# Patient Record
Sex: Male | Born: 1960 | Race: Black or African American | Hispanic: No | Marital: Single | Smoking: Former smoker
Health system: Southern US, Community
[De-identification: ages and names within clinical notes are randomized; demographics above are authoritative.]

## PROBLEM LIST (undated history)

## (undated) DIAGNOSIS — K644 Residual hemorrhoidal skin tags: Secondary | ICD-10-CM

## (undated) DIAGNOSIS — Z9189 Other specified personal risk factors, not elsewhere classified: Secondary | ICD-10-CM

## (undated) DIAGNOSIS — C61 Malignant neoplasm of prostate: Secondary | ICD-10-CM

## (undated) DIAGNOSIS — J45990 Exercise induced bronchospasm: Secondary | ICD-10-CM

## (undated) DIAGNOSIS — Z8614 Personal history of Methicillin resistant Staphylococcus aureus infection: Secondary | ICD-10-CM

## (undated) DIAGNOSIS — J309 Allergic rhinitis, unspecified: Secondary | ICD-10-CM

## (undated) DIAGNOSIS — E119 Type 2 diabetes mellitus without complications: Secondary | ICD-10-CM

## (undated) DIAGNOSIS — K219 Gastro-esophageal reflux disease without esophagitis: Secondary | ICD-10-CM

## (undated) DIAGNOSIS — M1990 Unspecified osteoarthritis, unspecified site: Secondary | ICD-10-CM

## (undated) HISTORY — PX: SHOULDER ARTHROSCOPY: SHX128

## (undated) HISTORY — PX: FOOT SURGERY: SHX648

## (undated) HISTORY — PX: INGUINAL HERNIA REPAIR: SUR1180

---

## 1999-04-20 ENCOUNTER — Inpatient Hospital Stay (HOSPITAL_COMMUNITY): Admission: EM | Admit: 1999-04-20 | Discharge: 1999-04-22 | Payer: Self-pay | Admitting: Emergency Medicine

## 1999-04-20 ENCOUNTER — Encounter: Payer: Self-pay | Admitting: Orthopedic Surgery

## 1999-04-20 ENCOUNTER — Encounter: Payer: Self-pay | Admitting: Emergency Medicine

## 2000-05-14 ENCOUNTER — Emergency Department (HOSPITAL_COMMUNITY): Admission: EM | Admit: 2000-05-14 | Discharge: 2000-05-14 | Payer: Self-pay | Admitting: Emergency Medicine

## 2001-11-26 ENCOUNTER — Encounter: Payer: Self-pay | Admitting: Emergency Medicine

## 2001-11-26 ENCOUNTER — Emergency Department (HOSPITAL_COMMUNITY): Admission: EM | Admit: 2001-11-26 | Discharge: 2001-11-26 | Payer: Self-pay | Admitting: Emergency Medicine

## 2002-01-17 ENCOUNTER — Emergency Department (HOSPITAL_COMMUNITY): Admission: EM | Admit: 2002-01-17 | Discharge: 2002-01-17 | Payer: Self-pay | Admitting: Emergency Medicine

## 2002-07-08 ENCOUNTER — Emergency Department (HOSPITAL_COMMUNITY): Admission: EM | Admit: 2002-07-08 | Discharge: 2002-07-08 | Payer: Self-pay | Admitting: *Deleted

## 2004-03-06 ENCOUNTER — Emergency Department (HOSPITAL_COMMUNITY): Admission: EM | Admit: 2004-03-06 | Discharge: 2004-03-06 | Payer: Self-pay | Admitting: Emergency Medicine

## 2006-02-23 ENCOUNTER — Emergency Department (HOSPITAL_COMMUNITY): Admission: EM | Admit: 2006-02-23 | Discharge: 2006-02-23 | Payer: Self-pay | Admitting: Emergency Medicine

## 2006-04-02 ENCOUNTER — Emergency Department (HOSPITAL_COMMUNITY): Admission: EM | Admit: 2006-04-02 | Discharge: 2006-04-02 | Payer: Self-pay | Admitting: Emergency Medicine

## 2007-08-19 ENCOUNTER — Emergency Department (HOSPITAL_COMMUNITY): Admission: EM | Admit: 2007-08-19 | Discharge: 2007-08-19 | Payer: Self-pay | Admitting: Emergency Medicine

## 2008-08-10 ENCOUNTER — Emergency Department (HOSPITAL_COMMUNITY): Admission: EM | Admit: 2008-08-10 | Discharge: 2008-08-10 | Payer: Self-pay | Admitting: Emergency Medicine

## 2008-08-24 ENCOUNTER — Emergency Department (HOSPITAL_COMMUNITY): Admission: EM | Admit: 2008-08-24 | Discharge: 2008-08-24 | Payer: Self-pay | Admitting: Emergency Medicine

## 2009-04-10 ENCOUNTER — Emergency Department (HOSPITAL_COMMUNITY): Admission: EM | Admit: 2009-04-10 | Discharge: 2009-04-10 | Payer: Self-pay | Admitting: Family Medicine

## 2009-05-07 ENCOUNTER — Ambulatory Visit: Payer: Self-pay | Admitting: Physician Assistant

## 2009-05-07 DIAGNOSIS — R0989 Other specified symptoms and signs involving the circulatory and respiratory systems: Secondary | ICD-10-CM

## 2009-05-07 DIAGNOSIS — R0609 Other forms of dyspnea: Secondary | ICD-10-CM

## 2009-05-07 DIAGNOSIS — K625 Hemorrhage of anus and rectum: Secondary | ICD-10-CM | POA: Insufficient documentation

## 2009-05-07 DIAGNOSIS — K439 Ventral hernia without obstruction or gangrene: Secondary | ICD-10-CM | POA: Insufficient documentation

## 2009-05-07 LAB — CONVERTED CEMR LAB: OCCULT 1: NEGATIVE

## 2009-05-08 ENCOUNTER — Encounter: Payer: Self-pay | Admitting: Physician Assistant

## 2009-05-08 LAB — CONVERTED CEMR LAB
ALT: 51 units/L (ref 0–53)
AST: 33 units/L (ref 0–37)
Albumin: 4.4 g/dL (ref 3.5–5.2)
Alkaline Phosphatase: 61 units/L (ref 39–117)
Amphetamine Screen, Ur: NEGATIVE
Barbiturate Quant, Ur: NEGATIVE
Basophils Absolute: 0 10*3/uL (ref 0.0–0.1)
Basophils Relative: 0 % (ref 0–1)
Cocaine Metabolites: NEGATIVE
Glucose, Bld: 93 mg/dL (ref 70–99)
Hemoglobin: 15.3 g/dL (ref 13.0–17.0)
Lymphocytes Relative: 40 % (ref 12–46)
Marijuana Metabolite: NEGATIVE
Monocytes Absolute: 0.7 10*3/uL (ref 0.1–1.0)
Neutro Abs: 4 10*3/uL (ref 1.7–7.7)
Neutrophils Relative %: 49 % (ref 43–77)
Opiate Screen, Urine: NEGATIVE
Phencyclidine (PCP): NEGATIVE
Potassium: 4.2 meq/L (ref 3.5–5.3)
RDW: 12.4 % (ref 11.5–15.5)
Sodium: 139 meq/L (ref 135–145)
Total Bilirubin: 0.4 mg/dL (ref 0.3–1.2)
Total Protein: 7.2 g/dL (ref 6.0–8.3)

## 2009-05-11 ENCOUNTER — Encounter: Payer: Self-pay | Admitting: Physician Assistant

## 2009-05-11 ENCOUNTER — Ambulatory Visit (HOSPITAL_COMMUNITY): Admission: RE | Admit: 2009-05-11 | Discharge: 2009-05-11 | Payer: Self-pay | Admitting: Internal Medicine

## 2009-05-28 ENCOUNTER — Ambulatory Visit: Payer: Self-pay | Admitting: Physician Assistant

## 2009-06-06 ENCOUNTER — Encounter (INDEPENDENT_AMBULATORY_CARE_PROVIDER_SITE_OTHER): Payer: Self-pay | Admitting: Internal Medicine

## 2009-06-06 ENCOUNTER — Ambulatory Visit: Payer: Self-pay | Admitting: Cardiovascular Disease

## 2009-06-06 ENCOUNTER — Ambulatory Visit (HOSPITAL_COMMUNITY): Admission: RE | Admit: 2009-06-06 | Discharge: 2009-06-06 | Payer: Self-pay | Admitting: Internal Medicine

## 2009-06-06 HISTORY — PX: TRANSTHORACIC ECHOCARDIOGRAM: SHX275

## 2009-06-07 ENCOUNTER — Encounter: Payer: Self-pay | Admitting: Physician Assistant

## 2009-06-07 ENCOUNTER — Telehealth: Payer: Self-pay | Admitting: Physician Assistant

## 2009-06-12 ENCOUNTER — Encounter (INDEPENDENT_AMBULATORY_CARE_PROVIDER_SITE_OTHER): Payer: Self-pay | Admitting: *Deleted

## 2009-06-22 ENCOUNTER — Encounter: Payer: Self-pay | Admitting: Physician Assistant

## 2009-08-02 ENCOUNTER — Encounter: Payer: Self-pay | Admitting: Physician Assistant

## 2009-08-02 ENCOUNTER — Ambulatory Visit (HOSPITAL_COMMUNITY): Admission: RE | Admit: 2009-08-02 | Discharge: 2009-08-02 | Payer: Self-pay | Admitting: Gastroenterology

## 2009-08-06 ENCOUNTER — Ambulatory Visit: Payer: Self-pay | Admitting: Physician Assistant

## 2009-08-06 DIAGNOSIS — E119 Type 2 diabetes mellitus without complications: Secondary | ICD-10-CM

## 2009-08-06 DIAGNOSIS — R81 Glycosuria: Secondary | ICD-10-CM | POA: Insufficient documentation

## 2009-08-06 LAB — CONVERTED CEMR LAB
Blood in Urine, dipstick: NEGATIVE
Hgb A1c MFr Bld: 7.1 %
Ketones, urine, test strip: NEGATIVE
Nitrite: NEGATIVE
Protein, U semiquant: NEGATIVE
Specific Gravity, Urine: >=1.03
WBC Urine, dipstick: NEGATIVE

## 2009-08-08 LAB — CONVERTED CEMR LAB
Cholesterol: 151 mg/dL (ref 0–200)
Microalb, Ur: 1.12 mg/dL (ref 0.00–1.89)
Total CHOL/HDL Ratio: 2.9
Triglycerides: 82 mg/dL (ref <150)
VLDL: 16 mg/dL (ref 0–40)

## 2009-08-21 ENCOUNTER — Ambulatory Visit: Payer: Self-pay | Admitting: Physician Assistant

## 2009-09-10 ENCOUNTER — Encounter: Payer: Self-pay | Admitting: Physician Assistant

## 2009-09-13 ENCOUNTER — Ambulatory Visit: Payer: Self-pay | Admitting: Physician Assistant

## 2009-09-17 ENCOUNTER — Telehealth: Payer: Self-pay | Admitting: Physician Assistant

## 2009-09-17 ENCOUNTER — Ambulatory Visit: Payer: Self-pay | Admitting: Physician Assistant

## 2009-09-18 DIAGNOSIS — R972 Elevated prostate specific antigen [PSA]: Secondary | ICD-10-CM

## 2009-09-18 LAB — CONVERTED CEMR LAB: PSA: 4.55 ng/mL — ABNORMAL HIGH (ref 0.10–4.00)

## 2009-11-01 ENCOUNTER — Telehealth: Payer: Self-pay | Admitting: Physician Assistant

## 2009-11-06 ENCOUNTER — Ambulatory Visit: Payer: Self-pay | Admitting: Physician Assistant

## 2009-11-06 DIAGNOSIS — R030 Elevated blood-pressure reading, without diagnosis of hypertension: Secondary | ICD-10-CM

## 2009-11-06 DIAGNOSIS — B3749 Other urogenital candidiasis: Secondary | ICD-10-CM

## 2009-11-06 DIAGNOSIS — B351 Tinea unguium: Secondary | ICD-10-CM | POA: Insufficient documentation

## 2009-11-06 LAB — CONVERTED CEMR LAB
Bilirubin Urine: NEGATIVE
Glucose, Urine, Semiquant: NEGATIVE
Ketones, urine, test strip: NEGATIVE
Specific Gravity, Urine: 1.025
Urobilinogen, UA: 0.2
pH: 5.5

## 2009-11-07 LAB — CONVERTED CEMR LAB
Calcium: 9.4 mg/dL (ref 8.4–10.5)
GC Probe Amp, Urine: NEGATIVE
Hgb A1c MFr Bld: 7 % — ABNORMAL HIGH (ref <5.7)
PSA: 4.52 ng/mL — ABNORMAL HIGH (ref 0.10–4.00)
Potassium: 4.4 meq/L (ref 3.5–5.3)
Sodium: 138 meq/L (ref 135–145)

## 2009-11-09 ENCOUNTER — Encounter: Payer: Self-pay | Admitting: Physician Assistant

## 2010-01-04 ENCOUNTER — Ambulatory Visit: Payer: Self-pay | Admitting: Internal Medicine

## 2010-01-07 ENCOUNTER — Ambulatory Visit: Payer: Self-pay | Admitting: Internal Medicine

## 2010-01-21 ENCOUNTER — Encounter (INDEPENDENT_AMBULATORY_CARE_PROVIDER_SITE_OTHER): Payer: Self-pay | Admitting: Nurse Practitioner

## 2010-03-04 ENCOUNTER — Ambulatory Visit: Admit: 2010-03-04 | Payer: Self-pay | Admitting: Nurse Practitioner

## 2010-03-28 NOTE — Letter (Signed)
Summary: Handout Printed  Printed Handout:  - Diabetes, Adult, Type II

## 2010-03-28 NOTE — Op Note (Signed)
Summary: Operative Report  Operative Report   Imported By: Arta Bruce 09/11/2009 15:38:04  _____________________________________________________________________  External Attachment:    Type:   Image     Comment:   External Document

## 2010-03-28 NOTE — Letter (Signed)
Summary: PFT'S  PFT'S   Imported By: Arta Bruce 05/29/2009 11:50:08  _____________________________________________________________________  External Attachment:    Type:   Image     Comment:   External Document

## 2010-03-28 NOTE — Letter (Signed)
Summary: *HSN Results Follow up  [facility]  [address] [place], [place] [address]   Phone: [phone]  Fax: [phone]      05/08/2009   Cristian Welch [address] [place], [place]  [address]   Dear  [name],                            ____S.Drinkard,FNP   ____D. Gore,FNP       ____B. McPherson,MD   ____V. Rankins,MD    ____E. Mulberry,MD    ____N. Daphine Deutscher, FNP  ____D. Reche Dixon, MD    ____K. Philipp Deputy, MD    __x__S. Alben Spittle, PA-C     This letter is to inform you that your recent test(s):  _______Pap Smear    ___x____Lab Test     _______X-ray    ___x____ is within acceptable limits  _______ requires a medication change  _______ requires a follow-up lab visit  _______ requires a follow-up visit with your provider   Comments:       _________________________________________________________ If you have any questions, please contact our office                     Sincerely,  Tereso Newcomer PA-C [facility]

## 2010-03-28 NOTE — Miscellaneous (Signed)
  Clinical Lists Changes  Observations: Added new observation of DIAB EYE EX: Retasure Normal (09/13/2009 14:57)

## 2010-03-28 NOTE — Letter (Signed)
Summary: PODIATRY  PODIATRY   Imported By: Arta Bruce 03/12/2010 10:46:14  _____________________________________________________________________  External Attachment:    Type:   Image     Comment:   External Document

## 2010-03-28 NOTE — Progress Notes (Signed)
Summary: UROLOGY REFERRAL   Phone Note Outgoing Call   Summary of Call: I SPOKE TO Cristian Welch ABOUT HIS APPT 11-14-09 @ 1:30PM [facility] . PT SAID THAT HE DON'T HAVE A TRANSPORTATION TO GO THERE AND HE IS NOT GOING . PT HAVE AN APPT WITH YOU 11-06-09 @ 10 AM CAN YOU PLEASE DISCUSS THAT WITH HIM AND LET ME KNOW SO I CAN CANCELED HIS APPT THANK YOU  Initial call taken by: Cheryll Dessert,  November 01, 2009 3:50 PM  Follow-up for Phone Call        [place], Please tell him about:  PART. This is a bus system that goes between [place], [place]. He can probably get to [facility] with this. If you type PART in to [employer], it should come up. I recommend he call and speak to someone to figure out how to get there. Tereso Newcomer PA-C  November 02, 2009 1:12 PM   Additional Follow-up for Phone Call Additional follow up Details #1::        pt is aware and will get information Additional Follow-up by: [name],  November 02, 2009 2:10 PM

## 2010-03-28 NOTE — Letter (Signed)
Summary: NUTRITION SUMMARY//SUSIE  NUTRITION SUMMARY//SUSIE   Imported By: Arta Bruce 09/03/2009 16:39:14  _____________________________________________________________________  External Attachment:    Type:   Image     Comment:   External Document

## 2010-03-28 NOTE — Progress Notes (Signed)
Summary: Office Visit//DEPRESSION SCREENING  Office Visit//DEPRESSION SCREENING   Imported By: Arta Bruce 08/14/2009 12:19:02  _____________________________________________________________________  External Attachment:    Type:   Image     Comment:   External Document

## 2010-03-28 NOTE — Letter (Signed)
Summary: Handout Printed  Printed Handout:  - Diet - High-Fiber 

## 2010-03-28 NOTE — Progress Notes (Signed)
Summary: Cardiology Referral  Phone Note Outgoing Call   Summary of Call: Patient needs referral to cardiology for dyspnea with exertion. Initial call taken by: Tereso Newcomer PA-C,  June 07, 2009 1:44 PM       Impression & Recommendations:  Problem # 1:  DYSPNEA ON EXERTION (ICD-786.09)  His updated medication list for this problem includes:    Proventil Hfa 108 (90 Base) Mcg/act Aers (Albuterol sulfate) .Marland Kitchen... 1-2 puffs every 4-6 hours as needed  Orders: Cardiology Referral (Cardiology)  Complete Medication List: 1)  Proventil Hfa 108 (90 Base) Mcg/act Aers (Albuterol sulfate) .Marland Kitchen.. 1-2 puffs every 4-6 hours as needed 2)  Proctosol Hc 2.5 % Crea (Hydrocortisone) .... Apply two times a day after bowel movement as needed

## 2010-03-28 NOTE — Assessment & Plan Note (Signed)
Summary: Physical exam///gk   Vital Signs:  Patient profile:   50 year old male Height:      69 inches Weight:      244 pounds Temp:     97.8 degrees F oral Pulse rate:   77 / minute Pulse rhythm:   regular Resp:     20 per minute BP sitting:   125 / 86  (left arm) Cuff size:   large  Vitals Entered By: [name] (August 06, 2009 11:17 AM) CC: Patient has had colonscopy, patient went to appt. with Card.  Is Patient Diabetic? No Pain Assessment Patient in pain? no       Does patient need assistance? Functional Status Self care Ambulation Normal   Primary Care Provider:  Tereso Newcomer PA-C  CC:  Patient has had colonscopy and patient went to appt. with Card. .  History of Present Illness: Here for CPE.  Rectal bleeding:  Had colo last week.  It was normal.  Dyspnea:  Saw cardiologist last month.  Says he had stress test.  Sounds like he had a nuclear study.  No records received.  Sounds like it was normal.  Of note, had normal PFTs, cxr and Echo as part of his workup.  Suspect deconditioning and weight gain.   Health Maint: Just had colo as above. Needs lipids checked. Discussed PSA and he wants checked.  No urinary c/o's at this time. PHQ9=0 today. Notes exposure to the sun.  Problems Prior to Update: 1)  Diabetes Mellitus, Type II  (ICD-250.00) 2)  Glycosuria  (ICD-791.5) 3)  Preventive Health Care  (ICD-V70.0) 4)  Ventral Hernia  (ICD-553.20) 5)  Rectal Bleeding  (ICD-569.3) 6)  Dyspnea On Exertion  (ICD-786.09) 7)  Family History Diabetes 1st Degree Relative  (ICD-V18.0)  Allergies: No Known Drug Allergies  Past History:  Past Medical History: unremarkable PFTs 05/11/2009:  FEV1 89; FEV1/FVC 78 % ("Normal airflows.  No significant response to bronchodilators") Echo 05/2009:  EF 55-60% Colo 07/2009: normal Stress test 06/2009 (results unkown 08/06/2009)  Family History: Family History Diabetes 1st degree relative - mom  Bro and Sis with Cancer No  CAD in family. Uncle had Prostate cancer  Social History: Reviewed history from 05/07/2009 and no changes required. 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 CV:  Denies chest pain or discomfort and shortness of breath with exertion. Resp:  Denies cough. GI:  Denies bloody stools and dark tarry stools. GU:  Denies hematuria, nocturia, and urinary frequency. MS:  Denies joint pain. Neuro:  Denies headaches. Psych:  Denies suicidal thoughts/plans. Endo:  Denies excessive urination.  Physical Exam  General:  alert, well-developed, and well-nourished.   Head:  normocephalic and atraumatic.   Eyes:  pupils equal, pupils round, pupils reactive to light, and no optic disk abnormalities.   Ears:  R ear normal and L ear normal.   Nose:  no external deformity.   Mouth:  pharynx pink and moist.   Neck:  supple, no carotid bruits, and no cervical lymphadenopathy.   Chest Wall:  no deformities.   Lungs:  normal breath sounds, no crackles, and no wheezes.   Heart:  normal rate, regular rhythm, and no murmur.   Abdomen:  soft, non-tender, normal  bowel sounds, and no hepatomegaly.   Rectal:  no external abnormalities, no hemorrhoids, normal sphincter tone, no masses, no tenderness, no fissures, no fistulae, and no perianal rash.   Genitalia:  uncircumcised, no hydrocele, no varicocele, no scrotal masses, no testicular masses or atrophy, no cutaneous lesions, and no urethral discharge.   Prostate:  no nodules, no asymmetry, no induration, and 1+ enlarged.   Msk:  normal ROM.   Extremities:  no edema Neurologic:  alert & oriented X3 and cranial nerves II-XII intact.   Skin:  turgor normal.   Psych:  normally interactive and good eye contact.     Impression &  Recommendations:  Problem # 1:  GLYCOSURIA (ICD-791.5)  Orders: Capillary Blood Glucose/CBG (04540) Hemoglobin A1C (98119)  Problem # 2:  DIABETES MELLITUS, TYPE II (ICD-250.00)  new dx discussed complications of diabetes and indications for treatment handout given all questions answered suggest he start metformin now will start metformin 500 mg once daily  arrange appt with dietician schedule retasure  f/u in 3 months  His updated medication list for this problem includes:    Metformin Hcl 500 Mg Xr24h-tab (Metformin hcl) .Marland Kitchen... Take 1 tablet by mouth once a day for diabetes  Orders: Hemoglobin A1C (83036) T-Urine Microalbumin w/creat. ratio [phone])  Problem # 3:  PREVENTIVE HEALTH CARE (ICD-V70.0) check lipids - goal LDL < 100 check PSA PHQ9=0  Orders: T-Lipid Profile (0011001100) T-PSA (57846-96295)  Problem # 4:  DYSPNEA ON EXERTION (ICD-786.09) workup completed no pulmonary or cardiac issue contributing suggest he increase activity  His updated medication list for this problem includes:    Proventil Hfa 108 (90 Base) Mcg/act Aers (Albuterol sulfate) .Marland Kitchen... 1-2 puffs every 4-6 hours as needed  Complete Medication List: 1)  Proventil Hfa 108 (90 Base) Mcg/act Aers (Albuterol sulfate) .Marland Kitchen.. 1-2 puffs every 4-6 hours as needed 2)  Proctosol Hc 2.5 % Crea (Hydrocortisone) .... Apply two times a day after bowel movement as needed 3)  Metformin Hcl 500 Mg Xr24h-tab (Metformin hcl) .... Take 1 tablet by mouth once a day for diabetes 4)  Glucose Meter of Choice  .... Check sugar once daily 5)  Glucose Meter Strips  .... Check sugar once daily 6)  Lancets  .... Check sugar once daily  Patient Instructions: 1)  Call about flu shot in  2)  Start Metformin for diabetes.  Take once daily with food. 3)  Schedule appointment with Susie Piper (new diabetic). 4)  Get glucose meter at [facility]. pharmacy. 5)  Check sugar once a day before breakfast.   Check if you feel bad (nauseated, sweaty, heart racing).  This could mean your sugar is low.  If low (60-70 or lower), eat something sweet and recheck 20 minutes later. 6)  Schedule retasure at [facility]. 7)  Please schedule a follow-up appointment in 3 months with Ronen Bromwell for diabetes.  Prescriptions: LANCETS check sugar once daily  #1 mo supply x 11   Entered and Authorized by:   Tereso Newcomer PA-C   Signed by:   Tereso Newcomer PA-C on 08/06/2009   Method used:   Print then Give to Patient   RxID:   [id number] GLUCOSE METER STRIPS check sugar once daily  #1 mo supply x 11   Entered and Authorized by:   Tereso Newcomer PA-C   Signed by:   Tereso Newcomer PA-C on 08/06/2009   Method used:   Print then Give to Patient   RxID:   [id number] GLUCOSE  METER OF CHOICE check sugar once daily  #1 machine x 0   Entered and Authorized by:   Tereso Newcomer PA-C   Signed by:   Tereso Newcomer PA-C on 08/06/2009   Method used:   Print then Give to Patient   RxID:   [id number] METFORMIN HCL 500 MG XR24H-TAB (METFORMIN HCL) Take 1 tablet by mouth once a day for diabetes  #30 x 5   Entered and Authorized by:   Tereso Newcomer PA-C   Signed by:   Tereso Newcomer PA-C on 08/06/2009   Method used:   Print then Give to Patient   RxID:   [id number]   Laboratory Results   Urine Tests    Routine Urinalysis   Color: yellow Appearance: Clear Glucose: 100   (Normal Range: Negative) Bilirubin: negative   (Normal Range: Negative) Ketone: negative   (Normal Range: Negative) Spec. Gravity: >=1.030   (Normal Range: 1.003-1.035) Blood: negative   (Normal Range: Negative) pH: 5.0   (Normal Range: 5.0-8.0) Protein: negative   (Normal Range: Negative) Urobilinogen: 0.2   (Normal Range: 0-1) Nitrite: negative   (Normal Range: Negative) Leukocyte Esterace: negative   (Normal Range: Negative)     Blood Tests     Glucose (random): 78 mg/dL   (Normal Range: 42-595) HGBA1C: 7.1%   (Normal Range:  Non-Diabetic - 3-6%   Control Diabetic - 6-8%)       Colonoscopy  Procedure date:  08/02/2009  Findings:       Results: Normal. Location:  [facility].  Dr. Evette Cristal   Comments:      Repeat colonoscopy in 10 years.

## 2010-03-28 NOTE — Assessment & Plan Note (Signed)
Summary: first est care/ hernia/ shortness of breath/gk   Vital Signs:  Patient profile:   50 year old male Height:      69 inches Weight:      251 pounds BMI:     37.20 Temp:     97.9 degrees F oral Pulse rate:   93 / minute Pulse rhythm:   regular Resp:     18 per minute BP sitting:   125 / 84  (left arm) Cuff size:   regular  Vitals Entered By: [name] (May 07, 2009 3:15 PM) CC: NP...Marland KitchenMarland Kitchen pt says he thinks he has a ulcer...pt says he gets SOB when he does little things especially if he has to been down and he thinks thats related to the ucler he has in stomach.. . pt wants a cpe.... Is Patient Diabetic? No Pain Assessment Patient in pain? no       Does patient need assistance? Functional Status Self care Ambulation Normal   Primary Care Provider:  Tereso Newcomer PA-C  CC:  NP...Marland KitchenMarland Kitchen pt says he thinks he has a ulcer...pt says he gets SOB when he does little things especially if he has to been down and he thinks thats related to the ucler he has in stomach.. . pt wants a cpe.....  History of Present Illness: New patient.  Hernia:  Noted ventral hernia 4-6 mos ago.  Seems to be getting worse.  No nausea or vomiting.  No difficulty in passing stools.  + flatus  SOB:  Noted over last 6 mos or so.  Gets short of breath and has to stop after walking.  NO chest pain.  NO arm or jaw pain.  No diaph.  NO nausea.  No PND or orthopnea.  No edema.  No syncope.  He has a remote h/o smoking.  Also, h/o crack cocaine abuse.  Does note wheezing.  No significant cough.  No sputum production.    BRBPR:  Has hemorrhoids.  Notes burning and itching.  Has scant amount of blood on tissue at times with this.  Never had a colonoscopy.  NO melena.  No gross hematochezia.   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 No dysuria or penile lesions. Rest of ROS neg.  Physical Exam  General:  alert, well-developed, and well-nourished.   Head:  normocephalic and atraumatic.   Eyes:  pupils equal, pupils round, and pupils reactive to light.   Neck:  supple, no thyromegaly, no JVD, no carotid bruits, and no cervical lymphadenopathy.   Lungs:  normal breath sounds, no crackles, and no wheezes.   Heart:  normal rate, regular rhythm, and no murmur.   Abdomen:  soft, non-tender, and no hepatomegaly.   ventral hernia noted  Rectal:  normal sphincter tone, no masses, no tenderness, no fissures, no fistulae,  no perianal rash, and external hemorrhoid(s).   Prostate:  no gland enlargement, no nodules, no asymmetry, and no induration.   Extremities:  no edema Neurologic:  alert & oriented X3 and cranial nerves II-XII intact.   Skin:  turgor normal.   Psych:  normally interactive and not anxious appearing.     Impression & Recommendations:  Problem # 1:  Preventive Health Care (ICD-V70.0)  PHQ9=5 . . . when asked patient states he is worried about health; not depressed; denies suicidal ideations; does not want to see counselor  Orders: T-CBC w/Diff [phone]) T-Comprehensive Metabolic Panel [id number]) T-TSH ([phone]) T-Drug Screen-Urine, (single) [phone]) T-HIV Antibody  (Reflex) (28413-24401) T-Syphilis Test (RPR) (02725-36644)  Problem # 2:  DYSPNEA ON EXERTION (ICD-786.09)  ? etiology poss  COPD cannot exclude cardiac ischemia . .  .will check ECG check cxr check echo check PFTs trial of proventil as needed close f/u  His updated medication list for this problem includes:    Proventil Hfa 108 (90 Base) Mcg/act Aers (Albuterol sulfate) .Marland Kitchen... 1-2 puffs every 4-6 hours as needed  Orders: EKG w/ Interpretation (93000) Diagnostic X-Ray/Fluoroscopy (Diagnostic X-Ray/Flu) PFT Baseline-Pre/Post Bronchodiolator (PFT Baseline-Pre/Pos) 2 D Echo (2 D Echo) T-Comprehensive Metabolic Panel ([id number]) T-TSH (38756-43329) T-BNP  (B Natriuretic Peptide) (51884-16606)  Problem # 3:  RECTAL BLEEDING (ICD-569.3)  heme neg today has hemorrhoids on exam and likely cause of noted blood patient is 48 and should have colo done will give proctosol for hemorrhoids and refer to GI for screening colo  Orders: Hemoccult Guaiac-1 spec.(in office) (82270) T-CBC w/Diff (30160-10932) Gastroenterology Referral (GI)  Problem # 4:  VENTRAL HERNIA (ICD-553.20) focus on other issues for now if he desires repair in future, consider making referral  Complete Medication List: 1)  Proventil Hfa 108 (90 Base) Mcg/act Aers (Albuterol sulfate) .Marland Kitchen.. 1-2 puffs every 4-6 hours as needed 2)  Proctosol Hc 2.5 % Crea (Hydrocortisone) .... Apply two times a day after bowel movement as needed  Patient Instructions: 1)  Please schedule a follow-up appointment in 3 weeks with Tereka Thorley to follow up on tests.  Prescriptions: PROCTOSOL HC 2.5 % CREA (HYDROCORTISONE) apply two times a day after bowel movement as needed  #1 box x 3   Entered and Authorized by:   Tereso Newcomer PA-C   Signed by:   Tereso Newcomer PA-C on 05/07/2009   Method used:   Print then Give to Patient   RxID:   [id number] PROVENTIL HFA 108 (90 BASE) MCG/ACT AERS (ALBUTEROL SULFATE) 1-2 puffs every 4-6 hours as needed  #1 x 3   Entered and Authorized by:   Tereso Newcomer PA-C   Signed by:   Tereso Newcomer PA-C on 05/07/2009   Method used:    Print then Give to Patient   RxID:   [id number]   Laboratory Results    Stool - Occult Blood Hemmoccult #1: negative Date: 05/07/2009     EKG  Procedure date:  05/07/2009  Findings:      Normal sinus rhythm with rate of:  89 normal axis no isch changes

## 2010-03-28 NOTE — Assessment & Plan Note (Signed)
Summary: 3 week fu////kt   Vital Signs:  Patient profile:   50 year old male Weight:      251.25 pounds BMI:     37.24 Temp:     98.3 degrees F Pulse rate:   91 / minute Pulse rhythm:   regular Resp:     20 per minute BP sitting:   125 / 87  (left arm) Cuff size:   regular  Vitals Entered By: Chauncy Passy, SMA CC: Pt. is here f/u for a 3 week f/u.  Is Patient Diabetic? No Pain Assessment Patient in pain? no       Does patient need assistance? Functional Status Self care Ambulation Normal   Primary Care Provider:  Tereso Newcomer PA-C  CC:  Pt. is here f/u for a 3 week f/u. Marland Kitchen  History of Present Illness: Here for f/u.  Rectal bleeding:  Saw Dr. Evette Cristal.  Has colo arranged in June.  Has seen a little more bleeding since last visit.  Just on tissue.  Notes blood when he strains.  Dyspnea:  Started 6 mos ago.  Notes DOE.  NYHA Class 2.  No orthopnea or PND.  Has occ chest pain unrelated to exertion.  No jaw or arm pain.  No syncope.  No nausea or diaphoresis. Never go CXR or echo done. PFTs with normal airflows and no response to bronchodilator. Exsmoker.  No FHx of CAD that he knows of.   Current Medications (verified): 1)  Proventil Hfa 108 (90 Base) Mcg/act Aers (Albuterol Sulfate) .Marland Kitchen.. 1-2 Puffs Every 4-6 Hours As Needed 2)  Proctosol Hc 2.5 % Crea (Hydrocortisone) .... Apply Two Times A Day After Bowel Movement As Needed  Allergies (verified): No Known Drug Allergies  Past History:  Past Medical History: unremarkable PFTs 05/11/2009:  FEV1 89; FEV1/FVC 78 % ("Normal airflows.  No significant response to bronchodilators")  Family History: Family History Diabetes 1st degree relative - mom  Bro and Sis with Cancer No CAD in family.  Physical Exam  General:  alert, well-developed, and well-nourished.   Head:  normocephalic and atraumatic.   Neck:  supple.   Lungs:  normal breath sounds.   Heart:  normal rate and regular rhythm.   Abdomen:  soft.     Extremities:  no edema  Neurologic:  alert & oriented X3 and cranial nerves II-XII intact.   Psych:  normally interactive.     Impression & Recommendations:  Problem # 1:  DYSPNEA ON EXERTION (ICD-786.09) PFTs normal I was surprised by this given his h/o smoking I suspect his DOE is related to his wt gain CRFs are minimal but he has a significant h/o smoking WIll send to card to consider ETT to r/o ischemia as a cause Echo and CXR still pending  . . . pt did not know to get them done never got inhaler with normal PFTs, may not need it however, he can have on hand for poss of mild reactive airway  His updated medication list for this problem includes:    Proventil Hfa 108 (90 Base) Mcg/act Aers (Albuterol sulfate) .Marland Kitchen... 1-2 puffs every 4-6 hours as needed  Problem # 2:  RECTAL BLEEDING (ICD-569.3) colo pending  Complete Medication List: 1)  Proventil Hfa 108 (90 Base) Mcg/act Aers (Albuterol sulfate) .Marland Kitchen.. 1-2 puffs every 4-6 hours as needed 2)  Proctosol Hc 2.5 % Crea (Hydrocortisone) .... Apply two times a day after bowel movement as needed  Patient Instructions: 1)  Please get the  chest xray and echocardiogram done. 2)  Eat more fiber. 3)  See the handout. 4)  Someone will call you to set up a visit with the heart doctor for your shortness of breath. 5)  Td shot today. 6)  Schedule follow up with Bobi Daudelin in 2 months for CPE. 7)  Come fasting for labs (nothing to eat or drink after midnight except water). 8)  Make sure you go to billing at [facility] to sign up for your discount.

## 2010-03-28 NOTE — Progress Notes (Signed)
Summary: medical ?  Phone Note Call from Patient   Summary of Call: Pt has been working outside lately and the heat has been really bothering him and he wonders if it is a side effect of any of his meds.  Pt can be reached at [phone]. Initial call taken by: Vesta Mixer CMA,  September 17, 2009 9:57 AM  Follow-up for Phone Call        The only thing I can see that he should be aware of with his medicines is that he could get into trouble with Metformin if he is severely dehydrated (like heat exhaustion or heat stroke).   If he feels poorly and starts to have a lot of muscle pain while working in the heat, he should go to the emergency room right away.  Otherwise, he should take frequent breaks (every 15 minutes) to cool off and drink plenty of water throughout the day.  Drink water when he rests every 15 minutes.  He does have diabetes and can potentially have more problems if his sugars are staying up or dropping too low.  Make sure he is eating regularly.  Schedule appt with me if he is concerned. Follow-up by: Tereso Newcomer PA-C,  September 17, 2009 10:10 AM  Additional Follow-up for Phone Call Additional follow up Details #1::        pt is aware Additional Follow-up by: [name],  September 17, 2009 4:36 PM

## 2010-03-28 NOTE — Miscellaneous (Signed)
Summary: colo done 6.2011   Clinical Lists Changes  Problems: Assessed RECTAL BLEEDING as comment only - colo in 6.2011 normal redness noted in anorectal jxn bleeding may have been related to irritation in this area Observations: Added new observation of PAST MED HX: unremarkable PFTs 05/11/2009:  FEV1 89; FEV1/FVC 78 % ("Normal airflows.  No significant response to bronchodilators") Echo 05/2009:  EF 55-60% Colo 07/2009: normal Stress test 06/2009 (results unkown 08/06/2009)  (09/10/2009 13:41) Added new observation of COLONRECACT: Repeat colonoscopy in 10 years.   (08/02/2009 13:42) Added new observation of COLONOSCOPY:  Results: Normal. Redness noted around anorectal junction.  Bleeding felt to be related to irritation in this area. (08/02/2009 13:42)      Colonoscopy  Procedure date:  08/02/2009  Findings:       Results: Normal. Redness noted around anorectal junction.  Bleeding felt to be related to irritation in this area.  Comments:      Repeat colonoscopy in 10 years.      Impression & Recommendations:  Problem # 1:  RECTAL BLEEDING (ICD-569.3) colo in 6.2011 normal redness noted in anorectal jxn bleeding may have been related to irritation in this area  Complete Medication List: 1)  Proventil Hfa 108 (90 Base) Mcg/act Aers (Albuterol sulfate) .Marland Kitchen.. 1-2 puffs every 4-6 hours as needed 2)  Proctosol Hc 2.5 % Crea (Hydrocortisone) .... Apply two times a day after bowel movement as needed 3)  Metformin Hcl 500 Mg Xr24h-tab (Metformin hcl) .... Take 1 tablet by mouth once a day for diabetes 4)  Glucose Meter of Choice  .... Check sugar once daily 5)  Glucose Meter Strips  .... Check sugar once daily 6)  Lancets  .... Check sugar once daily   Past History:  Past Medical History: unremarkable PFTs 05/11/2009:  FEV1 89; FEV1/FVC 78 % ("Normal airflows.  No significant response to bronchodilators") Echo 05/2009:  EF 55-60% Colo 07/2009: normal Stress test 06/2009  (results unkown 08/06/2009)

## 2010-03-28 NOTE — Letter (Signed)
Summary: *HSN Results Follow up  [facility]  [address] [place], [place] [address]   Phone: [phone]  Fax: [phone]      11/09/2009   [name] [address] [place], [place]  [address]   Dear  Mr. Cristian Welch,                            ____S.Drinkard,FNP   ____D. Gore,FNP       ____B. McPherson,MD   ____V. Rankins,MD    ____E. Mulberry,MD    ____N. Daphine Deutscher, FNP  ____D. Reche Dixon, MD    ____K. Philipp Deputy, MD    __x__S. Alben Spittle, PA-C    This letter is to inform you that your recent test(s):  _______Pap Smear    _______Lab Test     _______X-ray    _______ is within acceptable limits  _______ requires a medication change  _______ requires a follow-up lab visit  _______ requires a follow-up visit with your provider   Comments: Eye test for diabetes was normal.       _________________________________________________________ If you have any questions, please contact our office                     Sincerely,  Tereso Newcomer PA-C [facility]

## 2010-03-28 NOTE — Letter (Signed)
Summary: *HSN Results Follow up  [facility]  [address] [place], [place] [address]   Phone: [phone]  Fax: [phone]      06/12/2009   TANNAR BROKER [address] [place], [place]  [address]   Dear  Mr. Cristian Welch,                            ____S.Drinkard,FNP   ____D. Gore,FNP       ____B. McPherson,MD   ____V. Rankins,MD    ____E. Mulberry,MD    ____N. Daphine Deutscher, FNP  ____D. Reche Dixon, MD    ____K. Philipp Deputy, MD    ____Other     This letter is to inform you that your recent test(s):  _______Pap Smear    _______Lab Test     ___X____X-ray       ___X____Echo    ___X____ is within acceptable limits  _______ requires a medication change  _______ requires a follow-up lab visit  _______ requires a follow-up visit with your provider   Comments: Your chest x-ray and echo is normal.       _________________________________________________________ If you have any questions, please contact our office                     Sincerely,  [name] [facility]

## 2010-03-28 NOTE — Letter (Signed)
Summary: Handout Printed  Printed Handout:  - Diet - Low-Fat, Low-Saturated-Fat, Low-Cholesterol Diets 

## 2010-03-28 NOTE — Letter (Signed)
Summary: [facility]  [facility]   Imported By: Arta Bruce 11/12/2009 16:00:11  _____________________________________________________________________  External Attachment:    Type:   Image     Comment:   External Document

## 2010-03-28 NOTE — Letter (Signed)
Summary: MAILED REQUESTED RECORDS TO [facility]  MAILED REQUESTED RECORDS TO [facility]   Imported By: Arta Bruce 06/22/2009 11:32:16  _____________________________________________________________________  External Attachment:    Type:   Image     Comment:   External Document

## 2010-03-28 NOTE — Assessment & Plan Note (Signed)
Summary: 3 MONTH FU FOR DIABETES//KT   Vital Signs:  Patient profile:   50 year old male Height:      69 inches Weight:      248 pounds Temp:     9.7 degrees F Pulse rate:   78 / minute Pulse rhythm:   regular Resp:     2 per minute BP sitting:   148 / 102  (left arm) Cuff size:   regular  Vitals Entered By: CMA Student Kenyatta Jeffries  Serial Vital Signs/Assessments:  Time      Position  BP       Pulse  Resp  Temp     By 10:16 AM            122/88                         Tereso Newcomer PA-C  CC: follow-up visit DM, patient having outbreaks in genital area, medications verified, Hypertension Management Is Patient Diabetic? Yes Pain Assessment Patient in pain? no      CBG Result 122  Does patient need assistance? Functional Status Self care Ambulation Normal   Primary Care Provider:  Tereso Newcomer PA-C  CC:  follow-up visit DM, patient having outbreaks in genital area, medications verified, and Hypertension Management.  History of Present Illness: Here for f/u on diabetes. Following a good diet. No machine yet.  Cannot check sugars. Notes some numbness aroudn 3rd toe on left due to h/o old injury. Taking metformin. Not certain he can get to the urology appt due to transportation issues.  Explained importance of this to him today. Notes rash on head of penis.  + dysuria.  One sexual partner.  Tried over the counter cream and this made it burn.  Hypertension History:      Positive major cardiovascular risk factors include male age 52 years old or older and diabetes.  Negative major cardiovascular risk factors include non-tobacco-user status.     Current Medications (verified): 1)  Proventil Hfa 108 (90 Base) Mcg/act Aers (Albuterol Sulfate) .Marland Kitchen.. 1-2 Puffs Every 4-6 Hours As Needed 2)  Proctosol Hc 2.5 % Crea (Hydrocortisone) .... Apply Two Times A Day After Bowel Movement As Needed 3)  Metformin Hcl 500 Mg Xr24h-Tab (Metformin Hcl) .... Take 1 Tablet By Mouth Once  A Day For Diabetes 4)  Glucose Meter of Choice .... Check Sugar Once Daily 5)  Glucose Meter Strips .... Check Sugar Once Daily 6)  Lancets .... Check Sugar Once Daily  Allergies (verified): No Known Drug Allergies  Social History: Reviewed history from 05/07/2009 and no changes required. Occupation: unemployed tree climber Single 3 kids (girls) Former Smoker Previous drinker (quit ETOH 7 mos ago); usually a weekend drinker Previous crack use - none in years; admits to [facility] 4-5 mos ago  Physical Exam  General:  alert, well-developed, and well-nourished.   Head:  normocephalic and atraumatic.   Lungs:  normal breath sounds.   Heart:  normal rate and regular rhythm.   Genitalia:  erythema and scaling on glans c/w tinea no discharge no testicular masses no inguinal adenopathy Neurologic:  alert & oriented X3 and cranial nerves II-XII intact.   Psych:  normally interactive.    Diabetes Management Exam:    Foot Exam (with socks and/or shoes not present):       Sensory-Monofilament:          Left foot: normal  Right foot: normal       Inspection:          Left foot: abnormal             Comments: callus formation medial great toe          Right foot: abnormal             Comments: callus formation medial great toe and 3rd met head       Nails:          Left foot: fungal infection          Right foot: fungal infection   Impression & Recommendations:  Problem # 1:  ELEVATED BP READING WITHOUT DX HYPERTENSION (ICD-796.2) repeat by me ok monitor did have increased alcohol consumption this weekend counseled on this  Problem # 2:  DIABETES MELLITUS, TYPE II (ICD-250.00)  His updated medication list for this problem includes:    Metformin Hcl 500 Mg Xr24h-tab (Metformin hcl) .Marland Kitchen... 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 1-2 puffs every 4-6 hours as needed 2)  Proctosol Hc 2.5 % Crea (Hydrocortisone) .... Apply two times a day after bowel movement as needed 3)  Metformin Hcl 500 Mg Xr24h-tab (Metformin hcl) .... Take 1 tablet by mouth once a day for diabetes 4)  Glucose Meter of Choice  .... Check sugar once daily 5)  Glucose Meter Strips  .... Check sugar once daily 6)  Lancets  .... Check sugar once daily 7)  Clotrimazole 1 % Crea (Clotrimazole) .... Apply to irritated skin two times a day for one week 8)  Lamisil 250 Mg Tabs (Terbinafine hcl) .... Take 1 tablet by mouth once a day for 3 months  Other Orders: Pneumococcal Vaccine (06301) Admin 1st Vaccine (60109) Admin 1st Vaccine [facility]) [phone])  Hypertension Assessment/Plan:      The patient's hypertensive risk group is category C: Target organ damage and/or diabetes.  His calculated 10 year risk of coronary heart disease is 9 %.  Today's blood pressure is 148/102.    Patient Instructions: 1)  Schedule Td shot. 2)  Schedule Flu shot in 4-6 weeks. 3)  Please check with [facility] pharmacy on getting. Mr. Bou a glucose meter. 4)  Please call and ask to speak to Arna Medici on Thursday to let her know what you are going to do about seeing Urology at [facility] in [place].  We need to cancel your appt if you are not going.  If you just don't show up, they will not reschedule you. 5)  Make sure you retract your foreskin each day to clean around the head of your penis to prevent infection.  You can use soap and water  and make sure you clear away any debris you may see. 6)  You can do a sitz bath two times a day as well for comfort.  You sit in water warm enough to stand for 15 minutes. 7)  Use the clotrimazole two  times a day for a week.   8)  The Lamisil is for your toenails, but will help you with this as well.  It treats fungus. 9)  Schedule lab visit 6 weeks after starting Lamisil for:  LFTs (Dx high risk medication). 10)  Schedule appt with [facility] foot clinic for nail trimming and callus treatment. 11)  Schedule follow up with Scott in 3 months for diabetes.  Return sooner if your infection does not clear or get worse. 12)  [place], Please make sure medications can be filled by 11/07/2009 (Lamisil and Clotrimazole). Prescriptions: LAMISIL 250 MG TABS (TERBINAFINE HCL) Take 1 tablet by mouth once a day for 3 months  #30 x 2   Entered and Authorized by:   Tereso Newcomer PA-C   Signed by:   Tereso Newcomer PA-C on 11/06/2009   Method used:   Faxed to ...       [facility] (retail)       [address] [place], [place]  [address]       Ph: [phone]       Fax: [phone]   RxID:   [id number] CLOTRIMAZOLE 1 % CREA (CLOTRIMAZOLE) apply to irritated skin two times a day for one week  #30 grams x 1   Entered and Authorized by:   Tereso Newcomer PA-C   Signed by:   Tereso Newcomer PA-C on 11/06/2009   Method used:   Faxed to ...       [facility] (retail)       [address] [place], [place]  [address]       Ph: [phone]       Fax: [phone]   RxID:   [id number]     Last LDL:                                                 83 (08/06/2009 11:09:00 PM)        Diabetic Foot Exam    10-g (5.07) Semmes-Weinstein Monofilament Test           Right Foot          Left Foot Visual Inspection               Test Control      normal         normal Site 1         normal         normal Site 2          normal         abnormal Site 3         normal         normal Site 4         normal         normal Site 5         normal         normal Site 6         normal         normal Site 7         normal         normal Site 8         normal  normal Site 9         normal         normal Site 10         normal         normal  Impression      normal         normal    Pneumovax Vaccine    Vaccine Type: Pneumovax    Site: right deltoid    Mfr: Merck    Dose: 0.5 ml    Route: IM    Given by: CMA Student Kenyatta Keffries    Exp. Date: 03/09/2011    Lot #: [id number]    VIS given: 01/29/09 version given November 06, 2009.  Laboratory Results   Urine Tests    Routine Urinalysis   Glucose: negative   (Normal Range: Negative) Bilirubin: negative   (Normal Range: Negative) Ketone: negative   (Normal Range: Negative) Spec. Gravity: 1.025   (Normal Range: 1.003-1.035) Blood: negative   (Normal Range: Negative) pH: 5.5   (Normal Range: 5.0-8.0) Protein: negative   (Normal Range: Negative) Urobilinogen: 0.2   (Normal Range: 0-1) Nitrite: negative   (Normal Range: Negative) Leukocyte Esterace: negative   (Normal Range: Negative)     Blood Tests     CBG Random:: 122mg /dL

## 2010-03-28 NOTE — Letter (Signed)
Summary: *Referral Letter  [facility]  [address] [place], [place] [address]   Phone: [phone]  Fax: [phone]    06/07/2009  Thank you in advance for agreeing to see my patient:  Cristian Welch [address] Cristian Welch [place], [place]  [address]  Phone: [phone]  Reason for Referral: 50 yo prior smoker with symptoms of worsening dyspnea with exertion over the last 6 months.  He has had a normal echo, chest xray and PFTs.  Please consider exercise treadmill testing to r/o the remote possibility of cardiac ischemia.  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  No significant response to bronchodilators")   Prior History of Blood Transfusions:   Pertinent Labs:    Thank you again for agreeing to see our patient; please contact us if you have any further questions or need additional information.  Sincerely,  Tereso Newcomer PA-C

## 2010-05-14 ENCOUNTER — Encounter: Payer: Self-pay | Admitting: Nurse Practitioner

## 2010-05-14 ENCOUNTER — Encounter (INDEPENDENT_AMBULATORY_CARE_PROVIDER_SITE_OTHER): Payer: Self-pay | Admitting: Nurse Practitioner

## 2010-05-14 LAB — CONVERTED CEMR LAB
Blood Glucose, Fingerstick: 167
Hgb A1c MFr Bld: 6.2 %

## 2010-05-23 NOTE — Assessment & Plan Note (Signed)
Summary: Diabetes   Vital Signs:  Patient profile:   50 year old male Weight:      245.4 pounds BMI:     36.37 Temp:     98.0 degrees F oral Pulse rate:   96 / minute Pulse rhythm:   regular Resp:     20 per minute BP sitting:   118 / 78  (left arm) Cuff size:   regular  Vitals Entered By: Levon Hedger (May 14, 2010 12:14 PM)  Nutrition Counseling: Patient's BMI is greater than 25 and therefore counseled on weight management options. CC: f/u on DM Is Patient Diabetic? Yes Pain Assessment Patient in pain? no      CBG Result 167 CBG Device ID M non fasting  Does patient need assistance? Functional Status Self care Ambulation Normal   Primary Care Provider:  Tereso Newcomer PA-C  CC:  f/u on DM.  History of Present Illness:  Pt into the office for follow up on diabetes  Obesity - Down 3 pounds since the last visit.  Pt is not exercising meaningfully but he is very physical with his job.  Diabetes Management History:      The patient is a 50 years old male who comes in for evaluation of DM Type 2.  He is (or has been) enrolled in the "Diabetic Education Program".  He states lack of understanding of dietary principles and is not following his diet appropriately.  No sensory loss is reported.  Self foot exams are not being performed.  He is checking home blood sugars.        Hypoglycemic symptoms are not occurring.  No hyperglycemic symptoms are reported.        No changes have been made to his treatment plan since last visit.    Habits & Providers  Alcohol-Tobacco-Diet     Alcohol drinks/day: 0     Tobacco Status: quit     Pack years: 10 pack years  Exercise-Depression-Behavior     Have you felt down or hopeless? no     Have you felt little pleasure in things? no     Depression Counseling: not indicated; screening negative for depression  Allergies: No Known Drug Allergies  Review of Systems General:  Denies loss of appetite. CV:  Denies chest pain or  discomfort. Resp:  Denies cough. GI:  Denies abdominal pain, nausea, and vomiting. MS:  Denies joint pain.  Physical Exam  General:  alert.   Head:  normocephalic.   Lungs:  normal breath sounds.   Heart:  normal rate and regular rhythm.   Abdomen:  normal bowel sounds.   Msk:  up to the exam table Neurologic:  alert & oriented X3.   Skin:  color normal.   Psych:  Oriented X3.    Diabetes Management Exam:    Foot Exam (with socks and/or shoes not present):       Sensory-Monofilament:          Left foot: normal          Right foot: normal   Impression & Recommendations:  Problem # 1:  DIABETES MELLITUS, TYPE II (ICD-250.00) stable  continue current meds His updated medication list for this problem includes:    Metformin Hcl 500 Mg Xr24h-tab (Metformin hcl) .Marland Kitchen... Take 1 tablet by mouth once a day for diabetes  Orders: Capillary Blood Glucose/CBG (82948) Hemoglobin A1C (83036)  Problem # 2:  ELEVATED BP READING WITHOUT DX HYPERTENSION (ICD-796.2) BP is doing much better.  Will continue to monitor tdap given today  Complete Medication List: 1)  Proventil Hfa 108 (90 Base) Mcg/act Aers (Albuterol sulfate) .Marland Kitchen.. 1-2 puffs every 4-6 hours as needed 2)  Proctosol Hc 2.5 % Crea (Hydrocortisone) .... Apply two times a day after bowel movement as needed 3)  Metformin Hcl 500 Mg Xr24h-tab (Metformin hcl) .... Take 1 tablet by mouth once a day for diabetes 4)  Glucose Meter of Choice  .... Check sugar once daily 5)  Glucose Meter Strips  .... Check sugar once daily 6)  Lancets  .... Check sugar once daily  Other Orders: Tdap => 92yrs IM (09811) Admin 1st Vaccine (91478)  Patient Instructions: 1)  You have ben given a tetanus vaccine today.  It is good for 10 years. 2)  Diabetes - doing much better.  Keep taking your medication 3)  You should get a blood sugar machine from the pharmacy when you pick up your medication. 4)  Follow up in 6 months for  diabetes. Prescriptions: METFORMIN HCL 500 MG XR24H-TAB (METFORMIN HCL) Take 1 tablet by mouth once a day for diabetes  #30 x 11   Entered and Authorized by:   Lehman Prom FNP   Signed by:   Lehman Prom FNP on 05/14/2010   Method used:   Faxed to ...       [facility] (retail)       [address] [place], [place]  [address]       Ph: [phone]       Fax: [phone]   RxID:   [id number]    Orders Added: 1)  Capillary Blood Glucose/CBG [82948] 2)  Hemoglobin A1C [83036] 3)  Tdap => 65yrs IM [90715] 4)  Admin 1st Vaccine [90471] 5)  Est. Patient Level III [34742]   Immunizations Administered:  Tetanus Vaccine:    Vaccine Type: Tdap    Site: right deltoid    Mfr: Sanofi Pasteur    Dose: 0.5 ml    Route: IM    Given by: Levon Hedger    Exp. Date: 08/01/2012    Lot #: [id number]    VIS given: 01/12/08 version given May 14, 2010.    ndc  56433-295-18  Immunizations Administered:  Tetanus Vaccine:    Vaccine Type: Tdap    Site: right deltoid    Mfr: Sanofi Pasteur    Dose: 0.5 ml    Route: IM    Given by: Levon Hedger    Exp. Date: 08/01/2012    Lot #: [id number]    VIS given: 01/12/08 version given May 14, 2010.  Laboratory Results   Blood Tests   Date/Time Received: May 14, 2010 12:30 PM   HGBA1C: 6.2%   (Normal Range: Non-Diabetic - 3-6%   Control Diabetic - 6-8%) CBG Random:: 167mg /dL       Diabetic Foot Exam    10-g (5.07) Semmes-Weinstein Monofilament Test Performed by: Hale Drone CMA          Right Foot          Left Foot Visual Inspection     normal         normal Test Control      normal         normal Site 1         normal         normal Site 2         normal  normal Site 3         normal         normal Site 4         normal         normal Site 5         normal         normal Site 6         normal         normal Site 7         normal         normal Site 8          normal         normal Site 9         normal         normal Site 10         normal         normal  Impression      normal         normal

## 2010-11-21 LAB — POCT I-STAT, CHEM 8
BUN: 12
Calcium, Ion: 1.17
Chloride: 106
Glucose, Bld: 125 — ABNORMAL HIGH

## 2010-11-21 LAB — DIFFERENTIAL
Lymphocytes Relative: 31
Monocytes Absolute: 0.6
Monocytes Relative: 9
Neutro Abs: 3.8

## 2010-11-21 LAB — CBC
Hemoglobin: 15.6
RBC: 4.95

## 2010-11-21 LAB — ROCKY MTN SPOTTED FVR AB, IGG-BLOOD: RMSF IgG: <1:64 {titer}

## 2010-11-28 ENCOUNTER — Emergency Department (HOSPITAL_COMMUNITY)
Admission: EM | Admit: 2010-11-28 | Discharge: 2010-11-28 | Disposition: A | Discharge status: Home / Self Care | Payer: Self-pay | Attending: Emergency Medicine | Admitting: Emergency Medicine

## 2010-11-28 ENCOUNTER — Emergency Department (HOSPITAL_COMMUNITY): Payer: Self-pay

## 2010-11-28 DIAGNOSIS — M79609 Pain in unspecified limb: Secondary | ICD-10-CM | POA: Insufficient documentation

## 2010-11-28 DIAGNOSIS — E119 Type 2 diabetes mellitus without complications: Secondary | ICD-10-CM | POA: Insufficient documentation

## 2010-11-28 DIAGNOSIS — X58XXXA Exposure to other specified factors, initial encounter: Secondary | ICD-10-CM | POA: Insufficient documentation

## 2011-02-14 ENCOUNTER — Emergency Department (HOSPITAL_COMMUNITY)
Admission: EM | Admit: 2011-02-14 | Discharge: 2011-02-14 | Disposition: A | Discharge status: Home / Self Care | Payer: Self-pay | Attending: Emergency Medicine | Admitting: Emergency Medicine

## 2011-02-14 ENCOUNTER — Encounter: Payer: Self-pay | Admitting: *Deleted

## 2011-02-14 DIAGNOSIS — R51 Headache: Secondary | ICD-10-CM | POA: Insufficient documentation

## 2011-02-14 DIAGNOSIS — E119 Type 2 diabetes mellitus without complications: Secondary | ICD-10-CM | POA: Insufficient documentation

## 2011-02-14 DIAGNOSIS — J329 Chronic sinusitis, unspecified: Secondary | ICD-10-CM | POA: Insufficient documentation

## 2011-02-14 LAB — GLUCOSE, CAPILLARY: Glucose-Capillary: 139 mg/dL — ABNORMAL HIGH (ref 70–99)

## 2011-02-14 MED ORDER — AZITHROMYCIN 250 MG PO TABS: 250.0000 mg | ORAL_TABLET | Freq: Every day | ORAL | Status: AC

## 2011-02-14 MED ORDER — OXYCODONE-ACETAMINOPHEN 5-325 MG PO TABS
2.0000 | ORAL_TABLET | Freq: Once | ORAL | Status: AC
  Administered 2011-02-14: 2 via ORAL
  Filled 2011-02-14 (×2): qty 1

## 2011-02-14 NOTE — ED Notes (Signed)
Pt has tenderness over frontal,ethmoid and sphenoid sinis at palpation.he also states when he bends over to tie his shoes that he becomes really dizzy and it increases his headache pain.....denies blurred vision. States he is diabetic and he has not checked his blood sugar today.

## 2011-02-14 NOTE — ED Notes (Signed)
Warm compress made for pt and placed across his eyes and o his face where he states pain is worse. CBG 139.

## 2011-02-14 NOTE — ED Notes (Signed)
Pt states "I've had this h/a for 3 days, it feels like a sinus infection, it won't go away"

## 2011-02-14 NOTE — ED Provider Notes (Signed)
History     CSN: [id number]  Arrival date & time 02/14/11  7829   First MD Initiated Contact with Patient 02/14/11 0902      Chief Complaint  Patient presents with  . Headache    (Consider location/radiation/quality/duration/timing/severity/associated sxs/prior treatment) Patient is a 50 y.o. male presenting with headaches.  Headache   Patient complaining of headache for three days. Patient has had nasal congestion and sneezing prior to headache. No fever.   He has had similar symptoms with sinusitis in past.  No visual changes or eye pain.  Patient diagnosed with glaucoma this week by Dr. Mitzi Davenport and started drops.  Patient did not have any pain associated.  PMD is [facility].  Past Medical History  Diagnosis Date  . Diabetes mellitus   . Glaucoma     Past Surgical History  Procedure Date  . Foot surgery     left  . Hernia repair     inguinal    No family history on file.  History  Substance Use Topics  . Smoking status: Former Games developer  . Smokeless tobacco: Not on file  . Alcohol Use: Yes     states "quit this w/e"      Review of Systems  Neurological: Positive for headaches.  All other systems reviewed and are negative.    Allergies  Review of patient's allergies indicates no known allergies.  Home Medications   Current Outpatient Rx  Name Route Sig Dispense Refill  . ASPIRIN-ACETAMINOPHEN-CAFFEINE 500-325-65 MG PO PACK Oral Take 1 packet by mouth daily.      Marland Kitchen METFORMIN HCL 500 MG PO TABS Oral Take 500 mg by mouth every morning.      . MULTI-VITAMIN/MINERALS PO TABS Oral Take 1 tablet by mouth daily.      . TRAVOPROST 0.004 % OP SOLN Both Eyes Place 1 drop into both eyes at bedtime.        BP 132/87  Pulse 81  Temp(Src) 97.8 F (36.6 C) (Oral)  Resp 20  Wt 237 lb (107.502 kg)  SpO2 98%  Physical Exam  Nursing note and vitals reviewed. Constitutional: He appears well-developed and well-nourished.  HENT:  Head: Normocephalic and  atraumatic.  Right Ear: External ear normal.  Left Ear: External ear normal.  Mouth/Throat: Oropharynx is clear and moist.       Frontal sinus and maxillary sinus tenderness with palpation  Eyes: Conjunctivae and EOM are normal. Pupils are equal, round, and reactive to light.       No tenderness over globes  Neck: Normal range of motion. Neck supple.  Cardiovascular: Normal rate and regular rhythm.   Pulmonary/Chest: Effort normal and breath sounds normal.  Abdominal: Soft. Bowel sounds are normal.  Musculoskeletal: Normal range of motion.  Neurological: He is alert. He has normal strength and normal reflexes. No cranial nerve deficit or sensory deficit. He displays a negative Romberg sign. GCS eye subscore is 4. GCS verbal subscore is 5. GCS motor subscore is 6.  Skin: Skin is warm and dry.  Psychiatric: He has a normal mood and affect. His behavior is normal. Thought content normal.    ED Course  Procedures (including critical care time)  Labs Reviewed - No data to display No results found.   No diagnosis found.    MDM         Hilario Quarry, MD 02/14/11 [phone]

## 2011-05-16 ENCOUNTER — Emergency Department (HOSPITAL_COMMUNITY)
Admission: EM | Admit: 2011-05-16 | Discharge: 2011-05-17 | Disposition: A | Discharge status: Home / Self Care | Payer: Self-pay | Attending: Emergency Medicine | Admitting: Emergency Medicine

## 2011-05-16 ENCOUNTER — Encounter (HOSPITAL_COMMUNITY): Payer: Self-pay | Admitting: Family Medicine

## 2011-05-16 DIAGNOSIS — H1130 Conjunctival hemorrhage, unspecified eye: Secondary | ICD-10-CM | POA: Insufficient documentation

## 2011-05-16 DIAGNOSIS — Z8669 Personal history of other diseases of the nervous system and sense organs: Secondary | ICD-10-CM

## 2011-05-16 DIAGNOSIS — Z79899 Other long term (current) drug therapy: Secondary | ICD-10-CM | POA: Insufficient documentation

## 2011-05-16 DIAGNOSIS — H409 Unspecified glaucoma: Secondary | ICD-10-CM | POA: Insufficient documentation

## 2011-05-16 DIAGNOSIS — G589 Mononeuropathy, unspecified: Secondary | ICD-10-CM | POA: Insufficient documentation

## 2011-05-16 DIAGNOSIS — Z87891 Personal history of nicotine dependence: Secondary | ICD-10-CM | POA: Insufficient documentation

## 2011-05-16 DIAGNOSIS — E119 Type 2 diabetes mellitus without complications: Secondary | ICD-10-CM | POA: Insufficient documentation

## 2011-05-16 DIAGNOSIS — Z7982 Long term (current) use of aspirin: Secondary | ICD-10-CM | POA: Insufficient documentation

## 2011-05-16 MED ORDER — TETRACAINE HCL 0.5 % OP SOLN
2.0000 [drp] | Freq: Once | OPHTHALMIC | Status: AC
  Administered 2011-05-16: 2 [drp] via OPHTHALMIC

## 2011-05-16 MED ORDER — DORZOLAMIDE HCL-TIMOLOL MAL 2-0.5 % OP SOLN: 1.0000 [drp] | Freq: Two times a day (BID) | OPHTHALMIC | Status: AC

## 2011-05-16 MED ORDER — DIPHENHYDRAMINE HCL 25 MG PO CAPS
25.0000 mg | ORAL_CAPSULE | Freq: Once | ORAL | Status: AC
  Administered 2011-05-17: 25 mg via ORAL
  Filled 2011-05-16: qty 1

## 2011-05-16 NOTE — ED Provider Notes (Signed)
History     CSN: [id number]  Arrival date & time 05/16/11  2049   First MD Initiated Contact with Patient 05/16/11 2201      Chief Complaint  Patient presents with  . Eye Pain    (Consider location/radiation/quality/duration/timing/severity/associated sxs/prior treatment) HPI Comments: Patient with history of glaucoma comes in today with right eye scleral redness for the past 2 days.  He reports that he was diagnosed with Glaucoma months ago and has been on Travatan drops daily for this.  He has been using the drops as directed.  His Opthalmologist is Dr. Mitzi Davenport.  He denies any vision changes or pain.  He reports that the eye does itch.  He also reports frequent coughing and sneezing over the past couple of days.  Patient is a 51 y.o. male presenting with eye problem. The history is provided by the patient.  Eye Problem  This is a new problem. Episode onset: 2 days ago. There is pain in the right eye. There was no injury mechanism. There is no history of trauma to the eye. There is no known exposure to pink eye. He does not wear contacts. Associated symptoms include discharge, eye redness and itching. Pertinent negatives include no numbness, no blurred vision, no decreased vision, no double vision, no foreign body sensation, no photophobia, no nausea and no vomiting. He has tried nothing for the symptoms.    Past Medical History  Diagnosis Date  . Diabetes mellitus   . Glaucoma   . Neuropathy     Past Surgical History  Procedure Date  . Foot surgery     left  . Hernia repair     inguinal    No family history on file.  History  Substance Use Topics  . Smoking status: Former Games developer  . Smokeless tobacco: Not on file  . Alcohol Use: No     states "quit this w/e"      Review of Systems  Constitutional: Negative for fever and chills.  Eyes: Positive for discharge, redness and itching. Negative for blurred vision, double vision, photophobia, pain and visual  disturbance.  Gastrointestinal: Negative for nausea and vomiting.  Skin: Positive for itching. Negative for color change and rash.  Neurological: Negative for dizziness, syncope, numbness and headaches.    Allergies  Review of patient's allergies indicates no known allergies.  Home Medications   Current Outpatient Rx  Name Route Sig Dispense Refill  . ASPIRIN-ACETAMINOPHEN-CAFFEINE 500-325-65 MG PO PACK Oral Take 1 packet by mouth daily.      Marland Kitchen METFORMIN HCL 500 MG PO TABS Oral Take 500 mg by mouth every morning.      . MULTI-VITAMIN/MINERALS PO TABS Oral Take 1 tablet by mouth daily.      . TRAVOPROST 0.004 % OP SOLN Both Eyes Place 1 drop into both eyes at bedtime.        BP 142/97  Pulse 87  Temp(Src) 98.5 F (36.9 C) (Oral)  Resp 18  SpO2 97%  Physical Exam  Nursing note and vitals reviewed. Constitutional: He appears well-developed and well-nourished. No distress.  HENT:  Head: Normocephalic and atraumatic.  Eyes: EOM and lids are normal. Pupils are equal, round, and reactive to light. No foreign bodies found. Right conjunctiva has a hemorrhage. Left conjunctiva has no hemorrhage.       Visual acuity 20/15 bilaterally IOP 33 right eye IOP 28 left eye  Fluorosein stain did show any corneal abrasion or other abnormalities of right eye   Neck:  Normal range of motion. Neck supple.  Cardiovascular: Normal rate, regular rhythm and normal heart sounds.   Pulmonary/Chest: Effort normal and breath sounds normal. No respiratory distress. He has no wheezes.  Neurological: He is alert.  Skin: Skin is warm and dry. No rash noted. He is not diaphoretic. No erythema.  Psychiatric: He has a normal mood and affect.    ED Course  Procedures (including critical care time)  Labs Reviewed - No data to display No results found.   1. Subconjunctival hemorrhage, non-traumatic   2. History of glaucoma     11:38 PM Discussed with Dr. Allyne Gee Opthalmology.  He recommends adding  Cosopt eye drops bid to patients current drops and having him follow up with Dr. Mitzi Davenport in the office next week.  MDM  Patient with history of glaucoma comes in today with subconjunctival hemorrhage of right eye.  IOP elevated bilaterally, but discussed with Ophthalmologist on call and per their recommendation patient started on Cosopt eye drops.  Visual acuity 20/15 bilaterally.  Fluoresein stain performed and did not visualize any abrasions.          Pascal Lux [place], PA-C 05/17/11 [phone]

## 2011-05-16 NOTE — ED Notes (Signed)
Patient states that he started having right eye pain Wednesday night. States eye has gotten redder since. States he has glaucoma and has not changed any eye gtts.

## 2011-05-16 NOTE — Discharge Instructions (Signed)
Subconjunctival Hemorrhage A subconjunctival hemorrhage is a bright red patch covering a portion of the white of the eye. The white part of the eye is called the sclera, and it is covered by a thin membrane called the conjunctiva. This membrane is clear, except for tiny blood vessels that you can see with the naked eye. When your eye is irritated or inflamed and becomes red, it is because the vessels in the conjunctiva are swollen. Sometimes, a blood vessel in the conjunctiva can break and bleed. When this occurs, the blood builds up between the conjunctiva and the sclera, and spreads out to create a red area. The red spot may be very small at first. It may then spread to cover a larger part of the surface of the eye, or even all of the visible white part of the eye. In almost all cases, the blood will go away and the eye will become white again. Before completely dissolving, however, the red area may spread. It may also become brownish-yellow in color, before going away. If a lot of blood collects under the conjunctiva, it may look like a bulge on the surface of the eye. This looks scary, but it will also eventually flatten out and go away. Subconjunctival hemorrhages do not cause pain, but if swollen, may cause a feeling of irritation. There is no effect on vision.  CAUSES   The most common cause is mild trauma (rubbing the eye, irritation).   Subconjunctival hemorrhages can happen because of coughing or straining (lifting heavy objects), vomiting, or sneezing.   In some cases, your doctor may want to check your blood pressure. High blood pressure can also cause a sunconjunctival hemorrhage.   Severe trauma or blunt injuries.   Diseases that affect blood clotting (hemophilia, leukemia).   Abnormalities of blood vessels behind the eye (carotid cavernous sinus fistula).   Tumors behind the eye.   Certain drugs (aspirin, coumadin, heparin).   Recent eye surgery.  HOME CARE INSTRUCTIONS   Do  not worry about the appearance of your eye. You may continue your usual activities.   Often, follow-up is not necessary.  SEEK MEDICAL CARE IF:   Your eye becomes painful.   The bleeding does not disappear within 3 weeks.   Bleeding occurs elsewhere, for example, under the skin, in the mouth, or in the other eye.   You have recurring subconjunctival hemorrhages.  SEEK IMMEDIATE MEDICAL CARE IF:   Your vision changes or you have difficulty seeing.   You develop severe headache, persistent vomiting, confusion, or abnormal drowsiness (lethargy).   Your eye seems to bulge or protrude from the eye socket.   You notice the sudden appearance of bruises, or have spontaneous bleeding elsewhere on your body.  Document Released: 02/10/2005 Document Revised: 01/30/2011 Document Reviewed: 01/08/2009 [employer] Patient Information 2012 [employer]. 

## 2011-05-16 NOTE — ED Provider Notes (Signed)
She has a history of glaucoma that is being treated. He does not know what his pressures are. He states he  has been having coughing and sneezing the past couple days. He states today he is noted the white of his right eye is red. He states he has itching without pain. He denies loss of vision he has never had this before.  On physical exam patient has a subconjunctival hematoma that starts on the lateral sclera of the right body and extends inferior to the cornea. He has small pterygiums bilaterally on the cornea at about 6:00 and 9:00.  Medical screening examination/treatment/procedure(s) were conducted as a shared visit with non-physician practitioner(s) and myself.  I personally evaluated the patient during the encounter Devoria Albe, MD, Franz Dell, MD 05/16/11 [phone]

## 2011-05-18 NOTE — ED Provider Notes (Signed)
See prior note   Dovey Fatzinger L Galena Logie, MD 05/18/11 0001 

## 2012-05-13 ENCOUNTER — Ambulatory Visit (INDEPENDENT_AMBULATORY_CARE_PROVIDER_SITE_OTHER): Payer: Medicaid Other | Admitting: General Surgery

## 2012-05-13 ENCOUNTER — Encounter (INDEPENDENT_AMBULATORY_CARE_PROVIDER_SITE_OTHER): Payer: Self-pay | Admitting: General Surgery

## 2012-05-13 VITALS — BP 140/70 | HR 108 | Temp 97.1°F | Resp 20 | Ht 75.0 in | Wt 250.0 lb

## 2012-05-13 DIAGNOSIS — K649 Unspecified hemorrhoids: Secondary | ICD-10-CM

## 2012-05-13 MED ORDER — HYDROCORTISONE 2.5 % RE CREA: TOPICAL_CREAM | Freq: Two times a day (BID) | RECTAL | Status: DC

## 2012-05-13 NOTE — Progress Notes (Addendum)
Patient ID: Cristian Welch, male   DOB: [birth year], 52 y.o.   MRN: [id number]  Chief complaint:  Hemorrhoids.     HPI Cristian Welch is a 52 y.o. male.   HPI Pt is a 52 yo M with anal bleeding, itching, and burning on and off for 10 years.  It has been much worse over the last year.  This has progressively gotten worse.  He feels something swollen externally.  He has been using over the counter generic hemorrhoid suppositories.  He usually has one "normal" bowel movement per day that he describes as not hard.  He denies spending a lot of time on the toilet.  He states that he eats a lot of vegetables and does not strain.  He has blood when he wipes, but does not have dripping into the toilet, or on his pants.  He has been having more blood recently.  He has had some hemorrhoids before, but he states that these always got better quickly.     Past Medical History  Diagnosis Date  . Diabetes mellitus   . Glaucoma(365)   . Neuropathy   . Hemorrhoids     Past Surgical History  Procedure Laterality Date  . Foot surgery      left  . Hernia repair      inguinal  . Hernia repair      RIH    History reviewed. No pertinent family history.  Social History History  Substance Use Topics  . Smoking status: Former Games developer  . Smokeless tobacco: Not on file  . Alcohol Use: No     Comment: states "quit this w/e"    No Known Allergies  Current Outpatient Prescriptions  Medication Sig Dispense Refill  . Aspirin-Acetaminophen-Caffeine (GOODYS EXTRA STRENGTH) 500-325-65 MG PACK Take 1 packet by mouth daily.        . metFORMIN (GLUCOPHAGE) 500 MG tablet Take 500 mg by mouth every morning.        . travoprost, benzalkonium, (TRAVATAN) 0.004 % ophthalmic solution Place 1 drop into both eyes at bedtime.        . dorzolamide-timolol (COSOPT) 22.3-6.8 MG/ML ophthalmic solution Place 1 drop into both eyes 2 (two) times daily.  10 mL  12  . hydrocortisone (ANUSOL-HC) 2.5 % rectal cream Place rectally 2  (two) times daily.  30 g  0  . Multiple Vitamins-Minerals (MULTIVITAMIN WITH MINERALS) tablet Take 1 tablet by mouth daily.         No current facility-administered medications for this visit.    Review of Systems Review of Systems  Gastrointestinal: Positive for anal bleeding.    Blood pressure 140/70, pulse 108, temperature 97.1 F (36.2 C), temperature source Temporal, resp. rate 20, height 6\' 3"  (1.905 m), weight 250 lb (113.399 kg).  Physical Exam Physical Exam  Constitutional: He is oriented to person, place, and time. He appears well-developed and well-nourished. No distress.  HENT:  Head: Normocephalic.  Eyes: Conjunctivae are normal. Pupils are equal, round, and reactive to light. Right eye exhibits no discharge. Left eye exhibits no discharge.  Neck: Normal range of motion. Neck supple.  Cardiovascular: Normal rate.   Pulmonary/Chest: Effort normal. No respiratory distress.  Abdominal: Soft.  Genitourinary: Rectal exam shows external hemorrhoid, internal hemorrhoid and tenderness.     Pedunculated external hemorrhoid posteriorly  Excoriations around anus. Large internal hemorrhoid posteriorly  Musculoskeletal: Normal range of motion. He exhibits no edema.  Neurological: He is alert and oriented to person, place, and time.  Skin:  Skin is warm and dry.  Psychiatric: He has a normal mood and affect. His behavior is normal. Judgment and thought content normal.  slightly anxious    Assessment/Plan    Hemorrhoids Pt with internal and external hemorrhoid. Injected internal hemorrhoid.   Pt also with excoriations around anus. Pt advised to do desitin on perianal region for 2 weeks as symptoms of burning and itching seem to be secondary to this.   Gave script for anusol to start in 2 weeks for external hemorrhoid.  Advised to do sitz baths and take stool softeners as well.        Denese Mentink 05/13/2012, 4:26 PM

## 2012-05-13 NOTE — Patient Instructions (Addendum)
Over the counter stool softener twice daily.  Desitin (over the counter diaper rash cream)  to anal area twice daily for about one -two weeks.  Warm water soaks after each bowel movement.  Apply prescription cream to anal area TO START in 1-2 weeks.  Do not do the desitin and the hemorrhoid cream at the same time.

## 2012-05-24 NOTE — Assessment & Plan Note (Addendum)
Pt with internal and external hemorrhoid. Injected internal hemorrhoid.   Pt also with excoriations around anus. Pt advised to do desitin on perianal region for 2 weeks as symptoms of burning and itching seem to be secondary to this.   Gave script for anusol to start in 2 weeks for external hemorrhoid.  Advised to do sitz baths and take stool softeners as well.

## 2012-06-21 ENCOUNTER — Ambulatory Visit (INDEPENDENT_AMBULATORY_CARE_PROVIDER_SITE_OTHER): Payer: Medicaid Other | Admitting: General Surgery

## 2012-06-21 ENCOUNTER — Encounter (INDEPENDENT_AMBULATORY_CARE_PROVIDER_SITE_OTHER): Payer: Self-pay | Admitting: General Surgery

## 2012-06-21 VITALS — BP 130/88 | HR 100 | Temp 98.1°F | Resp 18 | Ht 72.0 in | Wt 243.6 lb

## 2012-06-21 DIAGNOSIS — N478 Other disorders of prepuce: Secondary | ICD-10-CM

## 2012-06-21 DIAGNOSIS — K649 Unspecified hemorrhoids: Secondary | ICD-10-CM

## 2012-06-21 DIAGNOSIS — N4889 Other specified disorders of penis: Secondary | ICD-10-CM

## 2012-06-21 NOTE — Progress Notes (Signed)
HISTORY: Pt doing much much better since i saw him before.  He has been doing desitin cream on perianal skin and taking stool softeners.  He denies pain.  He had one episode of bleeding this weekend, but "drank too much."  Denies perianal pain.  Itching almost completely gone.  He is complaining about rash on penis and wanting a circumcision.     PERTINENT REVIEW OF SYSTEMS: Otherwise negative.    Filed Vitals:   06/21/12 1144  BP: 130/88  Pulse: 100  Temp: 98.1 F (36.7 C)  Resp: 18    EXAM: Head: Normocephalic and atraumatic.  Eyes:  Conjunctivae are normal. Pupils are equal, round, and reactive to light. No scleral icterus.   Resp: No respiratory distress, normal effort. Abd:  Abdomen is soft, non distended and non tender. Neurological: Alert and oriented to person, place, and time. Coordination normal.  GU:  Perianal irritation nearly resolved.  Small external hemorrhoid on left.  Internal exam not repeated.  Glans of penis very red with patchy rash.   Skin: Skin is warm and dry. No rash noted. No diaphoretic. No erythema. No pallor.  Psychiatric: Normal mood and affect. Normal behavior. Judgment and thought content normal.      ASSESSMENT AND PLAN:   Hemorrhoids Dramatic improvement in perianal irritation.   Still with small external hemorrhoid.  Recommend continuing prescription hemorrhoid cream.  Will refer to urology for consideration of circumcision.    Follow up with me as needed.        Maudry Diego, MD Surgical Oncology, General & Endocrine Surgery [facility]  Burtis Junes, MD Bruna Potter, Viviana Simpler, *

## 2012-06-21 NOTE — Assessment & Plan Note (Signed)
Dramatic improvement in perianal irritation.   Still with small external hemorrhoid.  Recommend continuing prescription hemorrhoid cream.  Will refer to urology for consideration of circumcision.    Follow up with me as needed.

## 2012-06-21 NOTE — Patient Instructions (Signed)
Continue hemorrhoid cream.  Call for further symptoms.  Will make referral to urology.

## 2012-06-24 ENCOUNTER — Other Ambulatory Visit: Payer: Self-pay | Admitting: Urology

## 2012-07-02 ENCOUNTER — Encounter (HOSPITAL_BASED_OUTPATIENT_CLINIC_OR_DEPARTMENT_OTHER): Payer: Self-pay | Admitting: *Deleted

## 2012-07-05 ENCOUNTER — Encounter (HOSPITAL_BASED_OUTPATIENT_CLINIC_OR_DEPARTMENT_OTHER): Payer: Self-pay | Admitting: *Deleted

## 2012-07-05 NOTE — Progress Notes (Signed)
NPO AFTER MN. ARRIVES AT 0700. NEEDS ISTAT 8 AND EKG. WILL DO FLEET ENEMA AM OF DOS.

## 2012-07-06 ENCOUNTER — Other Ambulatory Visit: Payer: Self-pay | Admitting: Urology

## 2012-07-07 ENCOUNTER — Other Ambulatory Visit: Payer: Self-pay

## 2012-07-07 ENCOUNTER — Encounter (HOSPITAL_BASED_OUTPATIENT_CLINIC_OR_DEPARTMENT_OTHER): Payer: Self-pay | Admitting: Anesthesiology

## 2012-07-07 ENCOUNTER — Ambulatory Visit (HOSPITAL_BASED_OUTPATIENT_CLINIC_OR_DEPARTMENT_OTHER)
Admission: RE | Admit: 2012-07-07 | Discharge: 2012-07-07 | Disposition: A | Discharge status: Home / Self Care | Payer: Medicaid Other | Source: Ambulatory Visit | Attending: Urology | Admitting: Urology

## 2012-07-07 ENCOUNTER — Ambulatory Visit (HOSPITAL_BASED_OUTPATIENT_CLINIC_OR_DEPARTMENT_OTHER): Payer: Medicaid Other | Admitting: Anesthesiology

## 2012-07-07 ENCOUNTER — Encounter (HOSPITAL_BASED_OUTPATIENT_CLINIC_OR_DEPARTMENT_OTHER)
Admission: RE | Disposition: A | Discharge status: Home / Self Care | Payer: Self-pay | Source: Ambulatory Visit | Attending: Urology

## 2012-07-07 DIAGNOSIS — E119 Type 2 diabetes mellitus without complications: Secondary | ICD-10-CM | POA: Insufficient documentation

## 2012-07-07 DIAGNOSIS — N498 Inflammatory disorders of other specified male genital organs: Secondary | ICD-10-CM | POA: Insufficient documentation

## 2012-07-07 DIAGNOSIS — R972 Elevated prostate specific antigen [PSA]: Secondary | ICD-10-CM | POA: Insufficient documentation

## 2012-07-07 DIAGNOSIS — N476 Balanoposthitis: Secondary | ICD-10-CM | POA: Insufficient documentation

## 2012-07-07 DIAGNOSIS — K219 Gastro-esophageal reflux disease without esophagitis: Secondary | ICD-10-CM | POA: Insufficient documentation

## 2012-07-07 DIAGNOSIS — N492 Inflammatory disorders of scrotum: Secondary | ICD-10-CM

## 2012-07-07 DIAGNOSIS — Z79899 Other long term (current) drug therapy: Secondary | ICD-10-CM | POA: Insufficient documentation

## 2012-07-07 DIAGNOSIS — IMO0002 Reserved for concepts with insufficient information to code with codable children: Secondary | ICD-10-CM | POA: Insufficient documentation

## 2012-07-07 HISTORY — DX: Allergic rhinitis, unspecified: J30.9

## 2012-07-07 HISTORY — DX: Gastro-esophageal reflux disease without esophagitis: K21.9

## 2012-07-07 HISTORY — PX: IRRIGATION AND DEBRIDEMENT ABSCESS: SHX5252

## 2012-07-07 LAB — POCT I-STAT, CHEM 8
Calcium, Ion: 1.21 mmol/L (ref 1.12–1.23)
Hemoglobin: 15.6 g/dL (ref 13.0–17.0)
Sodium: 139 mEq/L (ref 135–145)
TCO2: 25 mmol/L (ref 0–100)

## 2012-07-07 SURGERY — IRRIGATION AND DEBRIDEMENT ABSCESS
Anesthesia: General | Site: Arm Upper | Laterality: Right | Wound class: Dirty or Infected

## 2012-07-07 MED ORDER — MEPERIDINE HCL 25 MG/ML IJ SOLN
6.2500 mg | INTRAMUSCULAR | Status: DC | PRN
  Filled 2012-07-07: qty 1

## 2012-07-07 MED ORDER — SULFAMETHOXAZOLE-TMP DS 800-160 MG PO TABS: 1.0000 | ORAL_TABLET | Freq: Two times a day (BID) | ORAL | Status: DC

## 2012-07-07 MED ORDER — PROMETHAZINE HCL 25 MG/ML IJ SOLN
6.2500 mg | INTRAMUSCULAR | Status: DC | PRN
  Filled 2012-07-07: qty 1

## 2012-07-07 MED ORDER — LACTATED RINGERS IV SOLN
INTRAVENOUS | Status: DC
  Filled 2012-07-07: qty 1000

## 2012-07-07 MED ORDER — GENTAMICIN SULFATE 40 MG/ML IJ SOLN
540.0000 mg | INTRAVENOUS | Status: DC | PRN
  Administered 2012-07-07: 450 mg via INTRAVENOUS

## 2012-07-07 MED ORDER — MIDAZOLAM HCL 5 MG/5ML IJ SOLN
INTRAMUSCULAR | Status: DC | PRN
  Administered 2012-07-07: 2 mg via INTRAVENOUS

## 2012-07-07 MED ORDER — GENTAMICIN SULFATE 40 MG/ML IJ SOLN
5.0000 mg/kg | INTRAVENOUS | Status: DC
  Filled 2012-07-07: qty 13.81

## 2012-07-07 MED ORDER — PROPOFOL 10 MG/ML IV EMUL
INTRAVENOUS | Status: DC | PRN
  Administered 2012-07-07: 75 ug/kg/min via INTRAVENOUS

## 2012-07-07 MED ORDER — SENNOSIDES-DOCUSATE SODIUM 8.6-50 MG PO TABS: 1.0000 | ORAL_TABLET | Freq: Two times a day (BID) | ORAL | Status: DC

## 2012-07-07 MED ORDER — LACTATED RINGERS IV SOLN
INTRAVENOUS | Status: DC
  Administered 2012-07-07: 08:00:00 via INTRAVENOUS
  Filled 2012-07-07: qty 1000

## 2012-07-07 MED ORDER — VANCOMYCIN HCL 10 G IV SOLR
1500.0000 mg | Freq: Once | INTRAVENOUS | Status: AC
  Administered 2012-07-07: 1500 mg via INTRAVENOUS
  Filled 2012-07-07: qty 1500

## 2012-07-07 MED ORDER — OXYCODONE-ACETAMINOPHEN 5-325 MG PO TABS: 1.0000 | ORAL_TABLET | ORAL | Status: DC | PRN

## 2012-07-07 MED ORDER — FENTANYL CITRATE 0.05 MG/ML IJ SOLN
INTRAMUSCULAR | Status: DC | PRN
  Administered 2012-07-07: 100 ug via INTRAVENOUS

## 2012-07-07 MED ORDER — FLEET ENEMA 7-19 GM/118ML RE ENEM
1.0000 | ENEMA | Freq: Once | RECTAL | Status: DC
  Filled 2012-07-07: qty 1

## 2012-07-07 MED ORDER — LACTATED RINGERS IV SOLN
INTRAVENOUS | Status: DC
  Administered 2012-07-07: 09:00:00 via INTRAVENOUS
  Filled 2012-07-07: qty 1000

## 2012-07-07 MED ORDER — BUPIVACAINE HCL 0.25 % IJ SOLN
INTRAMUSCULAR | Status: DC | PRN
  Administered 2012-07-07: 8 mL

## 2012-07-07 MED ORDER — FENTANYL CITRATE 0.05 MG/ML IJ SOLN
25.0000 ug | INTRAMUSCULAR | Status: DC | PRN
  Filled 2012-07-07: qty 1

## 2012-07-07 MED ORDER — ONDANSETRON HCL 4 MG/2ML IJ SOLN
INTRAMUSCULAR | Status: DC | PRN
  Administered 2012-07-07: 4 mg via INTRAVENOUS

## 2012-07-07 SURGICAL SUPPLY — 41 items
BANDAGE GAUZE ELAST BULKY 4 IN (GAUZE/BANDAGES/DRESSINGS) ×3 IMPLANT
BLADE SURG 11 STRL SS (BLADE) ×3 IMPLANT
BLADE SURG 15 STRL LF DISP TIS (BLADE) ×2 IMPLANT
BLADE SURG 15 STRL SS (BLADE) ×1
BLADE SURG ROTATE 9660 (MISCELLANEOUS) ×3 IMPLANT
BNDG COHESIVE 1X5 TAN STRL LF (GAUZE/BANDAGES/DRESSINGS) IMPLANT
CLOTH BEACON ORANGE TIMEOUT ST (SAFETY) ×3 IMPLANT
COVER MAYO STAND STRL (DRAPES) ×3 IMPLANT
COVER TABLE BACK 60X90 (DRAPES) ×3 IMPLANT
DRAPE PED LAPAROTOMY (DRAPES) ×3 IMPLANT
DRESSING TELFA 8X3 (GAUZE/BANDAGES/DRESSINGS) IMPLANT
ELECT REM PT RETURN 9FT ADLT (ELECTROSURGICAL) ×3
ELECTRODE REM PT RTRN 9FT ADLT (ELECTROSURGICAL) ×2 IMPLANT
GAUZE PACKING IODOFORM 1/2 (PACKING) ×6 IMPLANT
GAUZE VASELINE 1X8 (GAUZE/BANDAGES/DRESSINGS) IMPLANT
GLOVE BIO SURGEON STRL SZ7 (GLOVE) ×3 IMPLANT
GLOVE BIOGEL PI IND STRL 7.5 (GLOVE) IMPLANT
GLOVE BIOGEL PI INDICATOR 7.5 (GLOVE)
GLOVE ECLIPSE 7.0 STRL STRAW (GLOVE) ×3 IMPLANT
GLOVE ECLIPSE 7.5 STRL STRAW (GLOVE) IMPLANT
GLOVE INDICATOR 7.5 STRL GRN (GLOVE) ×3 IMPLANT
GOWN PREVENTION PLUS XLARGE (GOWN DISPOSABLE) ×6 IMPLANT
GOWN STRL REIN XL XLG (GOWN DISPOSABLE) IMPLANT
IV NS IRRIG 3000ML ARTHROMATIC (IV SOLUTION) IMPLANT
NEEDLE HYPO 22GX1.5 SAFETY (NEEDLE) ×6 IMPLANT
NS IRRIG 1000ML POUR BTL (IV SOLUTION) ×3 IMPLANT
NS IRRIG 500ML POUR BTL (IV SOLUTION) IMPLANT
PACK BASIN DAY SURGERY FS (CUSTOM PROCEDURE TRAY) ×3 IMPLANT
PENCIL BUTTON HOLSTER BLD 10FT (ELECTRODE) ×3 IMPLANT
SPONGE GAUZE 4X4 12PLY (GAUZE/BANDAGES/DRESSINGS) ×3 IMPLANT
SURGILUBE 2OZ TUBE FLIPTOP (MISCELLANEOUS) IMPLANT
SUT CHROMIC 3 0 PS 2 (SUTURE) IMPLANT
SUT SILK 2 0 (SUTURE)
SUT SILK 2-0 18XBRD TIE 12 (SUTURE) IMPLANT
SYR CONTROL 10ML LL (SYRINGE) ×6 IMPLANT
TOWEL OR 17X24 6PK STRL BLUE (TOWEL DISPOSABLE) ×6 IMPLANT
TOWEL OR 17X26 10 PK STRL BLUE (TOWEL DISPOSABLE) IMPLANT
TRAY DSU PREP LF (CUSTOM PROCEDURE TRAY) ×3 IMPLANT
UNDERPAD 30X30 INCONTINENT (UNDERPADS AND DIAPERS) ×3 IMPLANT
WATER STERILE IRR 3000ML UROMA (IV SOLUTION) IMPLANT
WATER STERILE IRR 500ML POUR (IV SOLUTION) IMPLANT

## 2012-07-07 NOTE — Progress Notes (Signed)
Pt instructed to call the office for any questions or concerns.  If he has difficulty changing dressings, he should call dr. Hilario Quarry office and let them know.  Maybe he can get an agency nurse to visit and change the dressings .

## 2012-07-07 NOTE — Anesthesia Postprocedure Evaluation (Signed)
  Anesthesia Post-op Note  Patient: Cristian Welch  Procedure(s) Performed: Procedure(s) (LRB): IRRIGATION AND DEBRIDEMENT ABSCESS (Right)  Patient Location: PACU  Anesthesia Type: MAC  Level of Consciousness: awake and alert   Airway and Oxygen Therapy: Patient Spontanous Breathing  Post-op Pain: mild  Post-op Assessment: Post-op Vital signs reviewed, Patient's Cardiovascular Status Stable, Respiratory Function Stable, Patent Airway and No signs of Nausea or vomiting  Last Vitals:  Filed Vitals:   07/07/12 1015  BP: 126/91  Pulse: 83  Temp:   Resp: 15    Post-op Vital Signs: stable   Complications: No apparent anesthesia complications

## 2012-07-07 NOTE — Anesthesia Preprocedure Evaluation (Addendum)
Anesthesia Evaluation  Patient identified by MRN, date of birth, ID band Patient awake    Reviewed: Allergy & Precautions, H&P , NPO status , Patient's Chart, lab work & pertinent test results  Airway Mallampati: II TM Distance: >3 FB Neck ROM: Full    Dental no notable dental hx. (+) Partial Upper   Pulmonary neg pulmonary ROS,  breath sounds clear to auscultation  Pulmonary exam normal       Cardiovascular negative cardio ROS  Rhythm:Regular Rate:Normal     Neuro/Psych negative neurological ROS  negative psych ROS   GI/Hepatic negative GI ROS, Neg liver ROS,   Endo/Other  negative endocrine ROSdiabetes, Poorly Controlled, Type 2, Oral Hypoglycemic Agents  Renal/GU negative Renal ROS  negative genitourinary   Musculoskeletal negative musculoskeletal ROS (+)   Abdominal   Peds negative pediatric ROS (+)  Hematology negative hematology ROS (+)   Anesthesia Other Findings   Reproductive/Obstetrics negative OB ROS                          Anesthesia Physical Anesthesia Plan  ASA: III  Anesthesia Plan: General and MAC   Post-op Pain Management:    Induction: Intravenous  Airway Management Planned: LMA and Simple Face Mask  Additional Equipment:   Intra-op Plan:   Post-operative Plan: Extubation in OR  Informed Consent: I have reviewed the patients History and Physical, chart, labs and discussed the procedure including the risks, benefits and alternatives for the proposed anesthesia with the patient or authorized representative who has indicated his/her understanding and acceptance.   Dental advisory given  Plan Discussed with: CRNA  Anesthesia Plan Comments:        Anesthesia Quick Evaluation

## 2012-07-07 NOTE — Op Note (Signed)
Urology Operative Report  Date of Procedure: 07/07/12  Surgeon: Natalia Leatherwood, MD Assistant: None  Preoperative Diagnosis: Scrotal abscesses. Right upper arm abscess Postoperative Diagnosis:  Same  Procedure(s): Incision and drainage of scrotal abscesses (x3- refer to description for details on size/location). Right upper extremity abscess incision and drainage.  Estimated blood loss: Minimal  Specimen: Wound cultures from the scrotum and the right extremity taken with a related and anaerobic cultures from both areas.  Drains: None  Complications: None  Findings: Scrotal abscesses and RUE abscess.   History of present illness: 52 year old male with balanitis and elevated PSA presented today for the planned procedure of transrectal prostate biopsy and circumcision. 3 days before his scheduled procedure he developed scrotal abscesses and a large abscess on his right upper trimming overlying the deltoid muscle. Due to concern over possible complications from the scrotal wound abscesses I elected to cancel prostate biopsy and circumcision. We discussed proceeding with monitored anesthesia care and local anesthetic to perform incision and drainage for his right extremity abscess and scrotal abscesses.   Procedure in detail: After informed consent was obtained, the patient was taken to the operating room. They were placed in the supine position. SCDs were turned on and in place. IV antibiotics were infused, and general anesthesia was induced. A timeout was performed in which the correct patient, surgical site, and procedure were identified and agreed upon by the team.  The patient was placed in a supine position, making sure to pad all pertinent neurovascular pressure points. Hair was removed from his genitals. The genitals and right upper arm were prepped and draped in the usual sterile fashion. I injected quarter percent plain Marcaine into the area surrounding the scrotal abscesses.  Upon further inspection it appears that he has a smaller abscess on the left anterior scrotum which was approximately 5 mm in size. There was another scrotal abscess on the left inferior scrotal that was approximately 1.5 x 1.5 cm in size. And the third scrotal abscess on the posterior midline which was 2.5 x 2 cm in size. After an injected around all these I used a 15 blade scalpel to make a cruciate incision on the posterior and lower scrotal abscesses and a single incision on the anterior superior abscess. There was return of purulent fluid. Aerobic and anaerobic wound cultures were taken. I then explored the abscesses bluntly with hemostats and then your gated copiously with sterile normal saline. After this was done I packed the posterior and inferior abscesses with half-inch iodoform packing. The superior anterior abscess was too small to pack. Gauze and scrotal support were placed over his scrotum.   We then changed gloves and had new draping for his right upper charity. Using a new scalpel and instruments I injected quarter percent Marcaine plain around his right upper charity abscess. This abscess was approximately 4 x 3 cm. I then made a cruciate incision with an 11 blade scalpel. There was return of purulent material. Wound cultures for aerobic and anaerobic were sent. I then explored the abscess cavity bluntly with hemostats. I then copiously irrigated the abscess pocket with sterile normal saline. The wound is then packed with half-inch iodoform gauze. The area of erythema was marked with a marking pen. Packing gauze was then covered loosely with a dry gauze and tape.   This completed the procedure.  He will be discharged home with packing materials and structures attack once daily. I will give him Percocet for pain and Bactrim double strength twice daily  for 14 days. He'll return to clinic with my nurse practitioner this coming Monday for wound check. He was instructed to present immediately to  the ER if the erythema surrounding his right upper chairman the or scrotum worsened or he developed fever.

## 2012-07-07 NOTE — Transfer of Care (Signed)
Immediate Anesthesia Transfer of Care Note  Patient: Cristian Welch  Procedure(s) Performed: Procedure(s) with comments: IRRIGATION AND DEBRIDEMENT ABSCESS (Right) - and scrotal abcesses  Patient Location: PACU  Anesthesia Type:MAC  Level of Consciousness: awake, alert  and oriented  Airway & Oxygen Therapy: Patient Spontanous Breathing and Patient connected to face mask oxygen  Post-op Assessment: Report given to PACU RN  Post vital signs: Reviewed and stable  Complications: No apparent anesthesia complications

## 2012-07-07 NOTE — Procedures (Signed)
Patient took goody powder time yesterday

## 2012-07-07 NOTE — H&P (Signed)
Urology History and Physical Exam  CC:  Balanitis. Elevated PSA.  HPI:  52 year old male presents today for elevated PSA. This was 6.4 and on on 06/22/12. Is not associated with frequency, nocturia, or urgency. He has a negative family history of prostate cancer. Rectal examination was benign. His prostate was proximal and 35 g in size. He had not previously been screening for prostate cancer. He presents today for prostate nerve block, transrectal ultrasound, and prostate biopsy. UA 06/22/12 was negative for signs of infection.  She also has balanitis. This is been present for several weeks. He is uncircumcised. It is associated with erythema of the glans. It has not improved appropriately with antifungal creams. We've discussed management options including continued trial of topical medication, circumcision, and dorsal slit. He presents today for circumcision.  The patient presents today with scrotal abscesses and an abscess on his right upper extremity. These develop 3 days ago. There not associated with fever. He's had abscesses in the past on his legs. He does not know what type of bacteria they were. He has 3 abscesses on the scrotum and a large abscess on his right upper extremity overlying the deltoid. Nothing makes it better or worse. Please refer to the physical examination for details regarding abscesses. I explained to the patient that I'm concerned that these abscesses could result in infection of our planned procedures and I advised canceling his prostate biopsy and circumcision and instead treating abscesses. We discussed management options including IV antibiotics versus incision and drainage. I explained to the patient that incision and drainage of an abscess outside the genitourinary system is something that I have done before. We discussed the risk, benefits, and side effects as well as the alternatives. We discussed that his infection could progress. I also explained that he needed better  control of his diabetes to prevent further infection. Other options include referral to the ER or another specialist for incision and drainage of his arm abscess. I advised that we do this under monitored anesthesia care with local anesthetic injection. We discussed the risk and benefits of this.  PMH: Past Medical History  Diagnosis Date  . Diabetes mellitus   . Glaucoma(365)   . Hemorrhoids   . Elevated PSA   . Balanitis   . GERD (gastroesophageal reflux disease)   . Allergic rhinitis   . Frequency of urination   . Nocturia   . Rash     PSH: Past Surgical History  Procedure Laterality Date  . Foot surgery Left 1994 (approx)  . Transthoracic echocardiogram  06-06-2009    NORMAL LVF AND LVSF/ EF 55-60%  . Inguinal hernia repair Right 2000 (approx)    Allergies: Allergies  Allergen Reactions  . Cephalexin Rash    Medications: Prescriptions prior to admission  Medication Sig Dispense Refill  . hydrocortisone (ANUSOL-HC) 2.5 % rectal cream Place rectally 2 (two) times daily.  30 g  0  . loratadine (CLARITIN) 10 MG tablet Take 10 mg by mouth daily.      Marland Kitchen lovastatin (MEVACOR) 10 MG tablet Take 10 mg by mouth at bedtime.      . metFORMIN (GLUCOPHAGE) 500 MG tablet Take 500 mg by mouth every morning.       . travoprost, benzalkonium, (TRAVATAN) 0.004 % ophthalmic solution Place 1 drop into both eyes at bedtime. Pt states rx ran out         Social History: History   Social History  . Marital Status: Single    Spouse  Name: N/A    Number of Children: N/A  . Years of Education: N/A   Occupational History  . Not on file.   Social History Main Topics  . Smoking status: Never Smoker   . Smokeless tobacco: Never Used  . Alcohol Use: No     Comment: occaional  . Drug Use: No  . Sexually Active: Not on file   Other Topics Concern  . Not on file   Social History Narrative  . No narrative on file    Family History: History reviewed. No pertinent family  history.  Review of Systems: Positive: Itching. Negative: Fever, chest pain, or SOB.  A further 10 point review of systems was negative except what is listed in the HPI.  Physical Exam: Filed Vitals:   07/07/12 0730  BP: 145/99  Pulse: 107  Temp: 97.7 F (36.5 C)  Resp: 19    General: No acute distress.  Awake. Head:  Normocephalic.  Atraumatic. ENT:  EOMI.  Mucous membranes moist Neck:  Supple.  No lymphadenopathy. CV:  S1 present. S2 present. Regular rate. Pulmonary: Equal effort bilaterally.  Clear to auscultation bilaterally. Abdomen: Soft.  Non- tender to palpation. Erythematous area without fluctuance on the abdomen; 3 x 4 cm in size. Skin:  Normal turgor.  No visible rash. Extremity: No gross deformity of bilateral upper extremities.  No gross deformity of bilateral lower extremities. RUE abscess overlying deltoid with positive fluctuance and erythema; 3 x 3 cm in size. Neurologic: Alert. Appropriate mood.  Penis:  No lesions. Negative catheter. Scrotum: No ecchymosis.  Positive scrotal abscess with purulent drainage on the left anterior (1.5 x 1.5 cm), left posterior (1 x 1 cm) and right inferior (1 x 1 cm).   Studies:  No results found for this basename: HGB, WBC, PLT,  in the last 72 hours  No results found for this basename: NA, K, CL, CO2, BUN, CREATININE, CALCIUM, MAGNESIUM, GFRNONAA, GFRAA,  in the last 72 hours   No results found for this basename: PT, INR, APTT,  in the last 72 hours   No components found with this basename: ABG,     Assessment:  Scrotal and right arm abscesses. Elevated PSA. Balanitis.  Plan: Proceed to OR for incision and drainage of RUE and scrotal abscesses. Cancel circumcision and prostate biopsy and reschedule after abscesses are treated.  Continue with gentamicin coverage but we will also add vancomycin per pharmacy dosing.

## 2012-07-08 ENCOUNTER — Encounter (HOSPITAL_BASED_OUTPATIENT_CLINIC_OR_DEPARTMENT_OTHER): Payer: Self-pay | Admitting: Urology

## 2012-07-09 LAB — CULTURE, ROUTINE-ABSCESS

## 2012-07-12 LAB — ANAEROBIC CULTURE

## 2012-07-23 ENCOUNTER — Other Ambulatory Visit: Payer: Self-pay | Admitting: Urology

## 2012-07-27 ENCOUNTER — Encounter (HOSPITAL_BASED_OUTPATIENT_CLINIC_OR_DEPARTMENT_OTHER): Payer: Self-pay | Admitting: *Deleted

## 2012-07-27 NOTE — Progress Notes (Signed)
NPO AFTER MN. ARRIVES AT 0915. NEEDS ISTAT. CURRENT EKG IN EPIC AND CHART. WILL DO HIBICLENS SHOWER AM OF SURG  AND DO ONE FLEET ENEMA.

## 2012-08-04 ENCOUNTER — Encounter (HOSPITAL_BASED_OUTPATIENT_CLINIC_OR_DEPARTMENT_OTHER): Payer: Self-pay | Admitting: *Deleted

## 2012-08-04 ENCOUNTER — Encounter (HOSPITAL_BASED_OUTPATIENT_CLINIC_OR_DEPARTMENT_OTHER)
Admission: RE | Disposition: A | Discharge status: Home / Self Care | Payer: Self-pay | Source: Ambulatory Visit | Attending: Urology

## 2012-08-04 ENCOUNTER — Encounter (HOSPITAL_BASED_OUTPATIENT_CLINIC_OR_DEPARTMENT_OTHER): Payer: Self-pay | Admitting: Anesthesiology

## 2012-08-04 ENCOUNTER — Ambulatory Visit (HOSPITAL_BASED_OUTPATIENT_CLINIC_OR_DEPARTMENT_OTHER)
Admission: RE | Admit: 2012-08-04 | Discharge: 2012-08-04 | Disposition: A | Discharge status: Home / Self Care | Payer: Medicaid Other | Source: Ambulatory Visit | Attending: Urology | Admitting: Urology

## 2012-08-04 ENCOUNTER — Ambulatory Visit (HOSPITAL_BASED_OUTPATIENT_CLINIC_OR_DEPARTMENT_OTHER): Payer: Medicaid Other | Admitting: Anesthesiology

## 2012-08-04 DIAGNOSIS — E119 Type 2 diabetes mellitus without complications: Secondary | ICD-10-CM | POA: Insufficient documentation

## 2012-08-04 DIAGNOSIS — C61 Malignant neoplasm of prostate: Secondary | ICD-10-CM | POA: Insufficient documentation

## 2012-08-04 DIAGNOSIS — A4902 Methicillin resistant Staphylococcus aureus infection, unspecified site: Secondary | ICD-10-CM | POA: Insufficient documentation

## 2012-08-04 DIAGNOSIS — R972 Elevated prostate specific antigen [PSA]: Secondary | ICD-10-CM | POA: Insufficient documentation

## 2012-08-04 DIAGNOSIS — N481 Balanitis: Secondary | ICD-10-CM

## 2012-08-04 DIAGNOSIS — Z79899 Other long term (current) drug therapy: Secondary | ICD-10-CM | POA: Insufficient documentation

## 2012-08-04 DIAGNOSIS — N476 Balanoposthitis: Secondary | ICD-10-CM | POA: Insufficient documentation

## 2012-08-04 DIAGNOSIS — K219 Gastro-esophageal reflux disease without esophagitis: Secondary | ICD-10-CM | POA: Insufficient documentation

## 2012-08-04 DIAGNOSIS — N498 Inflammatory disorders of other specified male genital organs: Secondary | ICD-10-CM | POA: Insufficient documentation

## 2012-08-04 HISTORY — PX: CIRCUMCISION: SHX1350

## 2012-08-04 HISTORY — PX: PROSTATE BIOPSY: SHX241

## 2012-08-04 LAB — POCT I-STAT, CHEM 8
Calcium, Ion: 1.21 mmol/L (ref 1.12–1.23)
Chloride: 105 mEq/L (ref 96–112)
Glucose, Bld: 254 mg/dL — ABNORMAL HIGH (ref 70–99)
HCT: 48 % (ref 39.0–52.0)
Hemoglobin: 16.3 g/dL (ref 13.0–17.0)

## 2012-08-04 SURGERY — CIRCUMCISION, ADULT
Anesthesia: General | Wound class: Clean Contaminated

## 2012-08-04 MED ORDER — BUPIVACAINE HCL 0.25 % IJ SOLN
INTRAMUSCULAR | Status: DC | PRN
  Administered 2012-08-04: 10 mL

## 2012-08-04 MED ORDER — LACTATED RINGERS IV SOLN
INTRAVENOUS | Status: DC
  Administered 2012-08-04 (×3): via INTRAVENOUS
  Filled 2012-08-04: qty 1000

## 2012-08-04 MED ORDER — MEPERIDINE HCL 25 MG/ML IJ SOLN
6.2500 mg | INTRAMUSCULAR | Status: DC | PRN
  Filled 2012-08-04: qty 1

## 2012-08-04 MED ORDER — VANCOMYCIN HCL 10 G IV SOLR
1500.0000 mg | Freq: Once | INTRAVENOUS | Status: AC
  Administered 2012-08-04: 1500 mg via INTRAVENOUS
  Filled 2012-08-04: qty 1500

## 2012-08-04 MED ORDER — EPHEDRINE SULFATE 50 MG/ML IJ SOLN
INTRAMUSCULAR | Status: DC | PRN
  Administered 2012-08-04: 25 mg via INTRAVENOUS
  Administered 2012-08-04: 10 mg via INTRAVENOUS

## 2012-08-04 MED ORDER — FENTANYL CITRATE 0.05 MG/ML IJ SOLN
INTRAMUSCULAR | Status: DC | PRN
  Administered 2012-08-04 (×2): 25 ug via INTRAVENOUS
  Administered 2012-08-04: 50 ug via INTRAVENOUS

## 2012-08-04 MED ORDER — LACTATED RINGERS IV SOLN
INTRAVENOUS | Status: DC
  Filled 2012-08-04: qty 1000

## 2012-08-04 MED ORDER — HYDROCODONE-ACETAMINOPHEN 5-325 MG PO TABS: 1.0000 | ORAL_TABLET | ORAL | Status: DC | PRN

## 2012-08-04 MED ORDER — GENTAMICIN SULFATE 40 MG/ML IJ SOLN
500.0000 mg | Freq: Once | INTRAVENOUS | Status: AC
  Administered 2012-08-04: 500 mg via INTRAVENOUS
  Filled 2012-08-04: qty 12.5

## 2012-08-04 MED ORDER — FENTANYL CITRATE 0.05 MG/ML IJ SOLN
25.0000 ug | INTRAMUSCULAR | Status: DC | PRN
  Filled 2012-08-04: qty 1

## 2012-08-04 MED ORDER — SULFAMETHOXAZOLE-TMP DS 800-160 MG PO TABS: 1.0000 | ORAL_TABLET | Freq: Two times a day (BID) | ORAL | Status: DC

## 2012-08-04 MED ORDER — BACITRACIN-NEOMYCIN-POLYMYXIN 400-5-5000 EX OINT
TOPICAL_OINTMENT | Freq: Three times a day (TID) | CUTANEOUS | Status: DC
Start: 2012-08-04 — End: 2012-09-24

## 2012-08-04 MED ORDER — PROPOFOL 10 MG/ML IV BOLUS
INTRAVENOUS | Status: DC | PRN
  Administered 2012-08-04: 300 mg via INTRAVENOUS

## 2012-08-04 MED ORDER — BUPIVACAINE HCL 0.25 % IJ SOLN
INTRAMUSCULAR | Status: DC | PRN
  Administered 2012-08-04: 20 mL

## 2012-08-04 MED ORDER — PROMETHAZINE HCL 25 MG/ML IJ SOLN
6.2500 mg | INTRAMUSCULAR | Status: DC | PRN
  Filled 2012-08-04: qty 1

## 2012-08-04 MED ORDER — GENTAMICIN SULFATE 40 MG/ML IJ SOLN
5.0000 mg/kg | INTRAVENOUS | Status: DC
  Filled 2012-08-04: qty 13.5

## 2012-08-04 MED ORDER — VANCOMYCIN HCL IN DEXTROSE 1-5 GM/200ML-% IV SOLN
1000.0000 mg | INTRAVENOUS | Status: DC
  Filled 2012-08-04: qty 200

## 2012-08-04 MED ORDER — LIDOCAINE HCL (CARDIAC) 20 MG/ML IV SOLN
INTRAVENOUS | Status: DC | PRN
  Administered 2012-08-04: 75 mg via INTRAVENOUS

## 2012-08-04 SURGICAL SUPPLY — 35 items
BANDAGE CONFORM 2  STR LF (GAUZE/BANDAGES/DRESSINGS) ×2 IMPLANT
BLADE SURG 15 STRL LF DISP TIS (BLADE) ×1 IMPLANT
BLADE SURG 15 STRL SS (BLADE) ×1
BLADE SURG ROTATE 9660 (MISCELLANEOUS) ×2 IMPLANT
BNDG COHESIVE 3X5 TAN STRL LF (GAUZE/BANDAGES/DRESSINGS) ×2 IMPLANT
CLOTH BEACON ORANGE TIMEOUT ST (SAFETY) ×2 IMPLANT
COVER MAYO STAND STRL (DRAPES) ×2 IMPLANT
COVER TABLE BACK 60X90 (DRAPES) ×2 IMPLANT
DRAPE PED LAPAROTOMY (DRAPES) ×2 IMPLANT
ELECT REM PT RETURN 9FT ADLT (ELECTROSURGICAL) ×2
ELECTRODE REM PT RTRN 9FT ADLT (ELECTROSURGICAL) ×1 IMPLANT
GAUZE VASELINE 1X8 (GAUZE/BANDAGES/DRESSINGS) ×2 IMPLANT
GLOVE BIO SURGEON STRL SZ7 (GLOVE) ×6 IMPLANT
GLOVE BIOGEL PI IND STRL 7.5 (GLOVE) ×1 IMPLANT
GLOVE BIOGEL PI INDICATOR 7.5 (GLOVE) ×1
GLOVE ECLIPSE 7.0 STRL STRAW (GLOVE) ×4 IMPLANT
GLOVE INDICATOR 7.5 STRL GRN (GLOVE) IMPLANT
GOWN STRL REIN XL XLG (GOWN DISPOSABLE) ×6 IMPLANT
NDL SAFETY ECLIPSE 18X1.5 (NEEDLE) ×1 IMPLANT
NEEDLE HYPO 18GX1.5 BLUNT FILL (NEEDLE) ×2 IMPLANT
NEEDLE HYPO 18GX1.5 SHARP (NEEDLE) ×1
NEEDLE HYPO 22GX1.5 SAFETY (NEEDLE) ×2 IMPLANT
NS IRRIG 500ML POUR BTL (IV SOLUTION) IMPLANT
PACK BASIN DAY SURGERY FS (CUSTOM PROCEDURE TRAY) ×2 IMPLANT
PENCIL BUTTON HOLSTER BLD 10FT (ELECTRODE) ×2 IMPLANT
SURGILUBE 2OZ TUBE FLIPTOP (MISCELLANEOUS) ×2 IMPLANT
SUT CHROMIC 2 0 SH (SUTURE) ×2 IMPLANT
SUT CHROMIC 3 0 PS 2 (SUTURE) IMPLANT
SUT CHROMIC 3 0 SH 27 (SUTURE) ×4 IMPLANT
SUT SILK 2 0 (SUTURE)
SUT SILK 2-0 18XBRD TIE 12 (SUTURE) IMPLANT
SYR CONTROL 10ML LL (SYRINGE) ×4 IMPLANT
TOWEL OR 17X24 6PK STRL BLUE (TOWEL DISPOSABLE) IMPLANT
TRAY DSU PREP LF (CUSTOM PROCEDURE TRAY) ×2 IMPLANT
UNDERPAD 30X30 INCONTINENT (UNDERPADS AND DIAPERS) ×2 IMPLANT

## 2012-08-04 NOTE — Op Note (Signed)
DATE OF PROCEDURE: 08/04/12    OPERATIVE REPORT   SURGEON: Natalia Leatherwood, MD  ASSISTANT: None.   PREOPERATIVE DIAGNOSIS: Elevated PSA. Balanitis POSTOPERATIVE DIAGNOSIS: Same.   PROCEDURE PERFORMED:  Circumcision Penile nerve block. Prostate biopsy.  Transrectal ultrasound imaging with interpretation.  Bilateral prostatic nerve block.   ANESTHETIC: General anesthesia and local nerve block.   LOCAL MEDICATION: 0.25% Marcaine without epinephrine, 30 mL.  DRAINS: None.  COMPLICATIONS: None.   FINDINGS:  Prostate measurement: Volume: 50 cc.  Length: 4.87 cm. Height: 3.58 cm. Width: 5.48 cm.  There were calcifications in the mid portion and apex bilaterally. No hypoechoic areas.  Normal seminal vesicles.  Median lobe present: negative.   COMPLICATIONS: None.   EBL: Minimal  HISTORY OF PRESENT ILLNESS:  52 year old male presents today for circumcision for balanitis and prostate biopsy for elevated PSA.  PROCEDURE: After informed consent was obtained, the patient was taken  to the operating room where he was placed in the supine position.  Monitored anesthesia care was induced and he was placed on his side.  SCDs were in place and turned on before induction of the monitored  anesthesia care. IV antibiotics were infused. A time-out was then  performed in which the correct patient, surgical site, and procedure  were identified and agreed upon by the team.   I then performed a digital rectal examination and estimated his prostate to be 45 g in size. There were no nodules or induration noted.  Next, the rectal ultrasound probe was advanced through the anus into the rectum. The  prostate was well visualized. I scanned the entire prostate and  performed measurements (results listed above).  Next, I performed a prostatic nerve block bilaterally by visualizing the base of the prostate with the junction of the seminal  vesicle on the right side and injecting 4 mL of 0.25%  Marcaine without  epinephrine. Next, I advanced the probe to the apex of the right side  and injected 1 mL. Attention was turned to the left side at the junction of the base of the prostate and the seminal vesicle, and 4 mL were  injected here as well as 1 mL at the left apex giving a total of 10 mL  of injection. Each time before injecting, it did pull back on the  syringe to ensure I was not injecting into the vasculature.   Following this, the standard sextant biopsies were carried out by obtaining six  biopsies on each side at the lateral apex, midportion and base as well  as the medial apex, midportion, and base, bilaterally.    After this was done, there was noted to be no rectal bleeding or any other  complications. The probe was removed. This completed the procedure.   He was then placed in a supine position. Hair was removed from his genitals and his genitals were prepped and draped in the usual sterile fashion. I changed my gown and rescrubbed. Another timeout was performed. I then performed a penile nerve block by injecting quarter percent plain Marcaine at the base of the penis and circumferentially around the penis making sure to withdrawal before injecting to ensure no injection into the vasculature. I then marked the proximal and distal portions of my incisions making sure to put the penis on stretch to avoid removing too much foreskin. After this was done an incision was made circumferentially both proximal and distal as marked previously with a 15 blade scalpel. Bovie electric cautery was then used to remove  the foreskin. Meticulous hemostasis was maintained and then the median raphae along the penis was reapproximated on the ventral surface with a horizontal mattress suture of 2-0 chromic. I then placed 3 other 2-0 chromic at the 12:00 3:00 and 9:00 position. I then placed interrupted 3-0 chromic sutures in between these positions until there was good closure of the skin. The wound  was then dressed within about ointment, Vaseline gauze, Kling wrap and then Koban.  He was placed in back in a supine position. Anesthesia was reversed.  He was taken to the PACU in a stable condition.    All counts were correct at the end of the case.

## 2012-08-04 NOTE — H&P (Signed)
Urology History and Physical Exam  CC: Balanitis. Elevated PSA  HPI: 52 year old male presents for balanitis and elevated PSA. This patient had presented previously for circumcision and prostate biopsy, but upon presentation he was found to have several abscesses of the scrotum as was a large abscess of his right upper extremity. Her performed incision and drainage of all these abscesses in the operating room. He returns today for circumcision for his balanitis as well as a transrectal ultrasound, prostate nerve block, and prostate biopsy for elevated PSA. His balanitis is problem. It is associated with erythema of the glans and foreskin. He is uncircumcised. It was not improved with topical and a fungal medications her steroids. It was not associated with paraphimosis. We discussed management options including continued topical medication, dorsal slit, and circumcision. He is elected for circumcision. We have discussed the risk and benefits of this procedure.  His PSA was 6.49 on 06/22/12. Nothing made it better or worse. He has a benign rectal examination. This is not associated with gross hematuria. Negative family history of prostate cancer. We discussed management options and has elected to proceed with prostate biopsy. We discussed the risks, benefits, and alternatives as well as likelihood of achieving goals. His UA from 07/22/12 was negative for signs of infection. In preparation for surgery he performed a Hibiclens shower he will also receive vancomycin and gentamicin.  PMH: Past Medical History  Diagnosis Date  . Diabetes mellitus   . Glaucoma   . Hemorrhoids   . Elevated PSA   . Balanitis   . GERD (gastroesophageal reflux disease)   . Allergic rhinitis   . Frequency of urination   . Nocturia   . Rash     SCROTAL AREA  . MRSA (methicillin resistant staph aureus) culture positive     SCROTAL ABSCESS 07-07-2012  I & D  W/ CULTURE POSITIVE MRSA--  NOW HEALED    PSH: Past Surgical  History  Procedure Laterality Date  . Foot surgery Left 1994 (approx)  . Transthoracic echocardiogram  06-06-2009    NORMAL LVF AND LVSF/ EF 55-60%  . Inguinal hernia repair Right 2000 (approx)  . Irrigation and debridement abscess Right 07/07/2012    Procedure: IRRIGATION AND DEBRIDEMENT ABSCESS;  Surgeon: Milford Cage, MD;  Location: [facility];  Service: Urology;  Laterality: Right;  and scrotal abcesses    Allergies: Allergies  Allergen Reactions  . Cephalexin Rash    Medications: Prescriptions prior to admission  Medication Sig Dispense Refill  . hydrocortisone (ANUSOL-HC) 2.5 % rectal cream Place rectally 2 (two) times daily.  30 g  0  . loratadine (CLARITIN) 10 MG tablet Take 10 mg by mouth daily.      Marland Kitchen lovastatin (MEVACOR) 10 MG tablet Take 10 mg by mouth at bedtime.      . metFORMIN (GLUCOPHAGE) 500 MG tablet Take 500 mg by mouth every morning.       Marland Kitchen oxyCODONE-acetaminophen (PERCOCET) 5-325 MG per tablet Take 1-2 tablets by mouth every 4 (four) hours as needed for pain.  60 tablet  0  . senna-docusate (SENOKOT S) 8.6-50 MG per tablet Take 1 tablet by mouth 2 (two) times daily.  60 tablet  0  . neomycin-bacitracin-polymyxin (NEOSPORIN) ointment Apply topically as needed. apply to eye      . travoprost, benzalkonium, (TRAVATAN) 0.004 % ophthalmic solution Place 1 drop into both eyes at bedtime. Pt states rx ran out         Social  History: History   Social History  . Marital Status: Single    Spouse Name: N/A    Number of Children: N/A  . Years of Education: N/A   Occupational History  . Not on file.   Social History Main Topics  . Smoking status: Never Smoker   . Smokeless tobacco: Never Used  . Alcohol Use: No     Comment: occaional  . Drug Use: No  . Sexually Active: Not on file   Other Topics Concern  . Not on file   Social History Narrative  . No narrative on file    Family History: History reviewed. No pertinent family  history.  Review of Systems: Positive: None Negative: Fever, SOB, chest pain, or new abscesses.  A further 10 point review of systems was negative except what is listed in the HPI.  Physical Exam: Filed Vitals:   08/04/12 0857  BP: 119/86  Pulse: 79  Temp: 96.9 F (36.1 C)  Resp: 18    General: No acute distress.  Awake. Head:  Normocephalic.  Atraumatic. ENT:  EOMI.  Mucous membranes moist Neck:  Supple.  No lymphadenopathy. CV:  S1 present. S2 present. Regular rate. Pulmonary: Equal effort bilaterally.  Clear to auscultation bilaterally. Abdomen: Soft.  Non- tender to palpation. Skin:  Normal turgor.  No visible rash. Extremity: No gross deformity of bilateral upper extremities.  No gross deformity of    bilateral lower extremities. Neurologic: Alert. Appropriate mood.  Penis:  Uncircumcised.  Positive erythema of glans and prepuce.  Urethra: No Foley catheter in place.  Orthotopic meatus. Scrotum: No lesions.  No ecchymosis.  No erythema. Negative abscesses.   Studies:  No results found for this basename: HGB, WBC, PLT,  in the last 72 hours  No results found for this basename: NA, K, CL, CO2, BUN, CREATININE, CALCIUM, MAGNESIUM, GFRNONAA, GFRAA,  in the last 72 hours   No results found for this basename: PT, INR, APTT,  in the last 72 hours   No components found with this basename: ABG,     Assessment:  Balanitis. Elevated PSA.  Plan: To OR for circumcision, transrectal ultrasound, prostate nerve block, and prostate biopsy.

## 2012-08-04 NOTE — Anesthesia Procedure Notes (Signed)
Procedure Name: LMA Insertion Date/Time: 08/04/2012 9:54 AM Performed by: Fran Lowes Pre-anesthesia Checklist: Patient identified, Emergency Drugs available, Suction available and Patient being monitored Patient Re-evaluated:Patient Re-evaluated prior to inductionOxygen Delivery Method: Circle System Utilized Preoxygenation: Pre-oxygenation with 100% oxygen Intubation Type: IV induction Ventilation: Mask ventilation without difficulty LMA: LMA inserted LMA Size: 5.0 Number of attempts: 1 Airway Equipment and Method: bite block Placement Confirmation: positive ETCO2 Tube secured with: Tape Dental Injury: Teeth and Oropharynx as per pre-operative assessment  Comments: Repositioned x2

## 2012-08-04 NOTE — Anesthesia Preprocedure Evaluation (Signed)
Anesthesia Evaluation  Patient identified by MRN, date of birth, ID band Patient awake    Reviewed: Allergy & Precautions, H&P , NPO status , Patient's Chart, lab work & pertinent test results  Airway Mallampati: II TM Distance: >3 FB Neck ROM: Full    Dental no notable dental hx. (+) Partial Upper   Pulmonary neg pulmonary ROS,  breath sounds clear to auscultation  Pulmonary exam normal       Cardiovascular negative cardio ROS  Rhythm:Regular Rate:Normal     Neuro/Psych negative neurological ROS  negative psych ROS   GI/Hepatic negative GI ROS, Neg liver ROS,   Endo/Other  diabetes, Poorly Controlled, Type 2, Oral Hypoglycemic Agents  Renal/GU negative Renal ROS  negative genitourinary   Musculoskeletal negative musculoskeletal ROS (+)   Abdominal   Peds negative pediatric ROS (+)  Hematology negative hematology ROS (+)   Anesthesia Other Findings   Reproductive/Obstetrics negative OB ROS                           Anesthesia Physical  Anesthesia Plan  ASA: III  Anesthesia Plan: General   Post-op Pain Management:    Induction: Intravenous  Airway Management Planned: LMA  Additional Equipment:   Intra-op Plan:   Post-operative Plan:   Informed Consent: I have reviewed the patients History and Physical, chart, labs and discussed the procedure including the risks, benefits and alternatives for the proposed anesthesia with the patient or authorized representative who has indicated his/her understanding and acceptance.   Dental advisory given  Plan Discussed with: CRNA  Anesthesia Plan Comments:         Anesthesia Quick Evaluation

## 2012-08-04 NOTE — Anesthesia Postprocedure Evaluation (Signed)
  Anesthesia Post-op Note  Patient: Cristian Welch  Procedure(s) Performed: Procedure(s) (LRB): CIRCUMCISION ADULT (N/A) BIOPSY TRANSRECTAL ULTRASONIC PROSTATE (TUBP) (N/A)  Patient Location: PACU  Anesthesia Type: General  Level of Consciousness: awake and alert   Airway and Oxygen Therapy: Patient Spontanous Breathing  Post-op Pain: mild  Post-op Assessment: Post-op Vital signs reviewed, Patient's Cardiovascular Status Stable, Respiratory Function Stable, Patent Airway and No signs of Nausea or vomiting  Last Vitals:  Filed Vitals:   08/04/12 1158  BP: 136/83  Pulse:   Temp:   Resp: 11    Post-op Vital Signs: stable   Complications: No apparent anesthesia complications

## 2012-08-04 NOTE — Transfer of Care (Signed)
Immediate Anesthesia Transfer of Care Note  Patient: Cristian Welch  Procedure(s) Performed: Procedure(s) (LRB): CIRCUMCISION ADULT (N/A) BIOPSY TRANSRECTAL ULTRASONIC PROSTATE (TUBP) (N/A)  Patient Location: Patient transported to PACU with oxygen via face mask at 4 Liters / Min  Anesthesia Type: General  Level of Consciousness: awake and alert   Airway & Oxygen Therapy: Patient Spontanous Breathing and Patient connected to face mask oxygen  Post-op Assessment: Report given to PACU RN and Post -op Vital signs reviewed and stable  Post vital signs: Reviewed and stable  Dentition: Teeth and oropharynx remain in pre-op condition  Complications: No apparent anesthesia complications

## 2012-08-05 ENCOUNTER — Encounter (HOSPITAL_BASED_OUTPATIENT_CLINIC_OR_DEPARTMENT_OTHER): Payer: Self-pay | Admitting: Urology

## 2012-08-19 ENCOUNTER — Other Ambulatory Visit: Payer: Self-pay | Admitting: Urology

## 2012-08-31 ENCOUNTER — Ambulatory Visit: Payer: Medicaid Other | Attending: Urology | Admitting: Physical Therapy

## 2012-09-24 ENCOUNTER — Encounter (HOSPITAL_COMMUNITY): Payer: Self-pay | Admitting: Pharmacy Technician

## 2012-09-28 ENCOUNTER — Other Ambulatory Visit (HOSPITAL_COMMUNITY): Payer: Self-pay | Admitting: Urology

## 2012-09-28 NOTE — Patient Instructions (Addendum)
18 Cristian Welch  09/28/2012   Your procedure is scheduled on: 10-06-2012  Report to [facility] at 600 AM.  Call this number if you have problems the morning of surgery [phone]   Remember:please bring inhaler on the day of surgery    Do not eat food or drink liquids :After Midnight.  FOLLOW ANY BOWEL PREP INSTRUCTIONS FROM DR [facility] OFFICE.   Take these medicines the morning of surgery with A SIP OF WATER: cetirizine, lovastatin, inhaler if needed         Do not wear jewelry, make-up or nail polish.  Do not wear lotions, powders, or perfumes. You may wear deodorant.   Men may shave face and neck.  Do not bring valuables to the hospital.   Contacts, dentures or bridgework may not be worn into surgery.  Leave suitcase in the car. After surgery it may be brought to your room.  For patients admitted to the hospital, checkout time is 11:00 AM the day of discharge.    Please read over the following fact sheets that you were given: MRSA Information, BLOOD FACT SHEET  Call Stacye Noori RN pre op nurse if needed [phone]    FAILURE TO FOLLOW THESE INSTRUCTIONS MAY RESULT IN THE CANCELLATION OF YOUR SURGERY.  PATIENT SIGNATURE___________________________________________  NURSE SIGNATURE_____________________________________________

## 2012-09-28 NOTE — Progress Notes (Signed)
07-07-12 ekg epic

## 2012-09-29 ENCOUNTER — Encounter (HOSPITAL_COMMUNITY): Payer: Self-pay

## 2012-09-29 ENCOUNTER — Encounter (HOSPITAL_COMMUNITY)
Admission: RE | Admit: 2012-09-29 | Discharge: 2012-09-29 | Disposition: A | Discharge status: Home / Self Care | Payer: Medicaid Other | Source: Ambulatory Visit | Attending: Urology | Admitting: Urology

## 2012-09-29 DIAGNOSIS — Z01812 Encounter for preprocedural laboratory examination: Secondary | ICD-10-CM | POA: Insufficient documentation

## 2012-09-29 DIAGNOSIS — C61 Malignant neoplasm of prostate: Secondary | ICD-10-CM | POA: Insufficient documentation

## 2012-09-29 HISTORY — DX: Unspecified osteoarthritis, unspecified site: M19.90

## 2012-09-29 LAB — BASIC METABOLIC PANEL
BUN: 13 mg/dL (ref 6–23)
CO2: 27 mEq/L (ref 19–32)
Calcium: 9.2 mg/dL (ref 8.4–10.5)
Glucose, Bld: 132 mg/dL — ABNORMAL HIGH (ref 70–99)
Sodium: 136 mEq/L (ref 135–145)

## 2012-09-29 LAB — CBC
Hemoglobin: 15 g/dL (ref 13.0–17.0)
MCH: 30.4 pg (ref 26.0–34.0)
MCHC: 34.6 g/dL (ref 30.0–36.0)
MCV: 88 fL (ref 78.0–100.0)
RBC: 4.93 MIL/uL (ref 4.22–5.81)

## 2012-10-05 MED ORDER — GENTAMICIN SULFATE 40 MG/ML IJ SOLN
450.0000 mg | INTRAVENOUS | Status: AC
  Administered 2012-10-06: 450 mg via INTRAVENOUS
  Filled 2012-10-05: qty 11.25

## 2012-10-05 NOTE — Anesthesia Preprocedure Evaluation (Addendum)
Anesthesia Evaluation  Patient identified by MRN, date of birth, ID band Patient awake    Reviewed: Allergy & Precautions, H&P , NPO status , Patient's Chart, lab work & pertinent test results  Airway Mallampati: II TM Distance: >3 FB Neck ROM: Full    Dental no notable dental hx. (+) Partial Upper   Pulmonary shortness of breath and with exertion, asthma , former smoker,  breath sounds clear to auscultation  Pulmonary exam normal       Cardiovascular negative cardio ROS  Rhythm:Regular Rate:Normal     Neuro/Psych negative neurological ROS  negative psych ROS   GI/Hepatic Neg liver ROS, GERD-  ,  Endo/Other  diabetes, Poorly Controlled, Type 2, Oral Hypoglycemic Agents  Renal/GU negative Renal ROS     Musculoskeletal negative musculoskeletal ROS (+)   Abdominal   Peds  Hematology negative hematology ROS (+)   Anesthesia Other Findings   Reproductive/Obstetrics                          Anesthesia Physical  Anesthesia Plan  ASA: III  Anesthesia Plan: General   Post-op Pain Management:    Induction: Intravenous  Airway Management Planned: Oral ETT  Additional Equipment:   Intra-op Plan:   Post-operative Plan: Extubation in OR  Informed Consent: I have reviewed the patients History and Physical, chart, labs and discussed the procedure including the risks, benefits and alternatives for the proposed anesthesia with the patient or authorized representative who has indicated his/her understanding and acceptance.   Dental advisory given  Plan Discussed with: CRNA  Anesthesia Plan Comments:        Anesthesia Quick Evaluation

## 2012-10-06 ENCOUNTER — Ambulatory Visit (HOSPITAL_COMMUNITY): Payer: Medicaid Other | Admitting: Anesthesiology

## 2012-10-06 ENCOUNTER — Encounter (HOSPITAL_COMMUNITY): Payer: Self-pay

## 2012-10-06 ENCOUNTER — Encounter (HOSPITAL_COMMUNITY): Payer: Self-pay | Admitting: Anesthesiology

## 2012-10-06 ENCOUNTER — Encounter (HOSPITAL_COMMUNITY)
Admission: RE | Disposition: A | Discharge status: Home / Self Care | Payer: Self-pay | Source: Ambulatory Visit | Attending: Urology

## 2012-10-06 ENCOUNTER — Observation Stay (HOSPITAL_COMMUNITY)
Admission: RE | Admit: 2012-10-06 | Discharge: 2012-10-07 | Disposition: A | Discharge status: Home / Self Care | Payer: Medicaid Other | Source: Ambulatory Visit | Attending: Urology | Admitting: Urology

## 2012-10-06 DIAGNOSIS — K219 Gastro-esophageal reflux disease without esophagitis: Secondary | ICD-10-CM | POA: Insufficient documentation

## 2012-10-06 DIAGNOSIS — J45990 Exercise induced bronchospasm: Secondary | ICD-10-CM | POA: Insufficient documentation

## 2012-10-06 DIAGNOSIS — E119 Type 2 diabetes mellitus without complications: Secondary | ICD-10-CM | POA: Insufficient documentation

## 2012-10-06 DIAGNOSIS — C61 Malignant neoplasm of prostate: Principal | ICD-10-CM | POA: Insufficient documentation

## 2012-10-06 DIAGNOSIS — Z79899 Other long term (current) drug therapy: Secondary | ICD-10-CM | POA: Insufficient documentation

## 2012-10-06 DIAGNOSIS — Z22322 Carrier or suspected carrier of Methicillin resistant Staphylococcus aureus: Secondary | ICD-10-CM | POA: Insufficient documentation

## 2012-10-06 HISTORY — PX: ROBOT ASSISTED LAPAROSCOPIC RADICAL PROSTATECTOMY: SHX5141

## 2012-10-06 HISTORY — PX: LYMPHADENECTOMY: SHX5960

## 2012-10-06 LAB — HEMOGLOBIN AND HEMATOCRIT, BLOOD: HCT: 40.3 % (ref 39.0–52.0)

## 2012-10-06 LAB — GLUCOSE, CAPILLARY
Glucose-Capillary: 149 mg/dL — ABNORMAL HIGH (ref 70–99)
Glucose-Capillary: 129 mg/dL — ABNORMAL HIGH (ref 70–99)
Glucose-Capillary: 158 mg/dL — ABNORMAL HIGH (ref 70–99)

## 2012-10-06 LAB — TYPE AND SCREEN: Antibody Screen: NEGATIVE

## 2012-10-06 SURGERY — ROBOTIC ASSISTED LAPAROSCOPIC RADICAL PROSTATECTOMY LEVEL 2
Anesthesia: General | Site: Pelvis | Wound class: Contaminated

## 2012-10-06 MED ORDER — ALBUTEROL SULFATE HFA 108 (90 BASE) MCG/ACT IN AERS: 2.0000 | INHALATION_SPRAY | Freq: Four times a day (QID) | RESPIRATORY_TRACT | Status: DC | PRN

## 2012-10-06 MED ORDER — SIMVASTATIN 10 MG PO TABS
10.0000 mg | ORAL_TABLET | Freq: Every day | ORAL | Status: DC
  Administered 2012-10-06: 10 mg via ORAL
  Filled 2012-10-06 (×2): qty 1

## 2012-10-06 MED ORDER — METFORMIN HCL ER 500 MG PO TB24
500.0000 mg | ORAL_TABLET | Freq: Two times a day (BID) | ORAL | Status: DC
  Administered 2012-10-06 – 2012-10-07 (×2): 500 mg via ORAL
  Filled 2012-10-06 (×4): qty 1

## 2012-10-06 MED ORDER — CISATRACURIUM BESYLATE (PF) 10 MG/5ML IV SOLN
INTRAVENOUS | Status: DC | PRN
  Administered 2012-10-06 (×2): 4 mg via INTRAVENOUS
  Administered 2012-10-06: 12 mg via INTRAVENOUS
  Administered 2012-10-06: 2 mg via INTRAVENOUS
  Administered 2012-10-06: 4 mg via INTRAVENOUS
  Administered 2012-10-06 (×2): 2 mg via INTRAVENOUS
  Administered 2012-10-06: 3 mg via INTRAVENOUS
  Administered 2012-10-06: 8 mg via INTRAVENOUS

## 2012-10-06 MED ORDER — PROPOFOL 10 MG/ML IV BOLUS
INTRAVENOUS | Status: DC | PRN
  Administered 2012-10-06: 200 mg via INTRAVENOUS

## 2012-10-06 MED ORDER — TRAVOPROST 0.004 % OP SOLN: 1.0000 [drp] | Freq: Every day | OPHTHALMIC | Status: DC

## 2012-10-06 MED ORDER — HYDROMORPHONE HCL PF 1 MG/ML IJ SOLN
INTRAMUSCULAR | Status: DC | PRN
  Administered 2012-10-06 (×4): 0.5 mg via INTRAVENOUS

## 2012-10-06 MED ORDER — HEPARIN SODIUM (PORCINE) 5000 UNIT/ML IJ SOLN
5000.0000 [IU] | INTRAMUSCULAR | Status: AC
  Administered 2012-10-06: 5000 [IU] via SUBCUTANEOUS
  Filled 2012-10-06: qty 1

## 2012-10-06 MED ORDER — CIPROFLOXACIN HCL 500 MG PO TABS: 500.0000 mg | ORAL_TABLET | Freq: Two times a day (BID) | ORAL | Status: DC

## 2012-10-06 MED ORDER — SODIUM CHLORIDE 0.9 % IV SOLN
INTRAVENOUS | Status: DC | PRN
  Administered 2012-10-06: 08:00:00 via INTRAVENOUS

## 2012-10-06 MED ORDER — INSULIN ASPART 100 UNIT/ML ~~LOC~~ SOLN
0.0000 [IU] | Freq: Three times a day (TID) | SUBCUTANEOUS | Status: DC
  Administered 2012-10-06 – 2012-10-07 (×2): 2 [IU] via SUBCUTANEOUS

## 2012-10-06 MED ORDER — SODIUM CHLORIDE 0.9 % IR SOLN
Status: DC | PRN
  Administered 2012-10-06: 15:00:00

## 2012-10-06 MED ORDER — PROMETHAZINE HCL 25 MG/ML IJ SOLN: 6.2500 mg | INTRAMUSCULAR | Status: DC | PRN

## 2012-10-06 MED ORDER — ACETAMINOPHEN 325 MG PO TABS: 650.0000 mg | ORAL_TABLET | ORAL | Status: DC | PRN

## 2012-10-06 MED ORDER — SUFENTANIL CITRATE 50 MCG/ML IV SOLN
INTRAVENOUS | Status: DC | PRN
  Administered 2012-10-06 (×6): 10 ug via INTRAVENOUS
  Administered 2012-10-06: 20 ug via INTRAVENOUS
  Administered 2012-10-06 (×2): 10 ug via INTRAVENOUS

## 2012-10-06 MED ORDER — INDIGOTINDISULFONATE SODIUM 8 MG/ML IJ SOLN
INTRAMUSCULAR | Status: DC | PRN
  Administered 2012-10-06: 5 mL via INTRAVENOUS

## 2012-10-06 MED ORDER — ONDANSETRON HCL 4 MG/2ML IJ SOLN
INTRAMUSCULAR | Status: DC | PRN
  Administered 2012-10-06: 4 mg via INTRAVENOUS

## 2012-10-06 MED ORDER — BUPIVACAINE LIPOSOME 1.3 % IJ SUSP
Freq: Once | INTRAMUSCULAR | Status: DC
  Filled 2012-10-06 (×2): qty 20

## 2012-10-06 MED ORDER — SODIUM CHLORIDE 0.9 % IV SOLN
INTRAVENOUS | Status: DC
  Administered 2012-10-06 – 2012-10-07 (×2): via INTRAVENOUS

## 2012-10-06 MED ORDER — LIDOCAINE HCL (CARDIAC) 20 MG/ML IV SOLN
INTRAVENOUS | Status: DC | PRN
  Administered 2012-10-06: 100 mg via INTRAVENOUS

## 2012-10-06 MED ORDER — KETOROLAC TROMETHAMINE 15 MG/ML IJ SOLN
15.0000 mg | Freq: Four times a day (QID) | INTRAMUSCULAR | Status: DC
  Administered 2012-10-06 – 2012-10-07 (×3): 15 mg via INTRAVENOUS
  Filled 2012-10-06 (×6): qty 1

## 2012-10-06 MED ORDER — SUCCINYLCHOLINE CHLORIDE 20 MG/ML IJ SOLN
INTRAMUSCULAR | Status: DC | PRN
  Administered 2012-10-06: 100 mg via INTRAVENOUS

## 2012-10-06 MED ORDER — MIDAZOLAM HCL 5 MG/5ML IJ SOLN
INTRAMUSCULAR | Status: DC | PRN
  Administered 2012-10-06: 2 mg via INTRAVENOUS

## 2012-10-06 MED ORDER — BUPIVACAINE LIPOSOME 1.3 % IJ SUSP
20.0000 mL | Freq: Once | INTRAMUSCULAR | Status: DC
  Filled 2012-10-06: qty 20

## 2012-10-06 MED ORDER — GABAPENTIN 100 MG PO CAPS
100.0000 mg | ORAL_CAPSULE | Freq: Three times a day (TID) | ORAL | Status: DC | PRN
  Filled 2012-10-06: qty 1

## 2012-10-06 MED ORDER — EPHEDRINE SULFATE 50 MG/ML IJ SOLN
INTRAMUSCULAR | Status: DC | PRN
  Administered 2012-10-06 (×3): 10 mg via INTRAVENOUS

## 2012-10-06 MED ORDER — INSULIN ASPART 100 UNIT/ML ~~LOC~~ SOLN
4.0000 [IU] | Freq: Three times a day (TID) | SUBCUTANEOUS | Status: DC
  Administered 2012-10-07: 4 [IU] via SUBCUTANEOUS

## 2012-10-06 MED ORDER — LACTATED RINGERS IV SOLN
INTRAVENOUS | Status: DC | PRN
  Administered 2012-10-06 (×2): via INTRAVENOUS

## 2012-10-06 MED ORDER — SODIUM CHLORIDE 0.9 % IV SOLN
1500.0000 mg | INTRAVENOUS | Status: AC
  Administered 2012-10-06: 1500 mg via INTRAVENOUS
  Filled 2012-10-06: qty 1500

## 2012-10-06 MED ORDER — HYDROCODONE-ACETAMINOPHEN 5-325 MG PO TABS: 1.0000 | ORAL_TABLET | ORAL | Status: DC | PRN

## 2012-10-06 MED ORDER — MORPHINE SULFATE 2 MG/ML IJ SOLN
2.0000 mg | INTRAMUSCULAR | Status: DC | PRN
  Administered 2012-10-06: 2 mg via INTRAVENOUS
  Filled 2012-10-06: qty 1

## 2012-10-06 MED ORDER — MEPERIDINE HCL 50 MG/ML IJ SOLN: 6.2500 mg | INTRAMUSCULAR | Status: DC | PRN

## 2012-10-06 MED ORDER — OXYCODONE HCL 5 MG PO TABS: 5.0000 mg | ORAL_TABLET | Freq: Once | ORAL | Status: DC | PRN

## 2012-10-06 MED ORDER — GLYCOPYRROLATE 0.2 MG/ML IJ SOLN
INTRAMUSCULAR | Status: DC | PRN
  Administered 2012-10-06: 0.6 mg via INTRAVENOUS

## 2012-10-06 MED ORDER — HYDROCODONE-ACETAMINOPHEN 5-325 MG PO TABS
1.0000 | ORAL_TABLET | ORAL | Status: DC | PRN
Start: 2012-10-06 — End: 2012-10-07

## 2012-10-06 MED ORDER — DEXTROSE 5 % IV SOLN
INTRAVENOUS | Status: DC | PRN
  Administered 2012-10-06: 09:00:00 via INTRAVENOUS

## 2012-10-06 MED ORDER — NEOSTIGMINE METHYLSULFATE 1 MG/ML IJ SOLN
INTRAMUSCULAR | Status: DC | PRN
  Administered 2012-10-06: 4 mg via INTRAVENOUS

## 2012-10-06 MED ORDER — SODIUM CHLORIDE 0.9 % IR SOLN
Status: DC | PRN
  Administered 2012-10-06: 1000 mL via INTRAVESICAL

## 2012-10-06 MED ORDER — TRAVOPROST (BAK FREE) 0.004 % OP SOLN
1.0000 [drp] | Freq: Every day | OPHTHALMIC | Status: DC
  Administered 2012-10-07: 1 [drp] via OPHTHALMIC
  Filled 2012-10-06: qty 2.5

## 2012-10-06 MED ORDER — SODIUM CHLORIDE 0.9 % IV BOLUS (SEPSIS)
1000.0000 mL | Freq: Once | INTRAVENOUS | Status: AC
  Administered 2012-10-06: 1000 mL via INTRAVENOUS

## 2012-10-06 MED ORDER — HYDROMORPHONE HCL PF 1 MG/ML IJ SOLN: 0.2500 mg | INTRAMUSCULAR | Status: DC | PRN

## 2012-10-06 MED ORDER — LACTATED RINGERS IR SOLN
Status: DC | PRN
  Administered 2012-10-06: 1000 mL

## 2012-10-06 MED ORDER — OXYCODONE HCL 5 MG/5ML PO SOLN
5.0000 mg | Freq: Once | ORAL | Status: DC | PRN
  Filled 2012-10-06: qty 5

## 2012-10-06 SURGICAL SUPPLY — 58 items
APPLIER CLIP 5 13 M/L LIGAMAX5 (MISCELLANEOUS) ×3
CANISTER SUCTION 2500CC (MISCELLANEOUS) ×3 IMPLANT
CATH FOLEY 2WAY SLVR 30CC 16FR (CATHETERS) ×3 IMPLANT
CATH ROBINSON RED A/P 16FR (CATHETERS) ×3 IMPLANT
CATH ROBINSON RED A/P 8FR (CATHETERS) ×3 IMPLANT
CATH TIEMANN FOLEY 18FR 5CC (CATHETERS) ×3 IMPLANT
CHLORAPREP W/TINT 26ML (MISCELLANEOUS) ×3 IMPLANT
CLIP APPLIE 5 13 M/L LIGAMAX5 (MISCELLANEOUS) ×2 IMPLANT
CLIP LIGATING HEM O LOK PURPLE (MISCELLANEOUS) ×9 IMPLANT
CLOTH BEACON ORANGE TIMEOUT ST (SAFETY) ×3 IMPLANT
CORD HIGH FREQUENCY UNIPOLAR (ELECTROSURGICAL) ×3 IMPLANT
CORDS BIPOLAR (ELECTRODE) ×6 IMPLANT
COVER SURGICAL LIGHT HANDLE (MISCELLANEOUS) ×3 IMPLANT
COVER TIP SHEARS 8 DVNC (MISCELLANEOUS) ×4 IMPLANT
COVER TIP SHEARS 8MM DA VINCI (MISCELLANEOUS) ×2
CUTTER ECHEON FLEX ENDO 45 340 (ENDOMECHANICALS) ×3 IMPLANT
DECANTER SPIKE VIAL GLASS SM (MISCELLANEOUS) ×3 IMPLANT
DRAPE SURG IRRIG POUCH 19X23 (DRAPES) ×3 IMPLANT
DRSG TEGADERM 2-3/8X2-3/4 SM (GAUZE/BANDAGES/DRESSINGS) ×12 IMPLANT
DRSG TEGADERM 4X4.75 (GAUZE/BANDAGES/DRESSINGS) ×6 IMPLANT
DRSG TEGADERM 6X8 (GAUZE/BANDAGES/DRESSINGS) ×6 IMPLANT
ELECT REM PT RETURN 9FT ADLT (ELECTROSURGICAL) ×3
ELECTRODE REM PT RTRN 9FT ADLT (ELECTROSURGICAL) ×2 IMPLANT
GAUZE SPONGE 2X2 8PLY STRL LF (GAUZE/BANDAGES/DRESSINGS) ×2 IMPLANT
GAUZE VASELINE 3X9 (GAUZE/BANDAGES/DRESSINGS) ×3 IMPLANT
GLOVE BIOGEL M 7.0 STRL (GLOVE) IMPLANT
GLOVE BIOGEL PI IND STRL 7.0 (GLOVE) ×2 IMPLANT
GLOVE BIOGEL PI IND STRL 7.5 (GLOVE) ×2 IMPLANT
GLOVE BIOGEL PI INDICATOR 7.0 (GLOVE) ×1
GLOVE BIOGEL PI INDICATOR 7.5 (GLOVE) ×1
GLOVE ECLIPSE 7.0 STRL STRAW (GLOVE) ×3 IMPLANT
GLOVE SURG SS PI 6.5 STRL IVOR (GLOVE) ×3 IMPLANT
GOWN PREVENTION PLUS XLARGE (GOWN DISPOSABLE) ×6 IMPLANT
GOWN STRL NON-REIN LRG LVL3 (GOWN DISPOSABLE) ×6 IMPLANT
GOWN STRL REIN XL XLG (GOWN DISPOSABLE) ×6 IMPLANT
HOLDER FOLEY CATH W/STRAP (MISCELLANEOUS) ×3 IMPLANT
IV LACTATED RINGERS 1000ML (IV SOLUTION) ×3 IMPLANT
KIT ACCESSORY DA VINCI DISP (KITS) ×1
KIT ACCESSORY DVNC DISP (KITS) ×2 IMPLANT
NDL SAFETY ECLIPSE 18X1.5 (NEEDLE) ×2 IMPLANT
NEEDLE HYPO 18GX1.5 SHARP (NEEDLE) ×1
PACK ROBOT UROLOGY CUSTOM (CUSTOM PROCEDURE TRAY) ×3 IMPLANT
RELOAD GREEN ECHELON 45 (STAPLE) ×3 IMPLANT
SCRUB PCMX 4 OZ (MISCELLANEOUS) IMPLANT
SET TUBE IRRIG SUCTION NO TIP (IRRIGATION / IRRIGATOR) ×3 IMPLANT
SOLUTION ELECTROLUBE (MISCELLANEOUS) ×3 IMPLANT
SPONGE GAUZE 2X2 STER 10/PKG (GAUZE/BANDAGES/DRESSINGS) ×1
SPONGE LAP 4X18 X RAY DECT (DISPOSABLE) ×3 IMPLANT
SUT V-LOC BARB 180 2/0GR6 GS22 (SUTURE) ×3
SUT VIC AB 2-0 SH 27 (SUTURE) ×3
SUT VIC AB 2-0 SH 27X BRD (SUTURE) ×6 IMPLANT
SUT VICRYL 0 UR6 27IN ABS (SUTURE) ×6 IMPLANT
SUTURE V-LC BRB 180 2/0GR6GS22 (SUTURE) ×2 IMPLANT
SYR 27GX1/2 1ML LL SAFETY (SYRINGE) ×3 IMPLANT
TOWEL OR 17X26 10 PK STRL BLUE (TOWEL DISPOSABLE) ×3 IMPLANT
TOWEL OR NON WOVEN STRL DISP B (DISPOSABLE) ×3 IMPLANT
TROCAR 12M 150ML BLUNT (TROCAR) ×3 IMPLANT
WATER STERILE IRR 1500ML POUR (IV SOLUTION) ×6 IMPLANT

## 2012-10-06 NOTE — Op Note (Signed)
DATE OF PROCEDURE: 10/06/12   OPERATIVE REPORT  SURGEON: Natalia Leatherwood, MD  ASSISTANT:  Pecola Leisure, PA   PREOPERATIVE DIAGNOSIS: Prostate cancer.  POSTOPERATIVE DIAGNOSIS: Prostate cancer.   PROCEDURE PERFORMED:  Robotic-assisted laparoscopic radical prostatectomy Left nerve sparing. Right non-nerve sparing approach.  Bilateral pelvic lymph node dissection.  ESTIMATED BLOOD LOSS: 250 cc.  DRAINS: Jackson-Pratt drain, left lower quadrant and Foley catheter.   SPECIMEN: Prostate sent with seminal vesicles in its entirety. Frozen section of bladder neck x2. Permanent section of bladder neck.  FINDINGS:  Left accessory pundendal artery. Poor surgical planes at the apex and right lateral prostate.   HISTORY OF PRESENT ILLNESS:  52 year old male presents today for prostate cancer discovered based on an elevated PSA. His was Gleason 3+4 equals 7 and 3+3 equals 6. After evaluation and discussion of management options, he elected for surgical extirpation of his prostate.   PROCEDURE IN DETAIL: Informed consent was obtained. He received subcutaneous heparin for DVT prophylaxis before being taken to the OR. The patient was taken to the operating room, where he was placed in the supine position. IV antibiotics were infused and general anesthesia was induced. A time- out was performed, in which the correct patient, surgical site, and procedure were identified and agreed upon by the team. Hair was removed  from his abdomen and genitals and then he was placed in a dorsal  lithotomy position. All pertinent neurovascular pressure points were padded appropriately. His arms were tucked to the side using gel padding. After this, his abdomen and genitals were prepped and draped in the usual sterile fashion. After time was taken to give him a 5 minute surgical scrub in addition to the normal scrub used due to his history of infections. Separate scrub was used for his genitals and 4 his abdomen.  A  Foley catheter was placed on the field, 10 cc of sterile water was placed into the balloon.   He was then placed in a Trendelenburg position. Access was gained for laparoscopic procedure by using the Hasson technique by cutting down to the fascia in the midline above the umbilicus. Once the peritoneum was accessed, a 10 mm  port was placed and abdomen was insufflated with carbon dioxide. Opening pressures were initially low so insufflation was continued. Next, markings were made for placement of the 1st & 3rd robotic arm ports in the usual areas with the ports being placed approximately 10 cm from the midline on either  side of camera (2nd) arm.  A 10 mm assistant port was placed on the far right lateral side of the abdomen. A 5 mm assistant port was placed between the right robotic (1st arm) and the camera port (2nd arm). The 4th robotic arm port was placed at the far left lateral side of the abdomen. All these were placed under direct visualization, and the robot was docked to the robotic ports.  After this was done, the urachal remnant was identified and divided and the bladder was  Dissected from the anterior wall of the abdomen. This was taken down to  the endopelvic fascia bilaterally and the perineum was split down to the  vas deferens bilaterally. The vas deferens was divided bipolar and monopolar electrocautery.   Attention was turned to the right pelvis. The peritoneum was incised until the right external iliac vein was identified. Careful dissection was performed to release the nodal packet lateral to the vein. Dissection was then carried out inferior and medially to the vein until the lateral  pelvic wall was encountered. Gentle dissection was then carried proximally and distally along the vein. The obturator nerve was identified early at its proximal and distal ends. Dissection of this packet was carried out making sure not to cause injury to the obturator nerve. Hemostasis and lymph vessels  were controlled with metallic and Weck surgical clips. The lymph node packet was then removed and sent for permanent pathology.  Attention was turned to the left pelvis. The peritoneum was incised until the left external iliac vein was identified. There was minimal lymph node tissue in the left obturator area. Careful dissection was performed to release the nodal packet medial to the left external iliac artery and lateral the vein. Dissection was then carried out until the lateral pelvic wall was encountered.  Hemostasis and lymph vessels were controlled with metallic and Weck surgical clips. The lymph node packet was then removed and sent for permanent pathology.   After this was done, the prograsp was used to  retract the bladder cephalad. After this was done, the endopelvic fascia was incised  bilaterally and carried anterior and posteriorly bilaterally. The  puboprostatic ligaments were then divided and then the stapler device  was used to divide and ligate the dorsal venous complex.   After this was complete, attention was turned back to the junction between the prostate and bladder neck.  Dissection was carried down between the prostate and the bladder until  the Foley catheter was encountered. It was deflated and then placed  through the dissection site and then anterior traction was placed by  grasping the catheter with the 4th robot arm. The remainder of the bladder neck  was then dissected out, and dissection was carried down posteriorly making sure to avoid reentry into the bladder until the seminal vesicles were encountered.   The seminal vesicles and vas deferens were then dissected out completely bilaterally. After  these were done, they were placed on traction anteriorly and blunt  dissection was carried out between the rectum and the prostate.   Attention was turned to the right prostatic pedicle. It was evaluated and found to have no good surgical plane.  It was felt that  further attempt at nerve sparing on this right side would jeopardize cancer control and a wide dissection of this side was carried out. The right prostatic pedicle was controlled with sharp dissection and Hem-o-lok clips.    Attention was turned to the left side. It was evaluated and found to have a good plane of dissection, there for a  nerve sparing technique was undertaken by incising medial to the neurovascular bundle and releasing it laterally. After this was carried  out, the left prostatic pedicle was controlled with sharp dissection  and Hem-o-lok clips. Dissection was then carried around posterior to  the prostate to the previous dissected area between the rectum and prostate.  It was noted at that point that there were poor surgical plane between the apex of the prostate bilaterally and the wall of the pelvis. I was very careful to avoid dissecting to close to the rectum but also trying to resect away from the prostate as his cancer in his biopsy was located at the apex. Dissection was then carried up to the urethra which was all that remained attaching the prostate to the body. The urethra was  then divided with scissors and the prostate was placed to the side.   The pelvis was irrigated and flushing of air into the rectal catheter showed no air bubbles.  There was some additional bleeding in the prosthetic bed was oversewn with figure-of-eight 2-0 Vicryl's.  The bladder neck was noted to be very small. There also appeared that there could be some prostatic tissue on this. Some of this tissue was removed and sent for frozen section. The first section returned prostate tissue in the second specimen which was deeper returned muscle of bladder. The bladder mucosa was then everted with 2-0 Monocryl sutures to allow for easier anastomosis in the future since his pelvis was so deep and narrow. Anastomosis of the bladder neck to the urethra was carried out. A 3-0 V-lock suture was used to approximate  the posterior bladder to the posterior urethra.   Double-armed Monocryl suture was used in a running fashion to perform the anastomosis. I was careful to ensure mucosal to mucosal approximation.  After this was done, a Foley catheter was placed through the anastomosis and into the bladder and inflated with 20cc of sterile water. It was irrigated and found to be water tight. It was tied down and the suture needles were removed from the body.   A Jackson-Pratt drain was placed through the port of the 4th robot arm. The specimen was placed in the EndoCatch bag. The robot was  undocked and the operating table was leveled. The EndoCatch bag was removed from the body by enlarging  the midline of the umbilicus. All ports were checked and found to be  free of bleeding. The 10 mm assistant port was closed with a suture  passer under direct visualization. The fascia of the umbilical port was close with interrupted vicryl suture in a figure-of-eight fashion until there was no defect palpable. Care was taken to avoid incorporation of intra-abdominal contents into the closure. After this was done, the drain was sutured into place, and the local injection with Exparel was performed. All wounds were irrigated with  sterile antibiotic solution and then closed with staples and then covered with sterile dressing.  Tegaderm and drain gauze was placed over the Jackson-Pratt drain and  Foley catheter was placed to closed drainage. The patient was placed  back in supine position. Anesthesia was reversed. He was taken to PACU  in stable condition. He will be kept overnight for evaluation.    All counts were correct at the end of the case.

## 2012-10-06 NOTE — Progress Notes (Signed)
Post-op check  Patient has abdominal pain which is well controlled with medications.   I explained the surgical findings and the course of the surgery.  Filed Vitals:   10/06/12 1700  BP: 135/89  Pulse: 89  Temp: 97.8 F (36.6 C)  Resp: 16   Gen: NAD, AAO Abd: soft, appropriately TTP, JP serosanguinous. CV: RRR Chest: CTA-B GU: foley draining light blue urine.  A/P: Prostate cancer S/P radical prostatectomy  -Ambulate tonight. -Regular diet. -Will hold anticoagulation due to concern over slow oozing; will use SCD's.

## 2012-10-06 NOTE — Transfer of Care (Signed)
Immediate Anesthesia Transfer of Care Note  Patient: Cristian Welch  Procedure(s) Performed: Procedure(s) (LRB): ROBOTIC ASSISTED LAPAROSCOPIC RADICAL PROSTATECTOMY LEVEL 2 (N/A) WITH BILATERAL PELVIC LYMPH NODE DISSECTION  (Bilateral)  Patient Location: PACU  Anesthesia Type: General  Level of Consciousness: sedated, patient cooperative and responds to stimulaton  Airway & Oxygen Therapy: Patient Spontanous Breathing and Patient connected to face mask oxgen  Post-op Assessment: Report given to PACU RN and Post -op Vital signs reviewed and stable  Post vital signs: Reviewed and stable  Complications: No apparent anesthesia complications

## 2012-10-06 NOTE — Anesthesia Postprocedure Evaluation (Signed)
Anesthesia Post Note  Patient: Cristian Welch  Procedure(s) Performed: Procedure(s) (LRB): ROBOTIC ASSISTED LAPAROSCOPIC RADICAL PROSTATECTOMY LEVEL 2 (N/A) WITH BILATERAL PELVIC LYMPH NODE DISSECTION  (Bilateral)  Anesthesia type: General  Patient location: PACU  Post pain: Pain level controlled  Post assessment: Post-op Vital signs reviewed  Last Vitals:  Filed Vitals:   10/06/12 2108  BP: 122/72  Pulse: 78  Temp: 36.6 C  Resp: 18    Post vital signs: Reviewed  Level of consciousness: sedated  Complications: No apparent anesthesia complications

## 2012-10-06 NOTE — Anesthesia Procedure Notes (Signed)
Procedure Name: Intubation Date/Time: 10/06/2012 8:40 AM Performed by: Leroy Libman L Patient Re-evaluated:Patient Re-evaluated prior to inductionOxygen Delivery Method: Circle system utilized Preoxygenation: Pre-oxygenation with 100% oxygen Intubation Type: IV induction Ventilation: Mask ventilation without difficulty and Oral airway inserted - appropriate to patient size Laryngoscope Size: Miller and 3 Grade View: Grade I Tube type: Oral Tube size: 8.0 mm Number of attempts: 1 Airway Equipment and Method: Stylet Placement Confirmation: ETT inserted through vocal cords under direct vision,  positive ETCO2 and breath sounds checked- equal and bilateral Secured at: 22 cm Tube secured with: Tape Dental Injury: Teeth and Oropharynx as per pre-operative assessment

## 2012-10-06 NOTE — H&P (Signed)
Urology History and Physical Exam  CC: Prostate cancer  HPI:  52 year old male presents today for prostate cancer.  This was discovered based on elevated PSA of 6.49 in April 2014.  This is not associated with gross hematuria or dysuria.  Biopsy revealed Gleason score 4+3 = 7 and 3+3 equal 6 prostate cancer.  His prostate volume on ultrasound was 50 cc.  Staging studies were negative for metastatic disease.  He does have a history of transaminitis likely due to steatosis of the liver.  He has a history of binge drinking. He states his last drink was over 2 weeks ago. He also has a pertinent past medical history of recurrent skin abscesses.  These have involved in many parts of his body including the shoulder in the scrotum.  Bleeding after the surgery he developed small abscesses which she was able to decompress himself and did not require incision and drainage.  Leading up to this surgery he was thoroughly positive for MRSA and was started on Bactroban nasal ointment and he was also instructed to use Hibiclens scrub prior to his surgery today.  After discussing management options for prostate cancer he elected to proceed with surgery.  We have discussed the risks, benefits, alternatives, and likelihood of achieving goals.  He wishes to proceed with radical prostatectomy and bilateral pelvic lymph node dissection.  Nerve sparing will be determined at the time of surgery.  Preoperative Shim score is 24.  Path: Right medial apex 3+3=6 Left medial apex 4+3=7  PMH: Past Medical History  Diagnosis Date  . Diabetes mellitus   . Glaucoma   . Hemorrhoids     hx of  . Elevated PSA   . Balanitis   . GERD (gastroesophageal reflux disease)   . Allergic rhinitis   . Frequency of urination   . Nocturia   . Rash     SCROTAL AREA  . MRSA (methicillin resistant staph aureus) culture positive     SCROTAL ABSCESS 07-07-2012  I & D  W/ CULTURE POSITIVE MRSA--  NOW HEALED  . Asthma     with exercise  .  Arthritis   . Cancer     prostate    PSH: Past Surgical History  Procedure Laterality Date  . Foot surgery Left 1994 (approx)  . Transthoracic echocardiogram  06-06-2009    NORMAL LVF AND LVSF/ EF 55-60%  . Inguinal hernia repair Right 2000 (approx)  . Irrigation and debridement abscess Right 07/07/2012    Procedure: IRRIGATION AND DEBRIDEMENT ABSCESS;  Surgeon: Milford Cage, MD;  Location: [facility];  Service: Urology;  Laterality: Right;  and scrotal abcesses  . Circumcision N/A 08/04/2012    Procedure: CIRCUMCISION ADULT;  Surgeon: Milford Cage, MD;  Location: [facility];  Service: Urology;  Laterality: N/A;  90 MIN ALSO PROSTATE NERVE BLOCK   . Prostate biopsy N/A 08/04/2012    Procedure: BIOPSY TRANSRECTAL ULTRASONIC PROSTATE (TUBP);  Surgeon: Milford Cage, MD;  Location: [facility];  Service: Urology;  Laterality: N/A;    Allergies: Allergies  Allergen Reactions  . Cephalexin Rash    Medications: Prescriptions prior to admission  Medication Sig Dispense Refill  . albuterol (PROVENTIL HFA;VENTOLIN HFA) 108 (90 BASE) MCG/ACT inhaler Inhale 2 puffs into the lungs every 6 (six) hours as needed for wheezing.      . bacitracin-neomycin-polymyxin-hydrocortisone (CORTISPORIN) 1 % ointment Apply 1 application topically 2 (two) times daily.      . cetirizine (ZYRTEC)  10 MG tablet Take 10 mg by mouth daily.      Marland Kitchen gabapentin (NEURONTIN) 100 MG capsule Take 100 mg by mouth 3 (three) times daily as needed (pain).      Marland Kitchen HYDROcodone-acetaminophen (NORCO/VICODIN) 5-325 MG per tablet Take 1-2 tablets by mouth every 4 (four) hours as needed for pain.      Marland Kitchen lovastatin (MEVACOR) 20 MG tablet Take 20 mg by mouth every morning.      . metFORMIN (GLUCOPHAGE-XR) 500 MG 24 hr tablet Take 500 mg by mouth 2 (two) times daily.      . travoprost, benzalkonium, (TRAVATAN) 0.004 % ophthalmic solution Place 1 drop into both eyes  daily. Pt states rx ran out         Social History: History   Social History  . Marital Status: Single    Spouse Name: N/A    Number of Children: N/A  . Years of Education: N/A   Occupational History  . Not on file.   Social History Main Topics  . Smoking status: Former Smoker -- 0.25 packs/day for 20 years    Types: Cigarettes    Quit date: 02/24/2002  . Smokeless tobacco: Never Used  . Alcohol Use: Yes     Comment: social  . Drug Use: Yes    Special: Cocaine, Marijuana, LSD     Comment: last about 15 years ago  . Sexual Activity: Not on file   Other Topics Concern  . Not on file   Social History Narrative  . No narrative on file    Family History: No family history on file.  Review of Systems: Positive: Scrotal wounds. Negative: Fever, chest pain, or SOB.  A further 10 point review of systems was negative except what is listed in the HPI.  Physical Exam: Filed Vitals:   10/06/12 0619  BP: 113/83  Pulse: 84  Temp: 97.1 F (36.2 C)  Resp: 16    General: No acute distress.  Awake. Head:  Normocephalic.  Atraumatic. ENT:  EOMI.  Mucous membranes moist Neck:  Supple.  No lymphadenopathy. CV:  S1 present. S2 present. Regular rate. Pulmonary: Equal effort bilaterally.  Clear to auscultation bilaterally. Abdomen: Soft.  Non- tender to palpation. Skin:  Normal turgor.  No visible rash. Extremity: No gross deformity of bilateral upper extremities.  No gross deformity of    bilateral lower extremities. Neurologic: Alert. Appropriate mood.  GU:  Circumcised.  Right anterior and inferior scrotal skin wounds which are 5mm or less without erythema/fluctuance/or drainage. Studies:  No results found for this basename: HGB, WBC, PLT,  in the last 72 hours  No results found for this basename: NA, K, CL, CO2, BUN, CREATININE, CALCIUM, MAGNESIUM, GFRNONAA, GFRAA,  in the last 72 hours   No results found for this basename: PT, INR, APTT,  in the last 72 hours   No  components found with this basename: ABG,     Assessment:  Prostate cancer.  Plan: To OR for radical robotic prostatectomy and bilateral pelvic lymph node dissection.  Nerve sparing will be determined at the time of surgery.

## 2012-10-06 NOTE — Preoperative (Signed)
Beta Blockers   Reason not to administer Beta Blockers:Not Applicable 

## 2012-10-07 ENCOUNTER — Encounter (HOSPITAL_COMMUNITY): Payer: Self-pay | Admitting: Urology

## 2012-10-07 LAB — BASIC METABOLIC PANEL
BUN: 11 mg/dL (ref 6–23)
Calcium: 8.3 mg/dL — ABNORMAL LOW (ref 8.4–10.5)
Chloride: 104 mEq/L (ref 96–112)
Creatinine, Ser: 0.95 mg/dL (ref 0.50–1.35)
GFR calc Af Amer: >90 mL/min (ref >90)

## 2012-10-07 LAB — HEMOGLOBIN AND HEMATOCRIT, BLOOD
HCT: 37 % — ABNORMAL LOW (ref 39.0–52.0)
Hemoglobin: 12.1 g/dL — ABNORMAL LOW (ref 13.0–17.0)

## 2012-10-07 LAB — GLUCOSE, CAPILLARY: Glucose-Capillary: 150 mg/dL — ABNORMAL HIGH (ref 70–99)

## 2012-10-07 MED ORDER — CIPROFLOXACIN HCL 500 MG PO TABS: 500.0000 mg | ORAL_TABLET | Freq: Two times a day (BID) | ORAL | Status: DC

## 2012-10-07 MED ORDER — BISACODYL 10 MG RE SUPP: 10.0000 mg | Freq: Two times a day (BID) | RECTAL | Status: DC

## 2012-10-07 MED ORDER — SENNOSIDES-DOCUSATE SODIUM 8.6-50 MG PO TABS: 1.0000 | ORAL_TABLET | Freq: Two times a day (BID) | ORAL | Status: DC

## 2012-10-07 MED ORDER — HYDROCODONE-ACETAMINOPHEN 5-325 MG PO TABS: 1.0000 | ORAL_TABLET | ORAL | Status: DC | PRN

## 2012-10-07 MED ORDER — HYOSCYAMINE SULFATE 0.125 MG PO TABS: 0.1250 mg | ORAL_TABLET | ORAL | Status: DC | PRN

## 2012-10-07 MED ORDER — OXYBUTYNIN CHLORIDE 5 MG PO TABS: 5.0000 mg | ORAL_TABLET | Freq: Four times a day (QID) | ORAL | Status: DC | PRN

## 2012-10-07 MED ORDER — CALCIUM CARBONATE ANTACID 500 MG PO CHEW
400.0000 mg | CHEWABLE_TABLET | Freq: Once | ORAL | Status: DC
  Filled 2012-10-07: qty 2

## 2012-10-07 MED ORDER — BISACODYL 10 MG RE SUPP
10.0000 mg | Freq: Two times a day (BID) | RECTAL | Status: DC
  Administered 2012-10-07: 10 mg via RECTAL
  Filled 2012-10-07: qty 1

## 2012-10-07 MED ORDER — BACITRACIN-NEOMYCIN-POLYMYXIN 400-5-5000 EX OINT: TOPICAL_OINTMENT | Freq: Three times a day (TID) | CUTANEOUS | Status: DC

## 2012-10-07 NOTE — Care Management Note (Signed)
    Page 1 of 1   10/07/2012     10:49:49 AM   CARE MANAGEMENT NOTE 10/07/2012  Patient:  Cristian Welch,Cristian Welch   Account Number:  [id number]  Date Initiated:  10/07/2012  Documentation initiated by:  Lanier Clam  Subjective/Objective Assessment:   ADMITTED W/PROSTATE CA.ELEVATED PSA.     Action/Plan:   FROM HOME.   Anticipated DC Date:  10/07/2012   Anticipated DC Plan:  HOME/SELF CARE      DC Planning Services  CM consult      Choice offered to / List presented to:             Status of service:  Completed, signed off Medicare Important Message given?   (If response is "NO", the following Medicare IM given date fields will be blank) Date Medicare IM given:   Date Additional Medicare IM given:    Discharge Disposition:  HOME/SELF CARE  Per UR Regulation:  Reviewed for med. necessity/level of care/duration of stay  If discussed at Long Length of Stay Meetings, dates discussed:    Comments:  10/07/12 Preslei Blakley RN,BSN NCM [phone] S/P ROBOTIC PROSTATECTOMY.D/C HOME NO NEEDS OR ORDERS.

## 2012-10-07 NOTE — Discharge Instructions (Signed)
DISCHARGE INSTRUCTIONS FOR RADICAL PROSTATECTOMY  MEDICATIONS: patient is discharged with:  1. Resume all your other meds from home - except do not take any other pain meds  that you may have at home.  ACTIVITY 1. No heavy lifting >10 pounds for 8 weeks 2. No sexual activity for 6 weeks 3. No strenuous activity for 6 weeks 4. No driving while on narcotic pain medications 5. Drink plenty of water 6. Continue to walk at home - you can still get blood clots when you are at  home, so keep active, but don't over do it. 7. You may resume normal activity in 2 wks (working but no heavy lifting) 8. DO NOT STRAIN TO HAVE A BOWEL MOVEMENT.  DIET 1. Go slow with your diet. You had a big surgery and your appetite will not be  as it was before surgery for quite some time. You may also notice a metallic  taste in your mouth, this should go away in a few weeks. Make sure you stay well  hydrated and drink lots of water. You can buy Boost or Ensure supplements to  drink between meals and any other time as well to make sure you are getting  adequate nutrition. 2. Do not eat a lot of heavy meals (ex hamburgers, steak) right away - you may  get sick to your stomach. 3. Do not strain to have a bowel movement.  If you have not been able to have a  BM after several day, go to your local pharmacy and obtain Mild of Magnesia and  take it as instructed on the bottle.   BATHING/WOUND CARE ***REMOVE YOUR WOUND DRESSINGS ON Friday, 10/08/12.  1. You can shower and we recommend daily showers.  If you have steri strips over your  incision will fall off on their own in a week or so.  If you have staples, they will be removed when your catheter is removed.  2. The JP site will close up on its own in a few days - replace the 4x4 gauze if  it becomes saturated. 3. You do not need to dress your surgical wounds.  Let the steri-strips fall off  on their own. 4. DO NOT SUBMERGE YOUR CATHETER OR WOUND UNDER  WATER.  CATHETER 1. Keep your catheter secured to your leg at all times with tape. 2. You may experience leakage of urine around your catheter- as long as the  catheter continues to drain, this is normal.  If your catheter stops draining  return to the ER, but do not let anyone other than a urologist replace your  catheter. 3. You may also have blood in your urine, even after it has been clear for  several days; you may even pass some small blood clots or other material.  This  is normal as well.  If this happens, sit down and drink plenty of water to help  make urine to flush out your bladder.  If the blood in your urine becomes worse  after doing this, contact our office or return to the ER. 4. You may use the leg bag (small bag) during the day, but use the large bag at  night.  JP Drain Care (if you go home with a JP drain: 1. Keep the drain pinned to your shirt (not your pants because you will forget and pull the drain out when you pull your pants down). 2. Record the output of the drain and keep a total for each  24 hours. Once the total output is less than 50 mL per day, then call clinic to make an appointment to have the drain removed. 3. You may clean around the drain with a damp wash cloth, but make sure you dry it well afterwards. You may place gauze around the drain if there is leakage around the drain. Change the gauze at least once daily. 4. Do not submerge the drain underwater in a bath. Cover the drain site if you take a shower and uncover it after the shower.   SIGNS/SYMPTOMS TO CALL: 1. Please call us if you have a fever greater than 101.5, uncontrolled  nausea/vomiting, uncontrolled pain, dizziness, unable to urinate,   chest pain, shortness of breath, leg swelling, leg pain, or any other concerns  or questions.  You can reach [place] at [phone].

## 2012-10-07 NOTE — Progress Notes (Signed)
Urology Progress Note  Subjective:     No acute urologic events overnight. Pain well controlled w/ PO meds. Negative BM or flatus. Ambulated early without problems. Tolerating regular diet.  ROS: Negative: chest pain or SOB.  Objective:  Patient Vitals for the past 24 hrs:  BP Temp Temp src Pulse Resp SpO2 Height Weight  10/07/12 0519 122/67 mmHg 98.2 F (36.8 C) Oral 85 18 99 % - -  10/07/12 0206 103/62 mmHg 98 F (36.7 C) Oral 68 18 98 % - -  10/06/12 2108 122/72 mmHg 97.8 F (36.6 C) Oral 78 18 98 % - -  10/06/12 1700 135/89 mmHg 97.8 F (36.6 C) - 89 16 98 % 6\' 1"  (1.854 m) 107.5 kg (236 lb 15.9 oz)  10/06/12 1630 142/97 mmHg 97.8 F (36.6 C) - 89 11 99 % - -  10/06/12 1615 146/99 mmHg - - 89 12 96 % - -  10/06/12 1600 154/84 mmHg - - 90 14 100 % - -  10/06/12 1550 146/103 mmHg 97.8 F (36.6 C) - 96 12 100 % - -    Physical Exam: General:  No acute distress, awake Cardiovascular:    [x]   S1/S2 present, RRR  []   Irregularly irregular Chest:  CTA-B Abdomen:               []  Soft, appropriately TTP  []  Soft, NTTP  [x]  Soft, appropriately TTP, incision(s) dressed, JP serosanguinous.  Genitourinary: Negative edema. Foley in place. Foley:  Clear yellow urine.    I/O last 3 completed shifts: In: 2954.2 [I.V.:2954.2] Out: 745 [Urine:450; Drains:45; Blood:250]  Recent Labs     10/06/12  1608  10/07/12  0440  HGB  13.6  12.1*    Recent Labs     10/07/12  0440  NA  137  K  3.7  CL  104  CO2  27  BUN  11  CREATININE  0.95  CALCIUM  8.3*  GFRNONAA  >90  GFRAA  >90     No results found for this basename: PT, INR, APTT,  in the last 72 hours   No components found with this basename: ABG,     Length of stay: 1 days.  Assessment: Prostate cancer. POD#1 Robotic prostatectomy.   Plan: -Replace calcium PO.  -We discussed discharge care and discharge instructions.  -Discontinue JP drain.   Natalia Leatherwood, MD [phone]

## 2012-10-07 NOTE — Discharge Summary (Signed)
Physician Discharge Summary  Patient ID: Cristian Welch MRN: [id number] DOB/AGE: January 17, 1961 52 y.o.  Admit date: 10/06/2012 Discharge date: 10/07/2012  Admission Diagnoses: Prostate cancer  Discharge Diagnoses:  Prostate cancer  Discharged Condition: good  Hospital Course:  This patient was admitted following robotic radical prostatectomy.  He was able to ambulate early and tolerating a regular diet.  He had some mild nausea which resolved.  He did not have a bowel movement or passed flatus prior to discharge.   his JP output was adequately low and this was discontinued.  He was taught Foley catheter care on the nursing staff.  He was instructed to remove his surgical dressings on postoperative day #2.  We discussed his risk for wound infection.  We have gone through procedures to prevent this such as irrigating his wounds with antibiotic solution in the operating room, doing an extra long surgical scrub, pretreating him with Hibiclens shower, and starting him on Bactroban before the surgery.  At this point I feel that giving him a low-dose prophylactic antibiotic with more likely result in resistance rather than help prevent an infection.  Because of this I will have him start antibiotics the day prior to catheter removal and staple removal.  He agrees with this plan.  He was given specific discharge instructions prior to discharge home.  Consults: None  Significant Diagnostic Studies: None  Treatments: IV hydration and surgery: Robotic radical prostatectomy.  Discharge Exam: Blood pressure 122/67, pulse 85, temperature 98.2 F (36.8 C), temperature source Oral, resp. rate 18, height 6\' 1"  (1.854 m), weight 107.5 kg (236 lb 15.9 oz), SpO2 99.00%. Refer to PE from date of discharge.  Disposition: 01-Home or Self Care  Discharge Orders   Future Orders Complete By Expires   Discharge patient  As directed        Medication List    STOP taking these medications       Aspirin-Caffeine  845-65 MG Pack      TAKE these medications       acetaminophen 500 MG tablet  Commonly known as:  TYLENOL  Take 1,000 mg by mouth as needed for pain.     albuterol 108 (90 BASE) MCG/ACT inhaler  Commonly known as:  PROVENTIL HFA;VENTOLIN HFA  Inhale 2 puffs into the lungs every 6 (six) hours as needed for wheezing.     bacitracin-neomycin-polymyxin-hydrocortisone 1 % ointment  Commonly known as:  CORTISPORIN  Apply 1 application topically 2 (two) times daily.     bisacodyl 10 MG suppository  Commonly known as:  DULCOLAX  Place 1 suppository (10 mg total) rectally 2 (two) times daily.     cetirizine 10 MG tablet  Commonly known as:  ZYRTEC  Take 10 mg by mouth daily.     ciprofloxacin 500 MG tablet  Commonly known as:  CIPRO  Take 1 tablet (500 mg total) by mouth 2 (two) times daily. Start day prior to office visit for foley removal     gabapentin 100 MG capsule  Commonly known as:  NEURONTIN  Take 100 mg by mouth 3 (three) times daily as needed (pain).     HYDROcodone-acetaminophen 5-325 MG per tablet  Commonly known as:  NORCO/VICODIN  Take 1-2 tablets by mouth every 4 (four) hours as needed for pain.     hyoscyamine 0.125 MG tablet  Commonly known as:  LEVSIN, ANASPAZ  Take 1 tablet (0.125 mg total) by mouth every 4 (four) hours as needed for cramping (bladder spasms).  lovastatin 20 MG tablet  Commonly known as:  MEVACOR  Take 20 mg by mouth every morning.     metFORMIN 500 MG 24 hr tablet  Commonly known as:  GLUCOPHAGE-XR  Take 500 mg by mouth 2 (two) times daily.     neomycin-bacitracin-polymyxin ointment  Commonly known as:  NEOSPORIN  Apply topically 3 (three) times daily. apply to tip of penis.     oxybutynin 5 MG tablet  Commonly known as:  DITROPAN  Take 1 tablet (5 mg total) by mouth every 6 (six) hours as needed (bladder spasms.).     senna-docusate 8.6-50 MG per tablet  Commonly known as:  SENOKOT S  Take 1 tablet by mouth 2 (two) times  daily.     travoprost (benzalkonium) 0.004 % ophthalmic solution  Commonly known as:  TRAVATAN  Place 1 drop into both eyes daily. Pt states rx ran out           Follow-up Information   Follow up with Milford Cage, MD On 10/18/2012. (10:30 am)    Specialty:  Urology   Contact information:   [address] [place] FLOOR [address], [place] [address] [phone]       Signed: Milford Cage 10/07/2012, 7:31 AM

## 2012-10-07 NOTE — Progress Notes (Signed)
Patient discharge to home, wife at bedside. PIV removed no s/s of infiltration or swelling noted. Foley bag teaching done in the presence of the wife verbalized understanding. D/c instructions and follow up appointment done. Surgical dressing dray and intact. Patient discharge with foley cath to leg bag draining with yellow urine.

## 2013-01-22 ENCOUNTER — Emergency Department (HOSPITAL_COMMUNITY)
Admission: EM | Admit: 2013-01-22 | Discharge: 2013-01-22 | Disposition: A | Discharge status: Home / Self Care | Payer: Medicaid Other | Attending: Emergency Medicine | Admitting: Emergency Medicine

## 2013-01-22 ENCOUNTER — Encounter (HOSPITAL_COMMUNITY): Payer: Self-pay | Admitting: Emergency Medicine

## 2013-01-22 DIAGNOSIS — Z8614 Personal history of Methicillin resistant Staphylococcus aureus infection: Secondary | ICD-10-CM | POA: Insufficient documentation

## 2013-01-22 DIAGNOSIS — M129 Arthropathy, unspecified: Secondary | ICD-10-CM | POA: Insufficient documentation

## 2013-01-22 DIAGNOSIS — Z8679 Personal history of other diseases of the circulatory system: Secondary | ICD-10-CM | POA: Insufficient documentation

## 2013-01-22 DIAGNOSIS — Z87891 Personal history of nicotine dependence: Secondary | ICD-10-CM | POA: Insufficient documentation

## 2013-01-22 DIAGNOSIS — Z8546 Personal history of malignant neoplasm of prostate: Secondary | ICD-10-CM | POA: Insufficient documentation

## 2013-01-22 DIAGNOSIS — Z87448 Personal history of other diseases of urinary system: Secondary | ICD-10-CM | POA: Insufficient documentation

## 2013-01-22 DIAGNOSIS — G43909 Migraine, unspecified, not intractable, without status migrainosus: Secondary | ICD-10-CM | POA: Insufficient documentation

## 2013-01-22 DIAGNOSIS — E119 Type 2 diabetes mellitus without complications: Secondary | ICD-10-CM | POA: Insufficient documentation

## 2013-01-22 DIAGNOSIS — J45990 Exercise induced bronchospasm: Secondary | ICD-10-CM | POA: Insufficient documentation

## 2013-01-22 DIAGNOSIS — Z79899 Other long term (current) drug therapy: Secondary | ICD-10-CM | POA: Insufficient documentation

## 2013-01-22 DIAGNOSIS — Z8719 Personal history of other diseases of the digestive system: Secondary | ICD-10-CM | POA: Insufficient documentation

## 2013-01-22 DIAGNOSIS — Z792 Long term (current) use of antibiotics: Secondary | ICD-10-CM | POA: Insufficient documentation

## 2013-01-22 MED ORDER — METOCLOPRAMIDE HCL 5 MG/ML IJ SOLN
10.0000 mg | Freq: Once | INTRAMUSCULAR | Status: AC
  Administered 2013-01-22: 10 mg via INTRAMUSCULAR
  Filled 2013-01-22: qty 2

## 2013-01-22 MED ORDER — DIPHENHYDRAMINE HCL 50 MG/ML IJ SOLN
25.0000 mg | Freq: Once | INTRAMUSCULAR | Status: AC
  Administered 2013-01-22: 25 mg via INTRAMUSCULAR
  Filled 2013-01-22: qty 1

## 2013-01-22 NOTE — ED Notes (Signed)
Pt states that he has had a headache since Thursday "after he ate some ham".  C/o pain in his forehead.  States he gets "shots every 4 or 5 years for it".

## 2013-01-22 NOTE — ED Provider Notes (Signed)
CSN: [id number]     Arrival date & time 01/22/13  4098 History   First MD Initiated Contact with Patient 01/22/13 [phone]     Chief Complaint  Patient presents with  . Headache   (Consider location/radiation/quality/duration/timing/severity/associated sxs/prior Treatment) Patient is a 52 y.o. male presenting with headaches. The history is provided by the patient.  Headache Pain location:  Frontal Quality:  Dull Radiates to:  Does not radiate Severity currently:  7/10 Severity at highest:  7/10 Onset quality:  Gradual Duration:  3 days Timing:  Constant Progression:  Unchanged Chronicity:  Recurrent Similar to prior headaches: yes   Context: exposure to cold air   Context: not exposure to bright light, not defecating, not eating and not loud noise   Relieved by:  Nothing Worsened by:  Nothing tried Ineffective treatments:  Acetaminophen and resting in a darkened room Associated symptoms: no abdominal pain, no back pain, no blurred vision, no congestion, no cough, no fever, no focal weakness, no nausea, no neck pain, no neck stiffness, no numbness, no paresthesias, no photophobia, no visual change, no vomiting and no weakness   Risk factors comment:  Hx of migraines.  pain started after eating ham at thanksgiving   Past Medical History  Diagnosis Date  . Diabetes mellitus   . Glaucoma   . Hemorrhoids     hx of  . Elevated PSA   . Balanitis   . GERD (gastroesophageal reflux disease)   . Allergic rhinitis   . Frequency of urination   . Nocturia   . Rash     SCROTAL AREA  . MRSA (methicillin resistant staph aureus) culture positive     SCROTAL ABSCESS 07-07-2012  I & D  W/ CULTURE POSITIVE MRSA--  NOW HEALED  . Asthma     with exercise  . Arthritis   . Cancer     prostate   Past Surgical History  Procedure Laterality Date  . Foot surgery Left 1994 (approx)  . Transthoracic echocardiogram  06-06-2009    NORMAL LVF AND LVSF/ EF 55-60%  . Inguinal hernia repair Right  2000 (approx)  . Irrigation and debridement abscess Right 07/07/2012    Procedure: IRRIGATION AND DEBRIDEMENT ABSCESS;  Surgeon: Milford Cage, MD;  Location: [facility];  Service: Urology;  Laterality: Right;  and scrotal abcesses  . Circumcision N/A 08/04/2012    Procedure: CIRCUMCISION ADULT;  Surgeon: Milford Cage, MD;  Location: [facility];  Service: Urology;  Laterality: N/A;  90 MIN ALSO PROSTATE NERVE BLOCK   . Prostate biopsy N/A 08/04/2012    Procedure: BIOPSY TRANSRECTAL ULTRASONIC PROSTATE (TUBP);  Surgeon: Milford Cage, MD;  Location: [facility];  Service: Urology;  Laterality: N/A;  . Robot assisted laparoscopic radical prostatectomy N/A 10/06/2012    Procedure: ROBOTIC ASSISTED LAPAROSCOPIC RADICAL PROSTATECTOMY LEVEL 2;  Surgeon: Milford Cage, MD;  Location: [facility];  Service: Urology;  Laterality: N/A;  . Lymphadenectomy Bilateral 10/06/2012    Procedure: WITH BILATERAL PELVIC LYMPH NODE DISSECTION ;  Surgeon: Milford Cage, MD;  Location: [facility];  Service: Urology;  Laterality: Bilateral;   No family history on file. History  Substance Use Topics  . Smoking status: Former Smoker -- 0.25 packs/day for 20 years    Types: Cigarettes    Quit date: 02/24/2002  . Smokeless tobacco: Never Used  . Alcohol Use: Yes     Comment: social    Review of Systems  Constitutional: Negative  for fever.  HENT: Negative for congestion.   Eyes: Negative for blurred vision and photophobia.  Respiratory: Negative for cough.   Gastrointestinal: Negative for nausea, vomiting and abdominal pain.  Musculoskeletal: Negative for back pain, neck pain and neck stiffness.  Neurological: Positive for headaches. Negative for focal weakness, numbness and paresthesias.  All other systems reviewed and are negative.    Allergies  Cephalexin  Home Medications   Current Outpatient Rx  Name  Route  Sig  Dispense   Refill  . acetaminophen (TYLENOL) 500 MG tablet   Oral   Take 1,000 mg by mouth as needed for pain.         Marland Kitchen albuterol (PROVENTIL HFA;VENTOLIN HFA) 108 (90 BASE) MCG/ACT inhaler   Inhalation   Inhale 2 puffs into the lungs every 6 (six) hours as needed for wheezing.         . bacitracin-neomycin-polymyxin-hydrocortisone (CORTISPORIN) 1 % ointment   Topical   Apply 1 application topically 2 (two) times daily.         . bisacodyl (DULCOLAX) 10 MG suppository   Rectal   Place 1 suppository (10 mg total) rectally 2 (two) times daily.   12 suppository   2   . cetirizine (ZYRTEC) 10 MG tablet   Oral   Take 10 mg by mouth daily.         . ciprofloxacin (CIPRO) 500 MG tablet   Oral   Take 1 tablet (500 mg total) by mouth 2 (two) times daily. Start day prior to office visit for foley removal   6 tablet   0   . gabapentin (NEURONTIN) 100 MG capsule   Oral   Take 100 mg by mouth 3 (three) times daily as needed (pain).         Marland Kitchen HYDROcodone-acetaminophen (NORCO/VICODIN) 5-325 MG per tablet   Oral   Take 1-2 tablets by mouth every 4 (four) hours as needed for pain.   50 tablet   0   . hyoscyamine (LEVSIN, ANASPAZ) 0.125 MG tablet   Oral   Take 1 tablet (0.125 mg total) by mouth every 4 (four) hours as needed for cramping (bladder spasms).   40 tablet   4   . lovastatin (MEVACOR) 20 MG tablet   Oral   Take 20 mg by mouth every morning.         . metFORMIN (GLUCOPHAGE-XR) 500 MG 24 hr tablet   Oral   Take 500 mg by mouth 2 (two) times daily.         Marland Kitchen neomycin-bacitracin-polymyxin (NEOSPORIN) ointment   Topical   Apply topically 3 (three) times daily. apply to tip of penis.   15 g   0   . oxybutynin (DITROPAN) 5 MG tablet   Oral   Take 1 tablet (5 mg total) by mouth every 6 (six) hours as needed (bladder spasms.).   40 tablet   4   . senna-docusate (SENOKOT S) 8.6-50 MG per tablet   Oral   Take 1 tablet by mouth 2 (two) times daily.   60 tablet    0   . travoprost, benzalkonium, (TRAVATAN) 0.004 % ophthalmic solution   Both Eyes   Place 1 drop into both eyes daily. Pt states rx ran out          BP 117/83  Pulse 83  Temp(Src) 98.1 F (36.7 C) (Oral)  Resp 16  SpO2 99% Physical Exam  Nursing note and vitals reviewed. Constitutional: He is oriented to  person, place, and time. He appears well-developed and well-nourished. No distress.  HENT:  Head: Normocephalic and atraumatic.  Mouth/Throat: Oropharynx is clear and moist.  Eyes: Conjunctivae and EOM are normal. Pupils are equal, round, and reactive to light.  No photophobia  Neck: Normal range of motion. Neck supple.  Cardiovascular: Normal rate, regular rhythm and intact distal pulses.   No murmur heard. Pulmonary/Chest: Effort normal and breath sounds normal. No respiratory distress. He has no wheezes. He has no rales.  Abdominal: Soft. He exhibits no distension. There is no tenderness. There is no rebound and no guarding.  Musculoskeletal: Normal range of motion. He exhibits no edema and no tenderness.  Neurological: He is alert and oriented to person, place, and time. He has normal strength. No cranial nerve deficit or sensory deficit.  Skin: Skin is warm and dry. No rash noted. No erythema.  Psychiatric: He has a normal mood and affect. His behavior is normal.    ED Course  Procedures (including critical care time) Labs Review Labs Reviewed - No data to display Imaging Review No results found.  EKG Interpretation   None       MDM  No diagnosis found.  Pt with hx of migraines with his typical migraine HA since thrusday without sx suggestive of SAH(sudden onset, worst of life, or deficits), infection, or cavernous vein thrombosis.  Normal neuro exam and vital signs.  Denies allergy sx or neck pain/stiffness. Will give HA cocktail and on re-eval pt feels much better and d/ced home.     Gwyneth Sprout, MD 01/22/13 1053

## 2013-01-23 ENCOUNTER — Emergency Department (HOSPITAL_COMMUNITY)
Admission: EM | Admit: 2013-01-23 | Discharge: 2013-01-23 | Disposition: A | Discharge status: Home / Self Care | Payer: Medicaid Other | Attending: Emergency Medicine | Admitting: Emergency Medicine

## 2013-01-23 ENCOUNTER — Encounter (HOSPITAL_COMMUNITY): Payer: Self-pay | Admitting: Emergency Medicine

## 2013-01-23 DIAGNOSIS — Z8719 Personal history of other diseases of the digestive system: Secondary | ICD-10-CM | POA: Insufficient documentation

## 2013-01-23 DIAGNOSIS — Z8679 Personal history of other diseases of the circulatory system: Secondary | ICD-10-CM | POA: Insufficient documentation

## 2013-01-23 DIAGNOSIS — E119 Type 2 diabetes mellitus without complications: Secondary | ICD-10-CM | POA: Insufficient documentation

## 2013-01-23 DIAGNOSIS — Z79899 Other long term (current) drug therapy: Secondary | ICD-10-CM | POA: Insufficient documentation

## 2013-01-23 DIAGNOSIS — Z8546 Personal history of malignant neoplasm of prostate: Secondary | ICD-10-CM | POA: Insufficient documentation

## 2013-01-23 DIAGNOSIS — M129 Arthropathy, unspecified: Secondary | ICD-10-CM | POA: Insufficient documentation

## 2013-01-23 DIAGNOSIS — Z8669 Personal history of other diseases of the nervous system and sense organs: Secondary | ICD-10-CM | POA: Insufficient documentation

## 2013-01-23 DIAGNOSIS — R51 Headache: Secondary | ICD-10-CM | POA: Insufficient documentation

## 2013-01-23 DIAGNOSIS — Z87448 Personal history of other diseases of urinary system: Secondary | ICD-10-CM | POA: Insufficient documentation

## 2013-01-23 DIAGNOSIS — Z87891 Personal history of nicotine dependence: Secondary | ICD-10-CM | POA: Insufficient documentation

## 2013-01-23 DIAGNOSIS — J45909 Unspecified asthma, uncomplicated: Secondary | ICD-10-CM | POA: Insufficient documentation

## 2013-01-23 MED ORDER — SODIUM CHLORIDE 0.9 % IV BOLUS (SEPSIS)
1000.0000 mL | Freq: Once | INTRAVENOUS | Status: AC
  Administered 2013-01-23: 1000 mL via INTRAVENOUS

## 2013-01-23 MED ORDER — DEXAMETHASONE SODIUM PHOSPHATE 10 MG/ML IJ SOLN
10.0000 mg | Freq: Once | INTRAMUSCULAR | Status: AC
  Administered 2013-01-23: 10 mg via INTRAVENOUS
  Filled 2013-01-23: qty 1

## 2013-01-23 MED ORDER — PROCHLORPERAZINE EDISYLATE 5 MG/ML IJ SOLN
10.0000 mg | Freq: Once | INTRAMUSCULAR | Status: AC
  Administered 2013-01-23: 10 mg via INTRAVENOUS
  Filled 2013-01-23: qty 2

## 2013-01-23 MED ORDER — METOCLOPRAMIDE HCL 5 MG/ML IJ SOLN
10.0000 mg | Freq: Once | INTRAMUSCULAR | Status: AC
  Administered 2013-01-23: 10 mg via INTRAVENOUS
  Filled 2013-01-23: qty 2

## 2013-01-23 MED ORDER — DIPHENHYDRAMINE HCL 50 MG/ML IJ SOLN
25.0000 mg | Freq: Once | INTRAMUSCULAR | Status: AC
  Administered 2013-01-23: 25 mg via INTRAVENOUS
  Filled 2013-01-23: qty 1

## 2013-01-23 NOTE — ED Notes (Signed)
Pt reports he was seen and treated in ED yesterday for headache, reports he got a shot for pain, eased pain, but did not fully relieve pain. Pt reports pain has persisted. At present pain 8/10. Pt denies dizziness or blurry vision.

## 2013-01-23 NOTE — ED Provider Notes (Signed)
CSN: [id number]     Arrival date & time 01/23/13  3086 History   First MD Initiated Contact with Patient 01/23/13 [phone]     Chief Complaint  Patient presents with  . Headache   (Consider location/radiation/quality/duration/timing/severity/associated sxs/prior Treatment) HPI Comments: Patient is a 52 year old male who presents today for headache. Headache began gradually on Friday. It is afebrile having pain, worse with certain movements. He was seen in the emergency department yesterday for his headache. He initially felt better after receiving Reglan and Benadryl, but these medications wore off and he was unable to sleep last night due to the pain. He reports that he has history of migraines that he gets every 8-10 years. He thinks this feels like a typical migraine. He denies any visual disturbances, photophobia, fever, chills, congestion, rhinorrhea, sore throat, nausea, vomiting, abdominal pain, weakness.  Patient is a 52 y.o. male presenting with headaches. The history is provided by the patient. No language interpreter was used.  Headache Associated symptoms: no abdominal pain, no dizziness, no fever, no nausea, no photophobia and no vomiting     Past Medical History  Diagnosis Date  . Diabetes mellitus   . Glaucoma   . Hemorrhoids     hx of  . Elevated PSA   . Balanitis   . GERD (gastroesophageal reflux disease)   . Allergic rhinitis   . Frequency of urination   . Nocturia   . Rash     SCROTAL AREA  . MRSA (methicillin resistant staph aureus) culture positive     SCROTAL ABSCESS 07-07-2012  I & D  W/ CULTURE POSITIVE MRSA--  NOW HEALED  . Asthma     with exercise  . Arthritis   . Cancer     prostate   Past Surgical History  Procedure Laterality Date  . Foot surgery Left 1994 (approx)  . Transthoracic echocardiogram  06-06-2009    NORMAL LVF AND LVSF/ EF 55-60%  . Inguinal hernia repair Right 2000 (approx)  . Irrigation and debridement abscess Right 07/07/2012   Procedure: IRRIGATION AND DEBRIDEMENT ABSCESS;  Surgeon: Milford Cage, MD;  Location: [facility];  Service: Urology;  Laterality: Right;  and scrotal abcesses  . Circumcision N/A 08/04/2012    Procedure: CIRCUMCISION ADULT;  Surgeon: Milford Cage, MD;  Location: [facility];  Service: Urology;  Laterality: N/A;  90 MIN ALSO PROSTATE NERVE BLOCK   . Prostate biopsy N/A 08/04/2012    Procedure: BIOPSY TRANSRECTAL ULTRASONIC PROSTATE (TUBP);  Surgeon: Milford Cage, MD;  Location: [facility];  Service: Urology;  Laterality: N/A;  . Robot assisted laparoscopic radical prostatectomy N/A 10/06/2012    Procedure: ROBOTIC ASSISTED LAPAROSCOPIC RADICAL PROSTATECTOMY LEVEL 2;  Surgeon: Milford Cage, MD;  Location: [facility];  Service: Urology;  Laterality: N/A;  . Lymphadenectomy Bilateral 10/06/2012    Procedure: WITH BILATERAL PELVIC LYMPH NODE DISSECTION ;  Surgeon: Milford Cage, MD;  Location: [facility];  Service: Urology;  Laterality: Bilateral;   History reviewed. No pertinent family history. History  Substance Use Topics  . Smoking status: Former Smoker -- 0.25 packs/day for 20 years    Types: Cigarettes    Quit date: 02/24/2002  . Smokeless tobacco: Never Used  . Alcohol Use: Yes     Comment: social    Review of Systems  Constitutional: Negative for fever, chills and diaphoresis.  Eyes: Negative for photophobia and visual disturbance.  Respiratory: Negative for shortness of breath.  Cardiovascular: Negative for chest pain.  Gastrointestinal: Negative for nausea, vomiting and abdominal pain.  Neurological: Positive for headaches. Negative for dizziness, weakness and light-headedness.  All other systems reviewed and are negative.    Allergies  Cephalexin  Home Medications   Current Outpatient Rx  Name  Route  Sig  Dispense  Refill  . acetaminophen (TYLENOL) 500 MG tablet   Oral   Take 1,000 mg by  mouth every 6 (six) hours as needed for mild pain or moderate pain.          Marland Kitchen albuterol (PROVENTIL HFA;VENTOLIN HFA) 108 (90 BASE) MCG/ACT inhaler   Inhalation   Inhale 2 puffs into the lungs every 6 (six) hours as needed for wheezing.         . cetirizine (ZYRTEC) 10 MG tablet   Oral   Take 10 mg by mouth daily.         . Chlorphen-Phenyleph-ASA (ALKA-SELTZER PLUS COLD PO)   Oral   Take 1 tablet by mouth every 6 (six) hours as needed (cold/flu).         Marland Kitchen lovastatin (MEVACOR) 20 MG tablet   Oral   Take 20 mg by mouth every morning.         . metFORMIN (GLUCOPHAGE-XR) 500 MG 24 hr tablet   Oral   Take 500 mg by mouth 2 (two) times daily.         . tadalafil (CIALIS) 5 MG tablet   Oral   Take 5 mg by mouth daily.          BP 121/79  Pulse 83  Temp(Src) 97.5 F (36.4 C) (Oral)  Resp 16  SpO2 98% Physical Exam  Nursing note and vitals reviewed. Constitutional: He is oriented to person, place, and time. He appears well-developed and well-nourished. No distress.  HENT:  Head: Normocephalic and atraumatic.  Right Ear: Tympanic membrane, external ear and ear canal normal.  Left Ear: Tympanic membrane, external ear and ear canal normal.  Nose: Nose normal. Right sinus exhibits no maxillary sinus tenderness and no frontal sinus tenderness. Left sinus exhibits no maxillary sinus tenderness and no frontal sinus tenderness.  Mouth/Throat: Uvula is midline, oropharynx is clear and moist and mucous membranes are normal.  No temporal artery tenderness Inflamed nasal turbinates bilaterally.   Eyes: Conjunctivae and EOM are normal. Pupils are equal, round, and reactive to light.  Neck: Trachea normal, normal range of motion and phonation normal. No tracheal deviation present.  No nuchal rigidity or meningeal signs  Cardiovascular: Normal rate, regular rhythm and normal heart sounds.   Pulmonary/Chest: Effort normal and breath sounds normal. No stridor.  Abdominal: Soft.  He exhibits no distension. There is no tenderness.  Musculoskeletal: Normal range of motion.  Neurological: He is alert and oriented to person, place, and time. He has normal strength. No sensory deficit. Coordination and gait normal.  Finger-nose-finger normal. Rapid alternating movements normal. Heel-knee-shin normal.   Skin: Skin is warm and dry. He is not diaphoretic.  Psychiatric: He has a normal mood and affect. His behavior is normal.    ED Course  Procedures (including critical care time) Labs Review Labs Reviewed - No data to display Imaging Review No results found.  EKG Interpretation   None      10:13 AM Patient feels improved and would like to go home.   MDM   1. Headache    Pt HA treated and improved while in ED.  Presentation is like pts typical HA  and non concerning for [facility], ICH, Meningitis, or temporal arteritis. Pt is afebrile with no focal neuro deficits, nuchal rigidity, or change in vision. Pt is to follow up with PCP to discuss prophylactic medication. I have also discussed with patient that he should begin taking his nasal spray as there may be a sinus component to his headache. No indication for abx at this time. Pt verbalizes understanding and is agreeable with plan to dc.   Medications  sodium chloride 0.9 % bolus 1,000 mL (1,000 mLs Intravenous New Bag/Given 01/23/13 0927)  diphenhydrAMINE (BENADRYL) injection 25 mg (25 mg Intravenous Given 01/23/13 0927)  metoCLOPramide (REGLAN) injection 10 mg (10 mg Intravenous Given 01/23/13 0928)  dexamethasone (DECADRON) injection 10 mg (10 mg Intravenous Given 01/23/13 0927)  prochlorperazine (COMPAZINE) injection 10 mg (10 mg Intravenous Given 01/23/13 0927)       Mora Bellman, PA-C 01/23/13 1015

## 2013-01-25 NOTE — ED Provider Notes (Signed)
Medical screening examination/treatment/procedure(s) were performed by non-physician practitioner and as supervising physician I was immediately available for consultation/collaboration.  EKG Interpretation   None        Raeford Razor, MD 01/25/13 [phone]

## 2013-09-26 ENCOUNTER — Other Ambulatory Visit (HOSPITAL_COMMUNITY): Payer: Self-pay | Admitting: Internal Medicine

## 2013-09-26 ENCOUNTER — Ambulatory Visit (HOSPITAL_COMMUNITY)
Admission: RE | Admit: 2013-09-26 | Discharge: 2013-09-26 | Disposition: A | Discharge status: Home / Self Care | Payer: Medicaid Other | Source: Ambulatory Visit | Attending: Internal Medicine | Admitting: Internal Medicine

## 2013-09-26 DIAGNOSIS — M545 Low back pain, unspecified: Secondary | ICD-10-CM

## 2013-11-21 ENCOUNTER — Other Ambulatory Visit (INDEPENDENT_AMBULATORY_CARE_PROVIDER_SITE_OTHER): Payer: Self-pay | Admitting: General Surgery

## 2013-11-21 NOTE — H&P (Signed)
Cristian Welch 11/21/2013 1:49 PM Location: [facility] Patient #: [id number] DOB: [birth year] Single / Language: Cristian Welch / Race: Black or African American Male History of Present Illness Cristian Ruff MD; 7/56/4332 2:18 PM) Patient words: hemorrhoids.  The patient is a 53 year old male who presents with anal pain. The last clinic visit was 6 month(s) ago. Management changes made at the last visit include adding fiber. Symptoms include anal pain and rectal bleeding. The patient describes the pain as sharp, aching and throbbing. The episodes occur 1 times a day. The patient describes this as moderate in severity and worsening. Past evaluation has included anoscopy (6 months ago by Dr Cristian Welch) and colonoscopy (5 yrs ago and normal per pt). Other Problems Cristian Welch [place], [place]; 11/21/2013 1:49 PM) Cancer Diabetes Mellitus Hemorrhoids High blood pressure Hypercholesterolemia  Past Surgical History Cristian Welch [place], [place]; 11/21/2013 1:49 PM) Prostate Surgery - Removal  Allergies (Cristian Welch, Welch; 11/21/2013 1:51 PM) Cephalexin *CEPHALOSPORINS*  Medication History (Cristian Welch, Welch; 11/21/2013 1:54 PM) Acetaminophen (500MG  Capsule, Oral) Active. Albuterol Sulfate HFA (108 (90 Base)MCG/ACT Aerosol Soln, Inhalation) Active. ZyrTEC Allergy (10MG  Capsule, Oral) Active. Lovastatin (20MG  Tablet, Oral) Active. MetFORMIN HCl (1000MG  Tablet, Oral) Active. Lisinopril (5MG  Tablet, Oral) Active. TiZANidine HCl (4MG  Capsule, Oral) Active. Diclofenac Sodium (75MG  Tablet DR, Oral) Active. Medications Reconciled  Social History Cristian Welch [place], [place]; 11/21/2013 1:49 PM) Alcohol use Remotely quit alcohol use. Illicit drug use Remotely quit drug use.  Family History Cristian Welch [place], [place]; 11/21/2013 1:49 PM) Alcohol Abuse Father, Mother.     Review of Systems (Cristian Welch; 11/21/2013 1:49 PM) General Not Present-  Appetite Loss, Chills, Fatigue, Fever, Night Sweats, Weight Gain and Weight Loss. Skin Not Present- Change in Wart/Mole, Dryness, Hives, Jaundice, Cristian Lesions, Non-Healing Wounds, Rash and Ulcer. HEENT Not Present- Earache, Hearing Loss, Hoarseness, Nose Bleed, Oral Ulcers, Ringing in the Ears, Seasonal Allergies, Sinus Pain, Sore Throat, Visual Disturbances, Wears glasses/contact lenses and Yellow Eyes. Respiratory Not Present- Bloody sputum, Chronic Cough, Difficulty Breathing, Snoring and Wheezing. Breast Not Present- Breast Mass, Breast Pain, Nipple Discharge and Skin Changes. Cardiovascular Not Present- Chest Pain, Difficulty Breathing Lying Down, Leg Cramps, Palpitations, Rapid Heart Rate, Shortness of Breath and Swelling of Extremities. Gastrointestinal Present- Hemorrhoids. Not Present- Abdominal Pain, Bloating, Bloody Stool, Change in Bowel Habits, Chronic diarrhea, Constipation, Difficulty Swallowing, Excessive gas, Gets full quickly at meals, Indigestion, Nausea, Rectal Pain and Vomiting. Male Genitourinary Not Present- Blood in Urine, Change in Urinary Stream, Frequency, Impotence, Nocturia, Painful Urination, Urgency and Urine Leakage. Musculoskeletal Not Present- Back Pain, Joint Pain, Joint Stiffness, Muscle Pain, Muscle Weakness and Swelling of Extremities. Neurological Not Present- Decreased Memory, Fainting, Headaches, Numbness, Seizures, Tingling, Tremor, Trouble walking and Weakness. Psychiatric Not Present- Anxiety, Bipolar, Change in Sleep Pattern, Depression, Fearful and Frequent crying. Endocrine Not Present- Cold Intolerance, Excessive Hunger, Hair Changes, Heat Intolerance, Hot flashes and Cristian Diabetes. Hematology Not Present- Easy Bruising, Excessive bleeding, Gland problems, HIV and Persistent Infections.  Vitals (Cristian Welch Welch; 11/21/2013 1:51 PM) 11/21/2013 1:49 PM Weight: 248 lb Height: 73in Body Surface Area: 2.41 m Body Mass Index: 32.72  kg/m Temp.: 98.78F(Oral)  Pulse: 89 (Regular)  P.OX: 97% (Room air) BP: 118/74 (Sitting, Left Arm, Standard)     Physical Exam Cristian Ruff MD; 9/51/8841 2:20 PM)  General Mental Status-Alert. General Appearance-Cooperative.  Chest and Lung Exam Chest and lung exam reveals -on auscultation, normal breath sounds, no adventitious sounds and normal vocal resonance.  Cardiovascular Cardiovascular examination reveals -normal heart sounds, regular  rate and rhythm with no murmurs.  Abdomen Palpation/Percussion Palpation and Percussion of the abdomen reveal - Soft and Non Tender.  Rectal Anorectal Exam External - localized tenderness and skin tag. Internal - normal internal exam and normal sphincter tone. Proctoscopic exam -Note:see procedure note.     Assessment & Plan Cristian Ruff MD; 3/88/8280 2:15 PM)  SKIN TAG OF ANUS (455.9  K64.4) Story: Pt very symptomatic from skin tag. He has tried medical therapy with no relief. Impression: On exam, I see no other cause for his pain. Will plan on surgical excision. Risks include bleeding, pain and recurrence.  Current Plans ANOSCOPY, DIAGNOSTIC [phone])

## 2013-11-28 ENCOUNTER — Encounter (HOSPITAL_BASED_OUTPATIENT_CLINIC_OR_DEPARTMENT_OTHER): Payer: Self-pay | Admitting: *Deleted

## 2013-11-28 NOTE — Progress Notes (Addendum)
NPO AFTER MN WITH EXCEPTION CLEAR LIQUIDS UNTIL 0830 (NO CREAM/ MILK PRODUCTS).  ARRIVE AT 1300. NEEDS ISTAT AND EKG. PT TO BRING NAME OF BP MED DOS (HE NEW IT WAS AN ACE INHIBITOR).

## 2013-11-28 NOTE — Progress Notes (Signed)
11/28/13 [place]  Have you ever been diagnosed with sleep apnea through a sleep study? No  Do you snore loudly (loud enough to be heard through closed doors)?  0  Do you often feel tired, fatigued, or sleepy during the daytime? 0  Has anyone observed you stop breathing during your sleep? 0  Do you have, or are you being treated for high blood pressure? 1  BMI more than 35 kg/m2? 0  Age over 55 years old? 1  Neck circumference greater than 40 cm/16 inches? 1  Gender: 1  Obstructive Sleep Apnea Score 4  Score 4 or greater  Results sent to PCP

## 2013-12-01 ENCOUNTER — Other Ambulatory Visit: Payer: Self-pay

## 2013-12-01 ENCOUNTER — Encounter (HOSPITAL_BASED_OUTPATIENT_CLINIC_OR_DEPARTMENT_OTHER): Payer: Self-pay

## 2013-12-01 ENCOUNTER — Ambulatory Visit (HOSPITAL_BASED_OUTPATIENT_CLINIC_OR_DEPARTMENT_OTHER): Payer: Medicaid Other | Admitting: Anesthesiology

## 2013-12-01 ENCOUNTER — Ambulatory Visit (HOSPITAL_BASED_OUTPATIENT_CLINIC_OR_DEPARTMENT_OTHER)
Admission: RE | Admit: 2013-12-01 | Discharge: 2013-12-01 | Disposition: A | Discharge status: Home / Self Care | Payer: Medicaid Other | Source: Ambulatory Visit | Attending: General Surgery | Admitting: General Surgery

## 2013-12-01 ENCOUNTER — Encounter (HOSPITAL_BASED_OUTPATIENT_CLINIC_OR_DEPARTMENT_OTHER): Payer: Medicaid Other | Admitting: Anesthesiology

## 2013-12-01 ENCOUNTER — Encounter (HOSPITAL_BASED_OUTPATIENT_CLINIC_OR_DEPARTMENT_OTHER)
Admission: RE | Disposition: A | Discharge status: Home / Self Care | Payer: Self-pay | Source: Ambulatory Visit | Attending: General Surgery

## 2013-12-01 DIAGNOSIS — E780 Pure hypercholesterolemia: Secondary | ICD-10-CM | POA: Insufficient documentation

## 2013-12-01 DIAGNOSIS — Z79899 Other long term (current) drug therapy: Secondary | ICD-10-CM | POA: Insufficient documentation

## 2013-12-01 DIAGNOSIS — E119 Type 2 diabetes mellitus without complications: Secondary | ICD-10-CM | POA: Insufficient documentation

## 2013-12-01 DIAGNOSIS — Z87891 Personal history of nicotine dependence: Secondary | ICD-10-CM | POA: Diagnosis not present

## 2013-12-01 DIAGNOSIS — K644 Residual hemorrhoidal skin tags: Secondary | ICD-10-CM | POA: Insufficient documentation

## 2013-12-01 DIAGNOSIS — Z881 Allergy status to other antibiotic agents status: Secondary | ICD-10-CM | POA: Insufficient documentation

## 2013-12-01 DIAGNOSIS — J45909 Unspecified asthma, uncomplicated: Secondary | ICD-10-CM | POA: Insufficient documentation

## 2013-12-01 HISTORY — DX: Personal history of Methicillin resistant Staphylococcus aureus infection: Z86.14

## 2013-12-01 HISTORY — DX: Type 2 diabetes mellitus without complications: E11.9

## 2013-12-01 HISTORY — DX: Malignant neoplasm of prostate: C61

## 2013-12-01 HISTORY — PX: EXCISION OF SKIN TAG: SHX6270

## 2013-12-01 HISTORY — DX: Other specified personal risk factors, not elsewhere classified: Z91.89

## 2013-12-01 HISTORY — DX: Exercise induced bronchospasm: J45.990

## 2013-12-01 HISTORY — DX: Residual hemorrhoidal skin tags: K64.4

## 2013-12-01 LAB — POCT I-STAT 4, (NA,K, GLUC, HGB,HCT)
GLUCOSE: 129 mg/dL — AB (ref 70–99)
HEMATOCRIT: 44 % (ref 39.0–52.0)
HEMOGLOBIN: 15 g/dL (ref 13.0–17.0)
POTASSIUM: 3.7 meq/L (ref 3.7–5.3)
Sodium: 138 mEq/L (ref 137–147)

## 2013-12-01 LAB — GLUCOSE, CAPILLARY: Glucose-Capillary: 114 mg/dL — ABNORMAL HIGH (ref 70–99)

## 2013-12-01 SURGERY — EXCISION, SKIN TAG
Anesthesia: Monitor Anesthesia Care | Site: Rectum

## 2013-12-01 MED ORDER — MEPERIDINE HCL 25 MG/ML IJ SOLN
6.2500 mg | INTRAMUSCULAR | Status: DC | PRN
  Filled 2013-12-01: qty 1

## 2013-12-01 MED ORDER — PROMETHAZINE HCL 25 MG/ML IJ SOLN
6.2500 mg | INTRAMUSCULAR | Status: DC | PRN
  Filled 2013-12-01: qty 1

## 2013-12-01 MED ORDER — OXYCODONE HCL 5 MG/5ML PO SOLN
5.0000 mg | Freq: Once | ORAL | Status: DC | PRN
  Filled 2013-12-01: qty 5

## 2013-12-01 MED ORDER — PROPOFOL 10 MG/ML IV EMUL
INTRAVENOUS | Status: DC | PRN
  Administered 2013-12-01: 160 ug/kg/min via INTRAVENOUS

## 2013-12-01 MED ORDER — MIDAZOLAM HCL 2 MG/2ML IJ SOLN
INTRAMUSCULAR | Status: AC
  Filled 2013-12-01: qty 2

## 2013-12-01 MED ORDER — ONDANSETRON HCL 4 MG/2ML IJ SOLN
INTRAMUSCULAR | Status: DC | PRN
  Administered 2013-12-01: 4 mg via INTRAVENOUS

## 2013-12-01 MED ORDER — DEXAMETHASONE SODIUM PHOSPHATE 4 MG/ML IJ SOLN
INTRAMUSCULAR | Status: DC | PRN
  Administered 2013-12-01: 10 mg via INTRAVENOUS

## 2013-12-01 MED ORDER — MIDAZOLAM HCL 5 MG/5ML IJ SOLN
INTRAMUSCULAR | Status: DC | PRN
  Administered 2013-12-01: 2 mg via INTRAVENOUS

## 2013-12-01 MED ORDER — LACTATED RINGERS IV SOLN
INTRAVENOUS | Status: DC
  Administered 2013-12-01: 13:00:00 via INTRAVENOUS
  Filled 2013-12-01: qty 1000

## 2013-12-01 MED ORDER — LIDOCAINE 5 % EX OINT
TOPICAL_OINTMENT | CUTANEOUS | Status: DC | PRN
  Administered 2013-12-01: 1

## 2013-12-01 MED ORDER — HYDROMORPHONE HCL 1 MG/ML IJ SOLN
0.2500 mg | INTRAMUSCULAR | Status: DC | PRN
  Filled 2013-12-01: qty 1

## 2013-12-01 MED ORDER — ACETAMINOPHEN 325 MG PO TABS
650.0000 mg | ORAL_TABLET | ORAL | Status: DC | PRN
  Filled 2013-12-01: qty 2

## 2013-12-01 MED ORDER — OXYCODONE HCL 5 MG PO TABS
5.0000 mg | ORAL_TABLET | Freq: Once | ORAL | Status: DC | PRN
  Filled 2013-12-01: qty 1

## 2013-12-01 MED ORDER — SODIUM CHLORIDE 0.9 % IV SOLN
250.0000 mL | INTRAVENOUS | Status: DC | PRN
  Filled 2013-12-01: qty 250

## 2013-12-01 MED ORDER — LIDOCAINE HCL (CARDIAC) 20 MG/ML IV SOLN
INTRAVENOUS | Status: DC | PRN
  Administered 2013-12-01: 50 mg via INTRAVENOUS

## 2013-12-01 MED ORDER — BUPIVACAINE-EPINEPHRINE 0.5% -1:200000 IJ SOLN
INTRAMUSCULAR | Status: DC | PRN
  Administered 2013-12-01: 20 mL

## 2013-12-01 MED ORDER — OXYCODONE HCL 5 MG PO TABS
5.0000 mg | ORAL_TABLET | ORAL | Status: DC | PRN
  Filled 2013-12-01: qty 2

## 2013-12-01 MED ORDER — FENTANYL CITRATE 0.05 MG/ML IJ SOLN
INTRAMUSCULAR | Status: DC | PRN
  Administered 2013-12-01: 50 ug via INTRAVENOUS

## 2013-12-01 MED ORDER — FENTANYL CITRATE 0.05 MG/ML IJ SOLN
INTRAMUSCULAR | Status: AC
  Filled 2013-12-01: qty 4

## 2013-12-01 MED ORDER — SODIUM CHLORIDE 0.9 % IJ SOLN
3.0000 mL | Freq: Two times a day (BID) | INTRAMUSCULAR | Status: DC
  Filled 2013-12-01: qty 3

## 2013-12-01 MED ORDER — SODIUM CHLORIDE 0.9 % IJ SOLN
3.0000 mL | INTRAMUSCULAR | Status: DC | PRN
Start: 2013-12-01 — End: 2013-12-01
  Filled 2013-12-01: qty 3

## 2013-12-01 MED ORDER — ACETAMINOPHEN 650 MG RE SUPP
650.0000 mg | RECTAL | Status: DC | PRN
  Filled 2013-12-01: qty 1

## 2013-12-01 MED ORDER — HYDROCODONE-ACETAMINOPHEN 5-325 MG PO TABS: 1.0000 | ORAL_TABLET | ORAL | Status: DC | PRN

## 2013-12-01 SURGICAL SUPPLY — 53 items
BENZOIN TINCTURE PRP APPL 2/3 (GAUZE/BANDAGES/DRESSINGS) ×3 IMPLANT
BLADE CLIPPER SURG (BLADE) IMPLANT
BLADE SURG 15 STRL LF DISP TIS (BLADE) ×1 IMPLANT
BLADE SURG 15 STRL SS (BLADE) ×2
BRIEF STRETCH FOR OB PAD LRG (UNDERPADS AND DIAPERS) ×3 IMPLANT
CHLORAPREP W/TINT 26ML (MISCELLANEOUS) IMPLANT
CLOSURE WOUND 1/2 X4 (GAUZE/BANDAGES/DRESSINGS)
COVER MAYO STAND STRL (DRAPES) ×3 IMPLANT
COVER TABLE BACK 60X90 (DRAPES) ×3 IMPLANT
DECANTER SPIKE VIAL GLASS SM (MISCELLANEOUS) IMPLANT
DERMABOND ADVANCED (GAUZE/BANDAGES/DRESSINGS)
DERMABOND ADVANCED .7 DNX12 (GAUZE/BANDAGES/DRESSINGS) IMPLANT
DRAPE LG THREE QUARTER DISP (DRAPES) IMPLANT
DRAPE PED LAPAROTOMY (DRAPES) ×3 IMPLANT
DRAPE UNDERBUTTOCKS STRL (DRAPE) IMPLANT
DRAPE UTILITY XL STRL (DRAPES) ×3 IMPLANT
DRSG PAD ABDOMINAL 8X10 ST (GAUZE/BANDAGES/DRESSINGS) ×3 IMPLANT
DRSG TEGADERM 4X4.75 (GAUZE/BANDAGES/DRESSINGS) IMPLANT
ELECT REM PT RETURN 9FT ADLT (ELECTROSURGICAL) ×3
ELECTRODE REM PT RTRN 9FT ADLT (ELECTROSURGICAL) ×1 IMPLANT
GLOVE BIO SURGEON STRL SZ 6.5 (GLOVE) ×6 IMPLANT
GLOVE BIO SURGEONS STRL SZ 6.5 (GLOVE) ×3
GLOVE BIOGEL PI IND STRL 7.0 (GLOVE) ×2 IMPLANT
GLOVE BIOGEL PI INDICATOR 7.0 (GLOVE) ×4
GLOVE INDICATOR 6.5 STRL GRN (GLOVE) ×6 IMPLANT
GLOVE INDICATOR 7.0 STRL GRN (GLOVE) IMPLANT
GOWN PREVENTION PLUS XLARGE (GOWN DISPOSABLE) ×3 IMPLANT
GOWN STRL REUS W/TWL 2XL LVL3 (GOWN DISPOSABLE) ×3 IMPLANT
LEGGING LITHOTOMY PAIR STRL (DRAPES) IMPLANT
NEEDLE HYPO 25X1 1.5 SAFETY (NEEDLE) ×6 IMPLANT
NS IRRIG 1000ML POUR BTL (IV SOLUTION) IMPLANT
PENCIL BUTTON HOLSTER BLD 10FT (ELECTRODE) ×3 IMPLANT
SPONGE GAUZE 4X4 12PLY (GAUZE/BANDAGES/DRESSINGS) ×3 IMPLANT
SPONGE GAUZE 4X4 12PLY STER LF (GAUZE/BANDAGES/DRESSINGS) ×3 IMPLANT
STRIP CLOSURE SKIN 1/2X4 (GAUZE/BANDAGES/DRESSINGS) IMPLANT
SUT CHROMIC 3 0 SH 27 (SUTURE) ×6 IMPLANT
SUT ETHILON 2 0 FS 18 (SUTURE) IMPLANT
SUT ETHILON 4 0 PS 2 18 (SUTURE) IMPLANT
SUT MNCRL AB 4-0 PS2 18 (SUTURE) ×3 IMPLANT
SUT SILK 2 0 SH (SUTURE) IMPLANT
SUT VIC AB 2-0 SH 27 (SUTURE)
SUT VIC AB 2-0 SH 27XBRD (SUTURE) IMPLANT
SUT VICRYL 4-0 PS2 18IN ABS (SUTURE) IMPLANT
SYR CONTROL 10ML LL (SYRINGE) ×6 IMPLANT
TOWEL OR 17X24 6PK STRL BLUE (TOWEL DISPOSABLE) ×6 IMPLANT
TRAY DSU PREP LF (CUSTOM PROCEDURE TRAY) ×3 IMPLANT
TUBE ANAEROBIC SPECIMEN COL (MISCELLANEOUS) IMPLANT
TUBE CONNECTING 12'X1/4 (SUCTIONS) ×1
TUBE CONNECTING 12X1/4 (SUCTIONS) ×2 IMPLANT
UNDERPAD 30X30 INCONTINENT (UNDERPADS AND DIAPERS) ×3 IMPLANT
VACUUM HOSE 7/8X10 W/ WAND (MISCELLANEOUS) IMPLANT
WATER STERILE IRR 1000ML POUR (IV SOLUTION) IMPLANT
YANKAUER SUCT BULB TIP NO VENT (SUCTIONS) ×3 IMPLANT

## 2013-12-01 NOTE — Transfer of Care (Signed)
Immediate Anesthesia Transfer of Care Note  Patient: Cristian Welch  Procedure(s) Performed: Procedure(s): EXCISION OF ANAL SKIN TAG (N/A)  Patient Location: PACU  Anesthesia Type:MAC  Level of Consciousness: awake, alert  and oriented  Airway & Oxygen Therapy: Patient Spontanous Breathing and Patient connected to nasal cannula oxygen  Post-op Assessment: Report given to PACU RN and Post -op Vital signs reviewed and stable  Post vital signs: Reviewed and stable  Complications: No apparent anesthesia complications

## 2013-12-01 NOTE — Anesthesia Procedure Notes (Signed)
Procedure Name: MAC Date/Time: 12/01/2013 2:35 PM Performed by: Wanita Chamberlain Pre-anesthesia Checklist: Patient identified, Timeout performed, Emergency Drugs available, Patient being monitored and Suction available Patient Re-evaluated:Patient Re-evaluated prior to inductionOxygen Delivery Method: Nasal cannula Intubation Type: IV induction Placement Confirmation: positive ETCO2

## 2013-12-01 NOTE — Op Note (Signed)
12/01/2013  3:01 PM  PATIENT:  Cristian Welch  53 y.o. male  Patient Care Team: Philis Fendt, MD as PCP - General (Internal Medicine)  PRE-OPERATIVE DIAGNOSIS:  anal skin tag  POST-OPERATIVE DIAGNOSIS:  anal skin tag  PROCEDURE:  EXCISION OF ANAL SKIN TAG  SURGEON:  Surgeon(s): Leighton Ruff, MD  ASSISTANT: none   ANESTHESIA:   local and MAC  SPECIMEN:  Source of Specimen:  anal skin tag  DISPOSITION OF SPECIMEN:  PATHOLOGY  COUNTS:  YES  PLAN OF CARE: Discharge to home after PACU  PATIENT DISPOSITION:  PACU - hemodynamically stable.  INDICATION: This is a 53 y.o. M with a symptomatic anal skin tag despite dietary changes and fiber supplementation.     OR FINDINGS: anal skin tag  DESCRIPTION: the patient was identified in the preoperative holding area and taken to the OR where they were laid on the operating room table.  MAC anesthesia was induced without difficulty. The patient was then positioned in prone jackknife position with buttocks gently taped apart.  The patient was then prepped and draped in usual sterile fashion.  SCDs were noted to be in place prior to the initiation of anesthesia. A surgical timeout was performed indicating the correct patient, procedure, positioning and need for preoperative antibiotics.  A rectal block was performed using Marciane with epinephrine.    I began with a digital rectal exam.  There were no palpable masses.  I then placed a Hill-Ferguson anoscope into the anal canal and evaluated this completely.  There were no other signs of pathology internally.  Grade 1 internal hemorrhoids were noted.  There was slight sphincter hypertension noted.  The skin tag located in the L lateral position was excised with scissors.  The skin was closed with a 3-0 Chromic suture.  Hemostasis was good.  The area was covered with a dressing.  The patient was sent to the PACU in stable condition.  All counts were correct per OR staff.

## 2013-12-01 NOTE — H&P (Signed)
Cristian Welch 11/21/2013 1:49 PM Location: [facility] Patient #: [id number] DOB: [birth year] Single / Language: [name] / Race: Black or African American Male  History of Present Illness Leighton Ruff MD; 06/20/621 2:18 PM) Patient words: hemorrhoids.  The patient is a 53 year old male who presents with anal pain. The last clinic visit was 6 month(s) ago. Management changes made at the last visit include adding fiber. Symptoms include anal pain and rectal bleeding. The patient describes the pain as sharp, aching and throbbing. The episodes occur 1 times a day. The patient describes this as moderate in severity and worsening. Past evaluation has included anoscopy (6 months ago by Dr Barry Dienes) and colonoscopy (5 yrs ago and normal per pt).   Other Problems Alexander Bergeron [place], [place]; 11/21/2013 1:49 PM) Cancer Diabetes Mellitus Hemorrhoids High blood pressure Hypercholesterolemia  Past Surgical History Alexander Bergeron [place], [place]; 11/21/2013 1:49 PM) Prostate Surgery - Removal  Allergies (Dahionnarah [place], RMA; 11/21/2013 1:51 PM) Cephalexin *CEPHALOSPORINS*  Medication History (Dahionnarah [place], RMA; 11/21/2013 1:54 PM) Acetaminophen (500MG  Capsule, Oral) Active. Albuterol Sulfate HFA (108 (90 Base)MCG/ACT Aerosol Soln, Inhalation) Active. ZyrTEC Allergy (10MG  Capsule, Oral) Active. Lovastatin (20MG  Tablet, Oral) Active. MetFORMIN HCl (1000MG  Tablet, Oral) Active. Lisinopril (5MG  Tablet, Oral) Active. TiZANidine HCl (4MG  Capsule, Oral) Active. Diclofenac Sodium (75MG  Tablet DR, Oral) Active. Medications Reconciled  Social History Alexander Bergeron [place], [place]; 11/21/2013 1:49 PM) Alcohol use Remotely quit alcohol use. Illicit drug use Remotely quit drug use.  Family History Alexander Bergeron [place], [place]; 11/21/2013 1:49 PM) Alcohol Abuse Father, Mother.  Review of Systems (Power RMA; 11/21/2013 1:49 PM) General Not Present-  Appetite Loss, Chills, Fatigue, Fever, Night Sweats, Weight Gain and Weight Loss. Skin Not Present- Change in Wart/Mole, Dryness, Hives, Jaundice, New Lesions, Non-Healing Wounds, Rash and Ulcer. HEENT Not Present- Earache, Hearing Loss, Hoarseness, Nose Bleed, Oral Ulcers, Ringing in the Ears, Seasonal Allergies, Sinus Pain, Sore Throat, Visual Disturbances, Wears glasses/contact lenses and Yellow Eyes. Respiratory Not Present- Bloody sputum, Chronic Cough, Difficulty Breathing, Snoring and Wheezing. Breast Not Present- Breast Mass, Breast Pain, Nipple Discharge and Skin Changes. Cardiovascular Not Present- Chest Pain, Difficulty Breathing Lying Down, Leg Cramps, Palpitations, Rapid Heart Rate, Shortness of Breath and Swelling of Extremities. Gastrointestinal Present- Hemorrhoids. Not Present- Abdominal Pain, Bloating, Bloody Stool, Change in Bowel Habits, Chronic diarrhea, Constipation, Difficulty Swallowing, Excessive gas, Gets full quickly at meals, Indigestion, Nausea, Rectal Pain and Vomiting. Male Genitourinary Not Present- Blood in Urine, Change in Urinary Stream, Frequency, Impotence, Nocturia, Painful Urination, Urgency and Urine Leakage. Musculoskeletal Not Present- Back Pain, Joint Pain, Joint Stiffness, Muscle Pain, Muscle Weakness and Swelling of Extremities. Neurological Not Present- Decreased Memory, Fainting, Headaches, Numbness, Seizures, Tingling, Tremor, Trouble walking and Weakness. Psychiatric Not Present- Anxiety, Bipolar, Change in Sleep Pattern, Depression, Fearful and Frequent crying. Endocrine Not Present- Cold Intolerance, Excessive Hunger, Hair Changes, Heat Intolerance, Hot flashes and New Diabetes. Hematology Not Present- Easy Bruising, Excessive bleeding, Gland problems, HIV and Persistent Infections.   BP 146/97  Pulse 90  Temp(Src) 98.2 F (36.8 C) (Oral)  Resp 16  Wt 247 lb 8 oz (112.265 kg)  SpO2 97%  Physical Exam Leighton Ruff MD; 7/62/8315 2:20  PM) General Mental Status-Alert. General Appearance-Cooperative.  Chest and Lung Exam Chest and lung exam reveals -on auscultation, normal breath sounds, no adventitious sounds and normal vocal resonance.  Cardiovascular Cardiovascular examination reveals -normal heart sounds, regular rate and rhythm with no murmurs.  Abdomen Palpation/Percussion Palpation and Percussion of the abdomen reveal - Soft and  Non Tender.  Rectal Anorectal Exam External - localized tenderness and skin tag. Internal - normal internal exam and normal sphincter tone. Proctoscopic exam -Note: see procedure note.     Assessment & Plan Leighton Ruff MD; 09/18/2033 2:15 PM) SKIN TAG OF ANUS (455.9  K64.4) Story: Pt very symptomatic from skin tag. He has tried medical therapy with no relief. Impression: On exam, I see no other cause for his pain. Will plan on surgical excision. Risks include bleeding, pain and recurrence. Current Plans  ANOSCOPY, DIAGNOSTIC ([id number])   Signed by Leighton Ruff, MD (6/38/4536 2:23 PM)

## 2013-12-01 NOTE — Discharge Instructions (Addendum)
ANORECTAL SURGERY: POST OP INSTRUCTIONS °1. Take your usually prescribed home medications unless otherwise directed. °2. DIET: During the first few hours after surgery sip on some liquids until you are able to urinate.  It is normal to not urinate for several hours after this surgery.  If you feel uncomfortable, please contact the office for instructions.  After you are able to urinate,you may eat, if you feel like it.  Follow a light bland diet the first 24 hours after arrival home, such as soup, liquids, crackers, etc.  Be sure to include lots of fluids daily (6-8 glasses).  Avoid fast food or heavy meals, as your are more likely to get nauseated.  Eat a low fat diet the next few days after surgery.  Limit caffeine intake to 1-2 servings a day. °3. PAIN CONTROL: °a. Pain is best controlled by a usual combination of several different methods TOGETHER: °i. Muscle relaxation: Soak in a warm bath (or Sitz bath) three times a day and after bowel movements.  Continue to do this until all pain is resolved. °ii. Over the counter pain medication °iii. Prescription pain medication °b. Most patients will experience some swelling and discomfort in the anus/rectal area and incisions.  Heat such as warm towels, sitz baths, warm baths, etc to help relax tight/sore spots and speed recovery.  Some people prefer to use ice, especially in the first couple days after surgery, as it may decrease the pain and swelling, or alternate between ice & heat.  Experiment to what works for you.  Swelling and bruising can take several weeks to resolve.  Pain can take even longer to completely resolve. °c. It is helpful to take an over-the-counter pain medication regularly for the first few weeks.  Choose one of the following that works best for you: °i. Naproxen (Aleve, etc)  Two 220mg tabs twice a day °ii. Ibuprofen (Advil, etc) Three 200mg tabs four times a day (every meal & bedtime) °d. A  prescription for pain medication (such as percocet,  oxycodone, hydrocodone, etc) should be given to you upon discharge.  Take your pain medication as prescribed.  °i. If you are having problems/concerns with the prescription medicine (does not control pain, nausea, vomiting, rash, itching, etc), please call us [phone] to see if we need to switch you to a different pain medicine that will work better for you and/or control your side effect better. °ii. If you need a refill on your pain medication, please contact your pharmacy.  They will contact our office to request authorization. Prescriptions will not be filled after 5 pm or on week-ends. °4. KEEP YOUR BOWELS REGULAR and AVOID CONSTIPATION °a. The goal is one to two soft bowel movements a day.  You should at least have a bowel movement every other day. °b. Avoid getting constipated.  Between the surgery and the pain medications, it is common to experience some constipation. This can be very painful after rectal surgery.  Increasing fluid intake and taking a fiber supplement (such as Metamucil, Citrucel, FiberCon, etc) 1-2 times a day regularly will usually help prevent this problem from occurring.  A stool softener like colace is also recommended.  This can be purchased over the counter at your pharmacy.  You can take it up to 3 times a day.  If you do not have a bowel movement after 24 hrs since your surgery, take one does of milk of magnesia.  If you still haven't had a bowel movement 8-12 hours after   that dose, take another dose.  If you don't have a bowel movement 48 hrs after surgery, purchase a Fleets enema from the drug store and administer gently per package instructions.  If you still are having trouble with your bowel movements after that, please call the office for further instructions. °c. If you develop diarrhea or have many loose bowel movements, simplify your diet to bland foods & liquids for a few days.  Stop any stool softeners and decrease your fiber supplement.  Switching to mild  anti-diarrheal medications (Kayopectate, Pepto Bismol) can help.  If this worsens or does not improve, please call us. ° °5. Wound Care °a. Remove your bandages before your first bowel movement or 8 hours after surgery.     °b. Remove any wound packing material at this tim,e as well.  You do not need to repack the wound unless instructed otherwise.  Wear an absorbent pad or soft cotton gauze in your underwear to catch any drainage and help keep the area clean. You should change this every 2-3 hours while awake. °c. Keep the area clean and dry.  Bathe / shower every day, especially after bowel movements.  Keep the area clean by showering / bathing over the incision / wound.   It is okay to soak an open wound to help wash it.  Wet wipes or showers / gentle washing after bowel movements is often less traumatic than regular toilet paper. °d. You may have some styrofoam-like soft packing in the rectum which will come out with the first bowel movement.  °e. You will often notice bleeding with bowel movements.  This should slow down by the end of the first week of surgery °f. Expect some drainage.  This should slow down, too, by the end of the first week of surgery.  Wear an absorbent pad or soft cotton gauze in your underwear until the drainage stops. °g. Do Not sit on a rubber or pillow ring.  This can make you symptoms worse.  You may sit on a soft pillow if needed.  °6. ACTIVITIES as tolerated:   °a. You may resume regular (light) daily activities beginning the next day--such as daily self-care, walking, climbing stairs--gradually increasing activities as tolerated.  If you can walk 30 minutes without difficulty, it is safe to try more intense activity such as jogging, treadmill, bicycling, low-impact aerobics, swimming, etc. °b. Save the most intensive and strenuous activity for last such as sit-ups, heavy lifting, contact sports, etc  Refrain from any heavy lifting or straining until you are off narcotics for pain  control.   °c. You may drive when you are no longer taking prescription pain medication, you can comfortably sit for long periods of time, and you can safely maneuver your car and apply brakes. °d. You may have sexual intercourse when it is comfortable.  °7. FOLLOW UP in our office °a. Please call [facility] at [phone] to set up an appointment to see your surgeon in the office for a follow-up appointment approximately 3-4 weeks after your surgery. °b. Make sure that you call for this appointment the day you arrive home to insure a convenient appointment time. °10. IF YOU HAVE DISABILITY OR FAMILY LEAVE FORMS, BRING THEM TO THE OFFICE FOR PROCESSING.  DO NOT GIVE THEM TO YOUR DOCTOR. ° ° ° ° °WHEN TO CALL US [phone]: °1. Poor pain control °2. Reactions / problems with new medications (rash/itching, nausea, etc)  °3. Fever over 101.5 F (38.5 C) °4.   Inability to urinate °5. Nausea and/or vomiting °6. Worsening swelling or bruising °7. Continued bleeding from incision. °8. Increased pain, redness, or drainage from the incision ° °The clinic staff is available to answer your questions during regular business hours (8:30am-5pm).  Please don’t hesitate to call and ask to speak to one of our nurses for clinical concerns.   A surgeon from [facility] is always on call at the hospitals °  °If you have a medical emergency, go to the nearest emergency room or call 911. °  ° °[facility] °[address], [address], [place], [place]  [address] ? °MAIN: [phone] ? TOLL FREE: [phone] ? °FAX [phone] °[email or web address] ° ° ° ° ° °Post Anesthesia Home Care Instructions ° °Activity: °Get plenty of rest for the remainder of the day. A responsible adult should stay with you for 24 hours following the procedure.  °For the next 24 hours, DO NOT: °-Drive a car °-Operate machinery °-Drink alcoholic beverages °-Take any medication unless instructed by your  physician °-Make any legal decisions or sign important papers. ° °Meals: °Start with liquid foods such as gelatin or soup. Progress to regular foods as tolerated. Avoid greasy, spicy, heavy foods. If nausea and/or vomiting occur, drink only clear liquids until the nausea and/or vomiting subsides. Call your physician if vomiting continues. ° °Special Instructions/Symptoms: °Your throat may feel dry or sore from the anesthesia or the breathing tube placed in your throat during surgery. If this causes discomfort, gargle with warm salt water. The discomfort should disappear within 24 hours. ° °

## 2013-12-01 NOTE — Anesthesia Postprocedure Evaluation (Signed)
  Anesthesia Post-op Note  Patient: Cristian Welch  Procedure(s) Performed: Procedure(s) (LRB): EXCISION OF ANAL SKIN TAG (N/A)  Patient Location: PACU  Anesthesia Type: MAC  Level of Consciousness: awake and alert   Airway and Oxygen Therapy: Patient Spontanous Breathing  Post-op Pain: mild  Post-op Assessment: Post-op Vital signs reviewed, Patient's Cardiovascular Status Stable, Respiratory Function Stable, Patent Airway and No signs of Nausea or vomiting  Last Vitals:  Filed Vitals:   12/01/13 1515  BP: 140/86  Pulse: 89  Temp:   Resp: 19    Post-op Vital Signs: stable   Complications: No apparent anesthesia complications

## 2013-12-01 NOTE — Anesthesia Preprocedure Evaluation (Addendum)
Anesthesia Evaluation  Patient identified by MRN, date of birth, ID band Patient awake    Reviewed: Allergy & Precautions, H&P , NPO status , Patient's Chart, lab work & pertinent test results  Airway Mallampati: II TM Distance: >3 FB Neck ROM: Full    Dental no notable dental hx. (+) Partial Upper, Dental Advisory Given   Pulmonary shortness of breath and with exertion, asthma , former smoker,  breath sounds clear to auscultation  Pulmonary exam normal       Cardiovascular negative cardio ROS  Rhythm:Regular Rate:Normal     Neuro/Psych negative neurological ROS  negative psych ROS   GI/Hepatic Neg liver ROS, GERD-  ,  Endo/Other  diabetes, Poorly Controlled, Type 2, Oral Hypoglycemic Agents  Renal/GU negative Renal ROS     Musculoskeletal  (+) Arthritis -,   Abdominal   Peds  Hematology negative hematology ROS (+)   Anesthesia Other Findings   Reproductive/Obstetrics                          Anesthesia Physical  Anesthesia Plan  ASA: III  Anesthesia Plan: General and MAC   Post-op Pain Management:    Induction: Intravenous  Airway Management Planned:   Additional Equipment:   Intra-op Plan:   Post-operative Plan:   Informed Consent: I have reviewed the patients History and Physical, chart, labs and discussed the procedure including the risks, benefits and alternatives for the proposed anesthesia with the patient or authorized representative who has indicated his/her understanding and acceptance.   Dental advisory given  Plan Discussed with: CRNA  Anesthesia Plan Comments:         Anesthesia Quick Evaluation

## 2013-12-02 ENCOUNTER — Encounter (HOSPITAL_BASED_OUTPATIENT_CLINIC_OR_DEPARTMENT_OTHER): Payer: Self-pay | Admitting: General Surgery

## 2015-02-06 ENCOUNTER — Encounter (HOSPITAL_COMMUNITY): Payer: Self-pay | Admitting: Emergency Medicine

## 2015-02-06 ENCOUNTER — Emergency Department (HOSPITAL_COMMUNITY)
Admission: EM | Admit: 2015-02-06 | Discharge: 2015-02-06 | Disposition: A | Discharge status: Home / Self Care | Payer: Medicaid Other | Attending: Emergency Medicine | Admitting: Emergency Medicine

## 2015-02-06 DIAGNOSIS — J45909 Unspecified asthma, uncomplicated: Secondary | ICD-10-CM | POA: Diagnosis not present

## 2015-02-06 DIAGNOSIS — Z8546 Personal history of malignant neoplasm of prostate: Secondary | ICD-10-CM | POA: Diagnosis not present

## 2015-02-06 DIAGNOSIS — Z87891 Personal history of nicotine dependence: Secondary | ICD-10-CM | POA: Insufficient documentation

## 2015-02-06 DIAGNOSIS — H409 Unspecified glaucoma: Secondary | ICD-10-CM | POA: Diagnosis not present

## 2015-02-06 DIAGNOSIS — M1990 Unspecified osteoarthritis, unspecified site: Secondary | ICD-10-CM | POA: Diagnosis not present

## 2015-02-06 DIAGNOSIS — Z8614 Personal history of Methicillin resistant Staphylococcus aureus infection: Secondary | ICD-10-CM | POA: Insufficient documentation

## 2015-02-06 DIAGNOSIS — E119 Type 2 diabetes mellitus without complications: Secondary | ICD-10-CM | POA: Insufficient documentation

## 2015-02-06 DIAGNOSIS — Z79899 Other long term (current) drug therapy: Secondary | ICD-10-CM | POA: Diagnosis not present

## 2015-02-06 DIAGNOSIS — Z8719 Personal history of other diseases of the digestive system: Secondary | ICD-10-CM | POA: Diagnosis not present

## 2015-02-06 DIAGNOSIS — N4889 Other specified disorders of penis: Secondary | ICD-10-CM | POA: Diagnosis not present

## 2015-02-06 DIAGNOSIS — N489 Disorder of penis, unspecified: Secondary | ICD-10-CM

## 2015-02-06 DIAGNOSIS — R1030 Lower abdominal pain, unspecified: Secondary | ICD-10-CM | POA: Diagnosis present

## 2015-02-06 LAB — RAPID HIV SCREEN (HIV 1/2 AB+AG)
HIV 1/2 ANTIBODIES: NONREACTIVE
HIV-1 P24 ANTIGEN - HIV24: NONREACTIVE

## 2015-02-06 MED ORDER — ACYCLOVIR 400 MG PO TABS: 400.0000 mg | ORAL_TABLET | Freq: Four times a day (QID) | ORAL | Status: DC

## 2015-02-06 NOTE — ED Provider Notes (Signed)
CSN: [id number]     Arrival date & time 02/06/15  1012 History   First MD Initiated Contact with Patient 02/06/15 1016     Chief Complaint  Patient presents with  . Groin Pain  . Insect Bite     (Consider location/radiation/quality/duration/timing/severity/associated sxs/prior Treatment) Patient is a 54 y.o. male presenting with groin pain. The history is provided by the patient.  Groin Pain This is a new problem. The current episode started 2 days ago. The problem occurs constantly. The problem has been gradually worsening. Pertinent negatives include no chest pain, no abdominal pain, no headaches and no shortness of breath. Nothing aggravates the symptoms. Nothing relieves the symptoms. He has tried nothing for the symptoms. The treatment provided no relief.    54 yo M with a chief complaints of a lesion to his penis. Patient noticed this a couple days ago. Feels that it's rubbing on his walking. Thinks that a bug bit him there. States he's had a STD but that was in the remote past. Denies any recent sexual contact.  Past Medical History  Diagnosis Date  . Glaucoma   . GERD (gastroesophageal reflux disease)   . Allergic rhinitis   . Arthritis   . History of MRSA infection     05/ 2014--  POSITIVE MRSA CULTURE SCROTAL ABSCESS  . Type 2 diabetes mellitus ([place])   . Exercise-induced asthma   . Skin tag of anus   . Prostate cancer [facility])     s/p  prostatectomy 08/ 2014  . At risk for sleep apnea     STOP-BANG= 4    SENT TO PCP 11-28-2013   Past Surgical History  Procedure Laterality Date  . Foot surgery Left 1994 (approx)  . Transthoracic echocardiogram  06-06-2009    NORMAL LVF AND LVSF/ EF 55-60%  . Irrigation and debridement abscess Right 07/07/2012    Procedure: IRRIGATION AND DEBRIDEMENT ABSCESS;  Surgeon: Molli Hazard, MD;  Location: [facility];  Service: Urology;  Laterality: Right;  and scrotal abcesses  . Circumcision N/A 08/04/2012    Procedure:  CIRCUMCISION ADULT;  Surgeon: Molli Hazard, MD;  Location: [facility];  Service: Urology;  Laterality: N/A;  90 MIN ALSO PROSTATE NERVE BLOCK   . Prostate biopsy N/A 08/04/2012    Procedure: BIOPSY TRANSRECTAL ULTRASONIC PROSTATE (TUBP);  Surgeon: Molli Hazard, MD;  Location: [facility];  Service: Urology;  Laterality: N/A;  . Robot assisted laparoscopic radical prostatectomy N/A 10/06/2012    Procedure: ROBOTIC ASSISTED LAPAROSCOPIC RADICAL PROSTATECTOMY LEVEL 2;  Surgeon: Molli Hazard, MD;  Location: [facility];  Service: Urology;  Laterality: N/A;  . Lymphadenectomy Bilateral 10/06/2012    Procedure: WITH BILATERAL PELVIC LYMPH NODE DISSECTION ;  Surgeon: Molli Hazard, MD;  Location: [facility];  Service: Urology;  Laterality: Bilateral;  . Inguinal hernia repair Right 2000 (approx)  . Excision of skin tag N/A 12/01/2013    Procedure: EXCISION OF ANAL SKIN TAG;  Surgeon: Leighton Ruff, MD;  Location: [facility];  Service: General;  Laterality: N/A;   History reviewed. No pertinent family history. Social History  Substance Use Topics  . Smoking status: Former Smoker -- 0.25 packs/day for 20 years    Types: Cigarettes    Quit date: 02/24/2002  . Smokeless tobacco: Never Used  . Alcohol Use: No    Review of Systems  Constitutional: Negative for fever and chills.  HENT: Negative for congestion and facial swelling.  Eyes: Negative for discharge and visual disturbance.  Respiratory: Negative for shortness of breath.   Cardiovascular: Negative for chest pain and palpitations.  Gastrointestinal: Negative for vomiting, abdominal pain and diarrhea.  Genitourinary: Positive for penile pain.  Musculoskeletal: Negative for myalgias and arthralgias.  Skin: Negative for color change and rash.  Neurological: Negative for tremors, syncope and headaches.  Psychiatric/Behavioral: Negative for confusion and dysphoric mood.       Allergies  Cephalexin  Home Medications   Prior to Admission medications   Medication Sig Start Date End Date Taking? Authorizing Provider  acetaminophen (TYLENOL) 500 MG tablet Take 1,000 mg by mouth every 6 (six) hours as needed for mild pain or moderate pain.    Yes Historical Provider, MD  albuterol (PROVENTIL HFA;VENTOLIN HFA) 108 (90 BASE) MCG/ACT inhaler Inhale 2 puffs into the lungs every 6 (six) hours as needed for wheezing.   Yes Historical Provider, MD  cetirizine (ZYRTEC) 10 MG tablet Take 10 mg by mouth daily.   Yes Historical Provider, MD  diclofenac (VOLTAREN) 75 MG EC tablet Take 75 mg by mouth 2 (two) times daily.   Yes Historical Provider, MD  DORZOLAMIDE HCL OP Apply 1 drop to eye 3 (three) times daily.   Yes Historical Provider, MD  glimepiride (AMARYL) 4 MG tablet Take 4 mg by mouth 2 (two) times daily.   Yes Historical Provider, MD  lisinopril (PRINIVIL,ZESTRIL) 5 MG tablet Take 5 mg by mouth daily.   Yes Historical Provider, MD  lovastatin (MEVACOR) 20 MG tablet Take 20 mg by mouth every morning.   Yes Historical Provider, MD  metFORMIN (GLUMETZA) 1000 MG (MOD) 24 hr tablet Take 1,000 mg by mouth 2 (two) times daily with a meal.   Yes Historical Provider, MD  travoprost, benzalkonium, (TRAVATAN) 0.004 % ophthalmic solution Place 1 drop into both eyes at bedtime.   Yes Historical Provider, MD  acyclovir (ZOVIRAX) 400 MG tablet Take 1 tablet (400 mg total) by mouth 4 (four) times daily. 02/06/15   Deno Etienne, DO  HYDROcodone-acetaminophen (NORCO/VICODIN) 5-325 MG per tablet Take 1-2 tablets by mouth every 4 (four) hours as needed. Patient not taking: Reported on [id number]   Leighton Ruff, MD   BP 111/79 mmHg  Pulse 80  Temp(Src) 97.8 F (36.6 C) (Oral)  Resp 16  SpO2 100% Physical Exam  Constitutional: He is oriented to person, place, and time. He appears well-developed and well-nourished.  HENT:  Head: Normocephalic and atraumatic.  Eyes: EOM are  normal. Pupils are equal, round, and reactive to light.  Neck: Normal range of motion. Neck supple. No JVD present.  Cardiovascular: Normal rate and regular rhythm.  Exam reveals no gallop and no friction rub.   No murmur heard. Pulmonary/Chest: No respiratory distress. He has no wheezes.  Abdominal: He exhibits no distension. There is no rebound and no guarding.  Genitourinary:    Circumcised.  Musculoskeletal: Normal range of motion.  Neurological: He is alert and oriented to person, place, and time.  Skin: No rash noted. No pallor.  Psychiatric: He has a normal mood and affect. His behavior is normal.  Nursing note and vitals reviewed.   ED Course  Procedures (including critical care time) Labs Review Labs Reviewed  HERPES SIMPLEX VIRUS CULTURE  RAPID HIV SCREEN (HIV 1/2 AB+AG)  RPR  GC/CHLAMYDIA PROBE AMP ([place]) NOT AT [facility]    Imaging Review No results found. I have personally reviewed and evaluated these images and lab results as part of my  medical decision-making.   EKG Interpretation None      MDM   Final diagnoses:  Penile lesion    54 yo M with a chief complaint of a lesion to his penis. Consistent with a herpes lesion. Herpes culture sent as well as GC RPR HIV. Start on acyclovir.   I have discussed the diagnosis/risks/treatment options with the patient and believe the pt to be eligible for discharge home to follow-up with PCP. We also discussed returning to the ED immediately if new or worsening sx occur. We discussed the sx which are most concerning (e.g., sudden worsening pain, fever) that necessitate immediate return. Medications administered to the patient during their visit and any new prescriptions provided to the patient are listed below.  Medications given during this visit Medications - No data to display  Discharge Medication List as of 02/06/2015 10:52 AM    START taking these medications   Details  acyclovir (ZOVIRAX) 400 MG tablet  Take 1 tablet (400 mg total) by mouth 4 (four) times daily., Starting 02/06/2015, Until Discontinued, Print        The patient appears reasonably screen and/or stabilized for discharge and I doubt any other medical condition or other [facility] requiring further screening, evaluation, or treatment in the ED at this time prior to discharge.      Deno Etienne, DO 02/06/15 [phone]

## 2015-02-06 NOTE — ED Notes (Signed)
Pt escorted to discharge window. Pt verbalized understanding discharge instructions. In no acute distress.  

## 2015-02-06 NOTE — ED Notes (Signed)
Pt says he was doing yard work and thinks he was bitten by insect on left groin. Reports irritation on penis. No other c/c.

## 2015-02-07 LAB — RPR: RPR Ser Ql: NONREACTIVE

## 2015-02-07 LAB — GC/CHLAMYDIA PROBE AMP (~~LOC~~) NOT AT ARMC
CHLAMYDIA, DNA PROBE: NEGATIVE
Neisseria Gonorrhea: NEGATIVE

## 2015-02-08 LAB — HERPES SIMPLEX VIRUS CULTURE
Culture: NOT DETECTED
SPECIAL REQUESTS: NORMAL

## 2016-12-15 ENCOUNTER — Other Ambulatory Visit: Payer: Self-pay

## 2016-12-15 ENCOUNTER — Emergency Department (HOSPITAL_COMMUNITY)
Admission: EM | Admit: 2016-12-15 | Discharge: 2016-12-15 | Disposition: A | Discharge status: Home / Self Care | Payer: Medicaid Other | Attending: Emergency Medicine | Admitting: Emergency Medicine

## 2016-12-15 ENCOUNTER — Encounter (HOSPITAL_COMMUNITY): Payer: Self-pay

## 2016-12-15 DIAGNOSIS — I959 Hypotension, unspecified: Secondary | ICD-10-CM | POA: Insufficient documentation

## 2016-12-15 DIAGNOSIS — Z79899 Other long term (current) drug therapy: Secondary | ICD-10-CM | POA: Insufficient documentation

## 2016-12-15 DIAGNOSIS — R42 Dizziness and giddiness: Secondary | ICD-10-CM | POA: Diagnosis present

## 2016-12-15 DIAGNOSIS — Z87891 Personal history of nicotine dependence: Secondary | ICD-10-CM | POA: Insufficient documentation

## 2016-12-15 DIAGNOSIS — E11649 Type 2 diabetes mellitus with hypoglycemia without coma: Secondary | ICD-10-CM | POA: Insufficient documentation

## 2016-12-15 DIAGNOSIS — E162 Hypoglycemia, unspecified: Secondary | ICD-10-CM

## 2016-12-15 DIAGNOSIS — Z8546 Personal history of malignant neoplasm of prostate: Secondary | ICD-10-CM | POA: Diagnosis not present

## 2016-12-15 LAB — BASIC METABOLIC PANEL
ANION GAP: 7 (ref 5–15)
BUN: 23 mg/dL — ABNORMAL HIGH (ref 6–20)
CALCIUM: 9.2 mg/dL (ref 8.9–10.3)
CHLORIDE: 106 mmol/L (ref 101–111)
CO2: 24 mmol/L (ref 22–32)
Creatinine, Ser: 1.26 mg/dL — ABNORMAL HIGH (ref 0.61–1.24)
GFR calc non Af Amer: >60 mL/min (ref >60)
Glucose, Bld: 52 mg/dL — ABNORMAL LOW (ref 65–99)
Potassium: 3.8 mmol/L (ref 3.5–5.1)
Sodium: 137 mmol/L (ref 135–145)

## 2016-12-15 LAB — CBG MONITORING, ED
GLUCOSE-CAPILLARY: 43 mg/dL — AB (ref 65–99)
Glucose-Capillary: 47 mg/dL — ABNORMAL LOW (ref 65–99)
Glucose-Capillary: 58 mg/dL — ABNORMAL LOW (ref 65–99)
Glucose-Capillary: 95 mg/dL (ref 65–99)

## 2016-12-15 LAB — CBC WITH DIFFERENTIAL/PLATELET
Basophils Absolute: 0 10*3/uL (ref 0.0–0.1)
Basophils Relative: 0 %
EOS ABS: 0.1 10*3/uL (ref 0.0–0.7)
Eosinophils Relative: 1 %
HCT: 41.9 % (ref 39.0–52.0)
Hemoglobin: 14.3 g/dL (ref 13.0–17.0)
LYMPHS ABS: 2 10*3/uL (ref 0.7–4.0)
LYMPHS PCT: 16 %
MCH: 30.8 pg (ref 26.0–34.0)
MCHC: 34.1 g/dL (ref 30.0–36.0)
MCV: 90.1 fL (ref 78.0–100.0)
Monocytes Absolute: 1.3 10*3/uL — ABNORMAL HIGH (ref 0.1–1.0)
Monocytes Relative: 10 %
NEUTROS PCT: 73 %
Neutro Abs: 9.4 10*3/uL — ABNORMAL HIGH (ref 1.7–7.7)
Platelets: 190 10*3/uL (ref 150–400)
RBC: 4.65 MIL/uL (ref 4.22–5.81)
RDW: 12.7 % (ref 11.5–15.5)
WBC: 12.8 10*3/uL — AB (ref 4.0–10.5)

## 2016-12-15 LAB — I-STAT CG4 LACTIC ACID, ED: LACTIC ACID, VENOUS: 1.03 mmol/L (ref 0.5–1.9)

## 2016-12-15 MED ORDER — SODIUM CHLORIDE 0.9 % IV BOLUS (SEPSIS)
1000.0000 mL | Freq: Once | INTRAVENOUS | Status: AC
  Administered 2016-12-15: 1000 mL via INTRAVENOUS

## 2016-12-15 NOTE — ED Notes (Signed)
Pt up to bathroom.

## 2016-12-15 NOTE — ED Notes (Signed)
MD notified of cbg. States to feed pt.  Pt offered orange juice and crackers.

## 2016-12-15 NOTE — ED Notes (Signed)
Pt eating lunch

## 2016-12-15 NOTE — ED Provider Notes (Signed)
[facility] EMERGENCY DEPARTMENT Provider Note   CSN: [id number] Arrival date & time: 12/15/16  4034     History   Chief Complaint Chief Complaint  Patient presents with  . Weakness    HPI Cristian Welch is a 56 y.o. male.  HPI  56 year old male with a history of type 2 diabetes presents with dizziness/lightheadedness.  States it started 2 days ago and has been intermittent.  He has not noticed it while sitting still or lying down.  Last night when he was in [employer] walking around he states he was quite dizzy and he had blurred vision.  It feels both like he is off balance and about to pass out.  No recent change in his medicines.  He is not sure exactly what he is on but he is on 8 pills in the morning and 4 at night.  He has recently cut out all sweets including candy, soda, tea over the last 2 or 3 weeks.  He denies any headache, current blurry vision, current dizziness, chest pain, shortness of breath, abdominal pain, vomiting, diarrhea, or fevers.  He currently is asymptomatic.  He does have a swollen red area on his left upper arm that has been present since last week when he received influenza and Tdap immunizations in that arm.  He does not think it is worsening.  Past Medical History:  Diagnosis Date  . Allergic rhinitis   . Arthritis   . At risk for sleep apnea    STOP-BANG= 4    SENT TO PCP 11-28-2013  . Exercise-induced asthma   . GERD (gastroesophageal reflux disease)   . Glaucoma   . History of MRSA infection    05/ 2014--  POSITIVE MRSA CULTURE SCROTAL ABSCESS  . Prostate cancer [facility])    s/p  prostatectomy 08/ 2014  . Skin tag of anus   . Type 2 diabetes mellitus [facility])     Patient Active Problem List   Diagnosis Date Noted  . Hemorrhoids 05/13/2012  . ONYCHOMYCOSIS, TOENAILS 11/06/2009  . 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 ELEVATED BP READING WITHOUT DX HYPERTENSION 11/06/2009  . ELEVATED PROSTATE SPECIFIC ANTIGEN 09/18/2009  . DIABETES  MELLITUS, TYPE II 08/06/2009  . GLYCOSURIA 08/06/2009  . VENTRAL HERNIA 05/07/2009  . RECTAL BLEEDING 05/07/2009  . DYSPNEA ON EXERTION 05/07/2009    Past Surgical History:  Procedure Laterality Date  . CIRCUMCISION N/A 08/04/2012   Procedure: CIRCUMCISION ADULT;  Surgeon: Molli Hazard, MD;  Location: [facility];  Service: Urology;  Laterality: N/A;  90 MIN ALSO PROSTATE NERVE BLOCK   . EXCISION OF SKIN TAG N/A 12/01/2013   Procedure: EXCISION OF ANAL SKIN TAG;  Surgeon: Leighton Ruff, MD;  Location: [facility];  Service: General;  Laterality: N/A;  . FOOT SURGERY Left 1994 (approx)  . INGUINAL HERNIA REPAIR Right 2000 (approx)  . IRRIGATION AND DEBRIDEMENT ABSCESS Right 07/07/2012   Procedure: IRRIGATION AND DEBRIDEMENT ABSCESS;  Surgeon: Molli Hazard, MD;  Location: [facility];  Service: Urology;  Laterality: Right;  and scrotal abcesses  . LYMPHADENECTOMY Bilateral 10/06/2012   Procedure: WITH BILATERAL PELVIC LYMPH NODE DISSECTION ;  Surgeon: Molli Hazard, MD;  Location: [facility];  Service: Urology;  Laterality: Bilateral;  . PROSTATE BIOPSY N/A 08/04/2012   Procedure: BIOPSY TRANSRECTAL ULTRASONIC PROSTATE (TUBP);  Surgeon: Molli Hazard, MD;  Location: [facility];  Service: Urology;  Laterality: N/A;  . ROBOT ASSISTED LAPAROSCOPIC RADICAL  PROSTATECTOMY N/A 10/06/2012   Procedure: ROBOTIC ASSISTED LAPAROSCOPIC RADICAL PROSTATECTOMY LEVEL 2;  Surgeon: Molli Hazard, MD;  Location: [facility];  Service: Urology;  Laterality: N/A;  . TRANSTHORACIC ECHOCARDIOGRAM  06-06-2009   NORMAL LVF AND LVSF/ EF 55-60%       Home Medications    Prior to Admission medications   Medication Sig Start Date End Date Taking? Authorizing Provider  acetaminophen (TYLENOL) 500 MG tablet Take 1,000 mg by mouth every 6 (six) hours as needed for mild pain or moderate pain.     [provider]    acyclovir (ZOVIRAX) 400 MG tablet Take 1 tablet (400 mg total) by mouth 4 (four) times daily. 02/06/15   Deno Etienne, DO  albuterol (PROVENTIL HFA;VENTOLIN HFA) 108 (90 BASE) MCG/ACT inhaler Inhale 2 puffs into the lungs every 6 (six) hours as needed for wheezing.    [provider]  cetirizine (ZYRTEC) 10 MG tablet Take 10 mg by mouth daily.    [provider]  diclofenac (VOLTAREN) 75 MG EC tablet Take 75 mg by mouth 2 (two) times daily.    [provider]  DORZOLAMIDE HCL OP Apply 1 drop to eye 3 (three) times daily.    [provider]  HYDROcodone-acetaminophen (NORCO/VICODIN) 5-325 MG per tablet Take 1-2 tablets by mouth every 4 (four) hours as needed. Patient not taking: Reported on 02/06/2015 24/4/01   Leighton Ruff, MD  lovastatin (MEVACOR) 20 MG tablet Take 20 mg by mouth every morning.    [provider]  travoprost, benzalkonium, (TRAVATAN) 0.004 % ophthalmic solution Place 1 drop into both eyes at bedtime.    [provider]    Family History History reviewed. No pertinent family history.  Social History Social History  Substance Use Topics  . Smoking status: Former Smoker    Packs/day: 0.25    Years: 20.00    Types: Cigarettes    Quit date: 02/24/2002  . Smokeless tobacco: Never Used  . Alcohol use No     Allergies   Cephalexin   Review of Systems Review of Systems  Constitutional: Negative for fever.  Respiratory: Negative for cough and shortness of breath.   Cardiovascular: Negative for chest pain.  Gastrointestinal: Negative for abdominal pain, diarrhea and vomiting.  Genitourinary: Negative for dysuria.  Musculoskeletal: Positive for myalgias.  Skin: Positive for color change. Negative for wound.  Neurological: Positive for dizziness and light-headedness. Negative for syncope, weakness and numbness.  All other systems reviewed and are negative.    Physical Exam Updated Vital Signs BP 121/79   Pulse  84   Temp 97.7 F (36.5 C) (Oral)   Resp 16   SpO2 100%   Physical Exam  Constitutional: He is oriented to person, place, and time. He appears well-developed and well-nourished.  HENT:  Head: Normocephalic and atraumatic.  Right Ear: External ear normal.  Left Ear: External ear normal.  Nose: Nose normal.  Mouth/Throat: Mucous membranes are dry.  Eyes: Pupils are equal, round, and reactive to light. EOM are normal. Right eye exhibits no discharge. Left eye exhibits no discharge.  Neck: Normal range of motion. Neck supple.  Cardiovascular: Normal rate, regular rhythm and normal heart sounds.   Pulmonary/Chest: Effort normal and breath sounds normal.  Abdominal: Soft. There is no tenderness.  Musculoskeletal: He exhibits no edema.       Arms: Neurological: He is alert and oriented to person, place, and time.  CN 3-12 grossly intact. 5/5 strength in all 4 extremities.  Grossly normal sensation. Normal finger to nose.   Skin: Skin is warm and dry.  Nursing note and vitals reviewed.    ED Treatments / Results  Labs (all labs ordered are listed, but only abnormal results are displayed) Labs Reviewed  BASIC METABOLIC PANEL - Abnormal; Notable for the following:       Result Value   Glucose, Bld 52 (*)    BUN 23 (*)    Creatinine, Ser 1.26 (*)    All other components within normal limits  CBC WITH DIFFERENTIAL/PLATELET - Abnormal; Notable for the following:    WBC 12.8 (*)    Neutro Abs 9.4 (*)    Monocytes Absolute 1.3 (*)    All other components within normal limits  CBG MONITORING, ED - Abnormal; Notable for the following:    Glucose-Capillary 47 (*)    All other components within normal limits  CBG MONITORING, ED - Abnormal; Notable for the following:    Glucose-Capillary 58 (*)    All other components within normal limits  CBG MONITORING, ED - Abnormal; Notable for the following:    Glucose-Capillary 43 (*)    All other components within normal limits  URINALYSIS,  ROUTINE W REFLEX MICROSCOPIC  I-STAT CG4 LACTIC ACID, ED  I-STAT CG4 LACTIC ACID, ED  CBG MONITORING, ED    EKG  EKG Interpretation  Date/Time:  Monday December 15 2016 09:21:34 EDT Ventricular Rate:  75 PR Interval:  138 QRS Duration: 78 QT Interval:  366 QTC Calculation: 408 R Axis:   43 Text Interpretation:  Normal sinus rhythm Normal ECG no significant change since 2015 Confirmed by Sherwood Gambler [id number]) on 12/15/2016 10:15:04 AM       Radiology No results found.  Procedures Procedures (including critical care time)  Medications Ordered in ED Medications  sodium chloride 0.9 % bolus 1,000 mL (0 mLs Intravenous Stopped 12/15/16 1227)  sodium chloride 0.9 % bolus 1,000 mL (0 mLs Intravenous Stopped 12/15/16 1125)     Initial Impression / Assessment and Plan / ED Course  I have reviewed the triage vital signs and the nursing notes.  Pertinent labs & imaging results that were available during my care of the patient were reviewed by me and considered in my medical decision making (see chart for details).     After initial fluids, patient's blood pressure has improved and has stayed above 604 systolic.  He has had no dizziness in the ED and has walked to the bathroom multiple times.  His glucose has been low although he has not been symptomatic in the ED.  He did not want to eat a [place] sandwich because he does not eat [place] and thus we had to wait for a meal from the cafeteria to come up because graham crackers and coke were not helping.  Now his glucose is up to 95.  He is still ambulating normally.  I think most likely his hypoglycemia is from cutting out significant carbs but still being on glimepiride.  This will be stopped.  He does have a small bump in his creatinine.  He is currently having some diarrhea and thus was given IV fluids.  Told to stop lisinopril as this is likely contributing to the hypotension.  Call PCP and follow-up this week.  He appears to have an  allergic reaction or hypersensitivity to the immunizations he received his left arm.  However I do not think this represents cellulitis. It is not worsening over 1 week. Stable for d/c  home.  Discussed return precautions.  Final Clinical Impressions(s) / ED Diagnoses   Final diagnoses:  Hypoglycemia  Hypotension, unspecified hypotension type    New Prescriptions Discharge Medication List as of 12/15/2016  2:28 PM       Sherwood Gambler, MD 12/15/16 [phone]

## 2016-12-15 NOTE — Discharge Instructions (Signed)
Be sure to drink plenty of fluids.  You need to stop Amaryl/glimepiride, lisinopril, and metformin.  Check your blood sugar if you develop dizziness or lightheadedness again and be sure to eat something with sugar in it.  Call your doctor today and follow-up closely this week for medication adjustment and reevaluation.

## 2016-12-15 NOTE — ED Triage Notes (Signed)
Pt states he stopped eating sweets and drinking soft drinks 2 weeks ago. He states he has had intermittent weakness and dizziness. Pt alert and oriented, skin warm and dry.

## 2016-12-15 NOTE — ED Notes (Signed)
Ordered food tray for pt. MD notified.

## 2017-06-12 ENCOUNTER — Encounter (HOSPITAL_COMMUNITY): Payer: Self-pay | Admitting: Emergency Medicine

## 2017-06-12 ENCOUNTER — Other Ambulatory Visit: Payer: Self-pay

## 2017-06-12 ENCOUNTER — Emergency Department (HOSPITAL_COMMUNITY)
Admission: EM | Admit: 2017-06-12 | Discharge: 2017-06-12 | Disposition: A | Discharge status: Home / Self Care | Payer: Medicaid Other | Attending: Emergency Medicine | Admitting: Emergency Medicine

## 2017-06-12 DIAGNOSIS — J301 Allergic rhinitis due to pollen: Secondary | ICD-10-CM | POA: Diagnosis not present

## 2017-06-12 DIAGNOSIS — Z87891 Personal history of nicotine dependence: Secondary | ICD-10-CM | POA: Insufficient documentation

## 2017-06-12 DIAGNOSIS — Z8546 Personal history of malignant neoplasm of prostate: Secondary | ICD-10-CM | POA: Insufficient documentation

## 2017-06-12 DIAGNOSIS — Z79899 Other long term (current) drug therapy: Secondary | ICD-10-CM | POA: Insufficient documentation

## 2017-06-12 DIAGNOSIS — E119 Type 2 diabetes mellitus without complications: Secondary | ICD-10-CM | POA: Insufficient documentation

## 2017-06-12 DIAGNOSIS — R0981 Nasal congestion: Secondary | ICD-10-CM | POA: Diagnosis present

## 2017-06-12 MED ORDER — FLUTICASONE PROPIONATE 50 MCG/ACT NA SUSP: 1.0000 | Freq: Every day | NASAL | 0 refills | Status: AC

## 2017-06-12 NOTE — ED Provider Notes (Signed)
[facility] EMERGENCY DEPARTMENT Provider Note   CSN: [id number] Arrival date & time: 06/12/17  1505     History   Chief Complaint Chief Complaint  Patient presents with  . Sinusitis    HPI Holten Spano is a 57 y.o. male.  HPI   57 year old male presenting for evaluation of sinus pain.  Patient report for the past 2 days he has had sinus congestion, sinus headache, clear nasal drainage, teary eyes, sneezing and coughing.  There is no associated fever, chest pain, trouble breathing.  He did try over-the-counter Tylenol with minimal relief.  Does have history of allergic rhinitis.  Past Medical History:  Diagnosis Date  . Allergic rhinitis   . Arthritis   . At risk for sleep apnea    STOP-BANG= 4    SENT TO PCP 11-28-2013  . Exercise-induced asthma   . GERD (gastroesophageal reflux disease)   . Glaucoma   . History of MRSA infection    05/ 2014--  POSITIVE MRSA CULTURE SCROTAL ABSCESS  . Prostate cancer [facility])    s/p  prostatectomy 08/ 2014  . Skin tag of anus   . Type 2 diabetes mellitus [facility])     Patient Active Problem List   Diagnosis Date Noted  . Hemorrhoids 05/13/2012  . ONYCHOMYCOSIS, TOENAILS 11/06/2009  . 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 ELEVATED BP READING WITHOUT DX HYPERTENSION 11/06/2009  . ELEVATED PROSTATE SPECIFIC ANTIGEN 09/18/2009  . DIABETES MELLITUS, TYPE II 08/06/2009  . GLYCOSURIA 08/06/2009  . VENTRAL HERNIA 05/07/2009  . RECTAL BLEEDING 05/07/2009  . DYSPNEA ON EXERTION 05/07/2009    Past Surgical History:  Procedure Laterality Date  . CIRCUMCISION N/A 08/04/2012   Procedure: CIRCUMCISION ADULT;  Surgeon: Molli Hazard, MD;  [facility];  Service: Urology;  Laterality: N/A;  90 MIN ALSO PROSTATE NERVE BLOCK   . EXCISION OF SKIN TAG N/A 12/01/2013   Procedure: EXCISION OF ANAL SKIN TAG;  Surgeon: Leighton Ruff, MD;  Location: [facility];  Service: General;  Laterality:  N/A;  . FOOT SURGERY Left 1994 (approx)  . INGUINAL HERNIA REPAIR Right 2000 (approx)  . IRRIGATION AND DEBRIDEMENT ABSCESS Right 07/07/2012   Procedure: IRRIGATION AND DEBRIDEMENT ABSCESS;  Surgeon: Molli Hazard, MD;  Location: [facility];  Service: Urology;  Laterality: Right;  and scrotal abcesses  . LYMPHADENECTOMY Bilateral 10/06/2012   Procedure: WITH BILATERAL PELVIC LYMPH NODE DISSECTION ;  Surgeon: Molli Hazard, MD;  Location: [facility];  Service: Urology;  Laterality: Bilateral;  . PROSTATE BIOPSY N/A 08/04/2012   Procedure: BIOPSY TRANSRECTAL ULTRASONIC PROSTATE (TUBP);  Surgeon: Molli Hazard, MD;  [facility];  Service: Urology;  Laterality: N/A;  . ROBOT ASSISTED LAPAROSCOPIC RADICAL PROSTATECTOMY N/A 10/06/2012   Procedure: ROBOTIC ASSISTED LAPAROSCOPIC RADICAL PROSTATECTOMY LEVEL 2;  Surgeon: Molli Hazard, MD;  Location: [facility];  Service: Urology;  Laterality: N/A;  . TRANSTHORACIC ECHOCARDIOGRAM  06-06-2009   NORMAL LVF AND LVSF/ EF 55-60%        Home Medications    Prior to Admission medications   Medication Sig Start Date End Date Taking? Authorizing Provider  acetaminophen (TYLENOL) 500 MG tablet Take 1,000 mg by mouth every 6 (six) hours as needed for mild pain or moderate pain.     [provider]  acyclovir (ZOVIRAX) 400 MG tablet Take 1 tablet (400 mg total) by mouth 4 (four) times daily. 02/06/15   Deno Etienne, DO  albuterol (PROVENTIL HFA;VENTOLIN HFA) 108 (90 BASE) MCG/ACT inhaler Inhale 2 puffs into the lungs every 6 (six) hours as needed for wheezing.    [provider]  cetirizine (ZYRTEC) 10 MG tablet Take 10 mg by mouth daily.    [provider]  diclofenac (VOLTAREN) 75 MG EC tablet Take 75 mg by mouth 2 (two) times daily.    [provider]  DORZOLAMIDE HCL OP Apply 1 drop to eye 3 (three) times daily.    [provider]    HYDROcodone-acetaminophen (NORCO/VICODIN) 5-325 MG per tablet Take 1-2 tablets by mouth every 4 (four) hours as needed. Patient not taking: Reported on 02/06/2015 94/8/54   Leighton Ruff, MD  lovastatin (MEVACOR) 20 MG tablet Take 20 mg by mouth every morning.    [provider]  travoprost, benzalkonium, (TRAVATAN) 0.004 % ophthalmic solution Place 1 drop into both eyes at bedtime.    [provider]    Family History No family history on file.  Social History Social History   Tobacco Use  . Smoking status: Former Smoker    Packs/day: 0.25    Years: 20.00    Pack years: 5.00    Types: Cigarettes    Last attempt to quit: 02/24/2002    Years since quitting: 15.3  . Smokeless tobacco: Never Used  Substance Use Topics  . Alcohol use: No  . Drug use: No    Types: Cocaine, Marijuana, LSD    Comment: last used (approx.  2000)     Allergies   Cephalexin   Review of Systems Review of Systems  Constitutional: Negative for fever.  HENT: Positive for postnasal drip, rhinorrhea, sinus pressure and sneezing. Negative for facial swelling and sinus pain.   Neurological: Positive for headaches.     Physical Exam Updated Vital Signs BP 128/85 (BP Location: Right Arm)   Pulse 97   Temp 98.3 F (36.8 C) (Oral)   Resp 16   Ht 6\' 1"  (1.854 m)   Wt 107.5 kg (237 lb)   SpO2 99%   BMI 31.27 kg/m   Physical Exam  Constitutional: He appears well-developed and well-nourished. No distress.  HENT:  Head: Atraumatic.  Ears: TMs normal bilaterally Nose: Normal nares Throat: Mild postnasal drip no erythema noted No sinus tenderness to percussion  Eyes: Conjunctivae are normal.  Neck: Neck supple.  Cardiovascular: Normal rate and regular rhythm.  Pulmonary/Chest: Effort normal and breath sounds normal.  Lymphadenopathy:    He has no cervical adenopathy.  Neurological: He is alert.  Skin: No rash noted.  Psychiatric: He has a normal mood and affect.  Nursing  note and vitals reviewed.    ED Treatments / Results  Labs (all labs ordered are listed, but only abnormal results are displayed) Labs Reviewed - No data to display  EKG None  Radiology No results found.  Procedures Procedures (including critical care time)  Medications Ordered in ED Medications - No data to display   Initial Impression / Assessment and Plan / ED Course  I have reviewed the triage vital signs and the nursing notes.  Pertinent labs & imaging results that were available during my care of the patient were reviewed by me and considered in my medical decision making (see chart for details).     BP 128/85 (BP Location: Right Arm)   Pulse 97   Temp 98.3 F (36.8 C) (Oral)   Resp 16   Ht 6\' 1"  (1.854 m)   Wt 107.5 kg (237  lb)   SpO2 99%   BMI 31.27 kg/m    Final Clinical Impressions(s) / ED Diagnoses   Final diagnoses:  Seasonal allergic rhinitis due to pollen    ED Discharge Orders        Ordered    fluticasone (FLONASE) 50 MCG/ACT nasal spray  Daily     06/12/17 1802     6:01 PM Patient presents with symptoms suggestive of allergic rhinitis.  Low suspicion of sinusitis or other acute emergent medical condition.  Will prescribe Flonase and demonstrate appropriate use.  Patient to follow-up with PCP as needed.   Domenic Moras, PA-C 06/12/17 1805    Orlie Dakin, MD 06/13/17 [phone]

## 2017-06-12 NOTE — ED Triage Notes (Signed)
Pt reports sinus and allergy problems x 2 days. Pt states he thinks he has a sinus infection.

## 2017-10-21 ENCOUNTER — Emergency Department (HOSPITAL_COMMUNITY)
Admission: EM | Admit: 2017-10-21 | Discharge: 2017-10-21 | Disposition: A | Discharge status: Home / Self Care | Payer: Medicaid Other | Attending: Emergency Medicine | Admitting: Emergency Medicine

## 2017-10-21 ENCOUNTER — Encounter (HOSPITAL_COMMUNITY): Payer: Self-pay | Admitting: Emergency Medicine

## 2017-10-21 DIAGNOSIS — E119 Type 2 diabetes mellitus without complications: Secondary | ICD-10-CM | POA: Diagnosis not present

## 2017-10-21 DIAGNOSIS — Z79899 Other long term (current) drug therapy: Secondary | ICD-10-CM | POA: Diagnosis not present

## 2017-10-21 DIAGNOSIS — T63454A Toxic effect of venom of hornets, undetermined, initial encounter: Secondary | ICD-10-CM | POA: Diagnosis not present

## 2017-10-21 DIAGNOSIS — L509 Urticaria, unspecified: Secondary | ICD-10-CM | POA: Insufficient documentation

## 2017-10-21 DIAGNOSIS — Z8546 Personal history of malignant neoplasm of prostate: Secondary | ICD-10-CM | POA: Diagnosis not present

## 2017-10-21 DIAGNOSIS — Z87891 Personal history of nicotine dependence: Secondary | ICD-10-CM | POA: Insufficient documentation

## 2017-10-21 NOTE — Discharge Instructions (Addendum)
Please read attached information. If you experience any new or worsening signs or symptoms please return to the emergency room for evaluation. Please follow-up with your primary care provider or specialist as discussed. Please use Benadryl or Zantac as needed.

## 2017-10-21 NOTE — ED Triage Notes (Signed)
Pt to ER for evaluation of multiple yellow jacket stings to back. Pt has no known allergy to this. Pt reports taking 50 mg benadryl prior to coming here.

## 2017-10-21 NOTE — ED Provider Notes (Signed)
[facility] EMERGENCY DEPARTMENT Provider Note   CSN: [id number] Arrival date & time: 10/21/17  1228     History   Chief Complaint Chief Complaint  Patient presents with  . Insect Bite    HPI Cristian Welch is a 57 y.o. male.  HPI   57 year old male presents today after being stung by wasps. Patient notes he was standing on a nest of wasps and was bit several times in the face and upper extremities chest abdomen back. Patient reports that he is not allergic, has no environmental allergies. Patient notes that he took Benadryl prior to arrival. He denies any swelling of the mouth throat, difficulty swallowing or breathing.  Past Medical History:  Diagnosis Date  . Allergic rhinitis   . Arthritis   . At risk for sleep apnea    STOP-BANG= 4    SENT TO PCP 11-28-2013  . Exercise-induced asthma   . GERD (gastroesophageal reflux disease)   . Glaucoma   . History of MRSA infection    05/ 2014--  POSITIVE MRSA CULTURE SCROTAL ABSCESS  . Prostate cancer [facility])    s/p  prostatectomy 08/ 2014  . Skin tag of anus   . Type 2 diabetes mellitus [facility])     Patient Active Problem List   Diagnosis Date Noted  . Hemorrhoids 05/13/2012  . ONYCHOMYCOSIS, TOENAILS 11/06/2009  . 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 ELEVATED BP READING WITHOUT DX HYPERTENSION 11/06/2009  . ELEVATED PROSTATE SPECIFIC ANTIGEN 09/18/2009  . DIABETES MELLITUS, TYPE II 08/06/2009  . GLYCOSURIA 08/06/2009  . VENTRAL HERNIA 05/07/2009  . RECTAL BLEEDING 05/07/2009  . DYSPNEA ON EXERTION 05/07/2009    Past Surgical History:  Procedure Laterality Date  . CIRCUMCISION N/A 08/04/2012   Procedure: CIRCUMCISION ADULT;  Surgeon: Molli Hazard, MD;  Location: [facility];  Service: Urology;  Laterality: N/A;  90 MIN ALSO PROSTATE NERVE BLOCK   . EXCISION OF SKIN TAG N/A 12/01/2013   Procedure: EXCISION OF ANAL SKIN TAG;  Surgeon: Leighton Ruff, MD;  Location: [place];  Service: General;  Laterality: N/A;  . FOOT SURGERY Left 1994 (approx)  . INGUINAL HERNIA REPAIR Right 2000 (approx)  . IRRIGATION AND DEBRIDEMENT ABSCESS Right 07/07/2012   Procedure: IRRIGATION AND DEBRIDEMENT ABSCESS;  Surgeon: Molli Hazard, MD;  [facility];  Service: Urology;  Laterality: Right;  and scrotal abcesses  . LYMPHADENECTOMY Bilateral 10/06/2012   Procedure: WITH BILATERAL PELVIC LYMPH NODE DISSECTION ;  Surgeon: Molli Hazard, MD;  Location: [facility];  Service: Urology;  Laterality: Bilateral;  . PROSTATE BIOPSY N/A 08/04/2012   Procedure: BIOPSY TRANSRECTAL ULTRASONIC PROSTATE (TUBP);  Surgeon: Molli Hazard, MD;  [facility]  Service: Urology;  Laterality: N/A;  . ROBOT ASSISTED LAPAROSCOPIC RADICAL PROSTATECTOMY N/A 10/06/2012   Procedure: ROBOTIC ASSISTED LAPAROSCOPIC RADICAL PROSTATECTOMY LEVEL 2;  Surgeon: Molli Hazard, MD;  Location: [facility];  Service: Urology;  Laterality: N/A;  . TRANSTHORACIC ECHOCARDIOGRAM  06-06-2009   NORMAL LVF AND LVSF/ EF 55-60%        Home Medications    Prior to Admission medications   Medication Sig Start Date End Date Taking? Authorizing Provider  acetaminophen (TYLENOL) 500 MG tablet Take 1,000 mg by mouth every 6 (six) hours as needed for mild pain or moderate pain.     [provider]  acyclovir (ZOVIRAX) 400 MG tablet Take 1 tablet (400 mg total) by mouth 4 (four)  times daily. 02/06/15   Deno Etienne, DO  albuterol (PROVENTIL HFA;VENTOLIN HFA) 108 (90 BASE) MCG/ACT inhaler Inhale 2 puffs into the lungs every 6 (six) hours as needed for wheezing.    [provider]  cetirizine (ZYRTEC) 10 MG tablet Take 10 mg by mouth daily.    [provider]  diclofenac (VOLTAREN) 75 MG EC tablet Take 75 mg by mouth 2 (two) times daily.    [provider]  DORZOLAMIDE HCL OP Apply 1 drop to eye 3 (three) times daily.    [provider]  fluticasone (FLONASE) 50 MCG/ACT nasal spray Place 1 spray into both nostrils daily. 06/12/17   Domenic Moras, PA-C  HYDROcodone-acetaminophen (NORCO/VICODIN) 5-325 MG per tablet Take 1-2 tablets by mouth every 4 (four) hours as needed. Patient not taking: Reported on 02/06/2015 41/7/40   Leighton Ruff, MD  lovastatin (MEVACOR) 20 MG tablet Take 20 mg by mouth every morning.    [provider]  travoprost, benzalkonium, (TRAVATAN) 0.004 % ophthalmic solution Place 1 drop into both eyes at bedtime.    [provider]    Family History History reviewed. No pertinent family history.  Social History Social History   Tobacco Use  . Smoking status: Former Smoker    Packs/day: 0.25    Years: 20.00    Pack years: 5.00    Types: Cigarettes    Last attempt to quit: 02/24/2002    Years since quitting: 15.6  . Smokeless tobacco: Never Used  Substance Use Topics  . Alcohol use: No  . Drug use: No    Types: Cocaine, Marijuana, LSD    Comment: last used (approx.  2000)     Allergies   Cephalexin   Review of Systems Review of Systems  All other systems reviewed and are negative.    Physical Exam Updated Vital Signs BP 135/86 (BP Location: Right Arm)   Pulse (!) 103   Temp 98 F (36.7 C) (Oral)   Resp 18   SpO2 96%   Physical Exam  Constitutional: He is oriented to person, place, and time. He appears well-developed and well-nourished.  HENT:  Head: Normocephalic and atraumatic.  Oropharynx clear, voice normal no respiratory distress  Eyes: Pupils are equal, round, and reactive to light. Conjunctivae are normal. Right eye exhibits no discharge. Left eye exhibits no discharge. No scleral icterus.  Neck: Normal range of motion. No JVD present. No tracheal deviation present.  Pulmonary/Chest: Effort normal. No stridor.  Neurological: He is alert and oriented to person, place, and time. Coordination normal.  Skin:  Multiple hives noted to the face  chest back abdomen and upper extremities, no stingers noted  Psychiatric: He has a normal mood and affect. His behavior is normal. Judgment and thought content normal.  Nursing note and vitals reviewed.    ED Treatments / Results  Labs (all labs ordered are listed, but only abnormal results are displayed) Labs Reviewed - No data to display  EKG None  Radiology No results found.  Procedures Procedures (including critical care time)  Medications Ordered in ED Medications - No data to display   Initial Impression / Assessment and Plan / ED Course  I have reviewed the triage vital signs and the nursing notes.  Pertinent labs & imaging results that were available during my care of the patient were reviewed by me and considered in my medical decision making (see chart for details).     58 year old male presents today after being stung by  wasps he is well-appearing no acute distress this was approximately 2-1/2 hours ago. No signs of significant allergic reaction. Patient discharged with instructions to use Benadryl and Zantac, follow-up immediately if he has any worsening signs or symptoms.patient will go home and shower with soap and water. Patient verbalized understanding and agreement to today's plan.  Final Clinical Impressions(s) / ED Diagnoses   Final diagnoses:  Hornet sting, undetermined intent, initial encounter    ED Discharge Orders    None       Francee Gentile 10/21/17 1406    Jola Schmidt, MD 10/21/17 2125

## 2017-10-21 NOTE — ED Notes (Signed)
Pt with multiple stings to his back and flank, redness localized to sites, denies shortness of breath or airway swelling

## 2017-11-22 ENCOUNTER — Other Ambulatory Visit: Payer: Self-pay

## 2017-11-22 ENCOUNTER — Encounter (HOSPITAL_COMMUNITY): Payer: Self-pay | Admitting: Emergency Medicine

## 2017-11-22 ENCOUNTER — Emergency Department (HOSPITAL_COMMUNITY): Payer: Medicaid Other

## 2017-11-22 ENCOUNTER — Emergency Department (HOSPITAL_COMMUNITY)
Admission: EM | Admit: 2017-11-22 | Discharge: 2017-11-22 | Disposition: A | Discharge status: Home / Self Care | Payer: Medicaid Other | Attending: Emergency Medicine | Admitting: Emergency Medicine

## 2017-11-22 DIAGNOSIS — Z8546 Personal history of malignant neoplasm of prostate: Secondary | ICD-10-CM | POA: Insufficient documentation

## 2017-11-22 DIAGNOSIS — M25511 Pain in right shoulder: Secondary | ICD-10-CM | POA: Diagnosis not present

## 2017-11-22 DIAGNOSIS — E119 Type 2 diabetes mellitus without complications: Secondary | ICD-10-CM | POA: Diagnosis not present

## 2017-11-22 DIAGNOSIS — Z79899 Other long term (current) drug therapy: Secondary | ICD-10-CM | POA: Diagnosis not present

## 2017-11-22 DIAGNOSIS — Z87891 Personal history of nicotine dependence: Secondary | ICD-10-CM | POA: Insufficient documentation

## 2017-11-22 MED ORDER — KETOROLAC TROMETHAMINE 30 MG/ML IJ SOLN
30.0000 mg | Freq: Once | INTRAMUSCULAR | Status: AC
  Administered 2017-11-22: 30 mg via INTRAMUSCULAR
  Filled 2017-11-22: qty 1

## 2017-11-22 MED ORDER — ACETAMINOPHEN 500 MG PO TABS: 500.0000 mg | ORAL_TABLET | Freq: Four times a day (QID) | ORAL | 0 refills | Status: DC | PRN

## 2017-11-22 MED ORDER — NAPROXEN 500 MG PO TABS: 500.0000 mg | ORAL_TABLET | Freq: Two times a day (BID) | ORAL | 0 refills | Status: DC

## 2017-11-22 NOTE — Discharge Instructions (Signed)
Take Naprosyn and Tylenol as prescribed for your pain.  Do not combine these medications with water you have at home.  Use ice at least 3-4 times daily alternating 20 minutes on, 20 minutes off.  Wear your sling for comfort, but make sure to take your arm out a few times daily to prevent a frozen shoulder.  Please follow-up with Dr. Marlou Sa, the orthopedist, for further evaluation and treatment of your shoulder pain.  Please return the emergency department if you develop any new or worsening symptoms.

## 2017-11-22 NOTE — ED Triage Notes (Signed)
Pt. Stated, I was moving a TV a week ago and hurt my rt. Arm and shoulder, and its still hurting and I cant sleep

## 2017-11-22 NOTE — ED Provider Notes (Signed)
[facility] EMERGENCY DEPARTMENT Provider Note   CSN: [id number] Arrival date & time: 11/22/17  0848     History   Chief Complaint Chief Complaint  Patient presents with  . Shoulder Pain    right  . Arm Pain    HPI Cristian Welch is a 57 y.o. male with history of diabetes who presents with a 2-week history of right shoulder pain.  Patient reports he has had pain in his anterior shoulder some radiation to his elbow occasionally.  He denies any numbness or tingling.  He reports it is been hurting since he was moving a TV.  He is right-handed.  He denies any neck or back pain.  He denies any wrist or forearm.  He tried the pain medication that his doctor gave him for something else, however he is unsure what kind it is.  He has not tried anything else.  HPI  Past Medical History:  Diagnosis Date  . Allergic rhinitis   . Arthritis   . At risk for sleep apnea    STOP-BANG= 4    SENT TO PCP 11-28-2013  . Exercise-induced asthma   . GERD (gastroesophageal reflux disease)   . Glaucoma   . History of MRSA infection    05/ 2014--  POSITIVE MRSA CULTURE SCROTAL ABSCESS  . Prostate cancer [facility])    s/p  prostatectomy 08/ 2014  . Skin tag of anus   . Type 2 diabetes mellitus [facility])     Patient Active Problem List   Diagnosis Date Noted  . Hemorrhoids 05/13/2012  . ONYCHOMYCOSIS, TOENAILS 11/06/2009  . 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 ELEVATED BP READING WITHOUT DX HYPERTENSION 11/06/2009  . ELEVATED PROSTATE SPECIFIC ANTIGEN 09/18/2009  . DIABETES MELLITUS, TYPE II 08/06/2009  . GLYCOSURIA 08/06/2009  . VENTRAL HERNIA 05/07/2009  . RECTAL BLEEDING 05/07/2009  . DYSPNEA ON EXERTION 05/07/2009    Past Surgical History:  Procedure Laterality Date  . CIRCUMCISION N/A 08/04/2012   Procedure: CIRCUMCISION ADULT;  Surgeon: Molli Hazard, MD;  Location: [facility];  Service: Urology;  Laterality: N/A;  90 MIN ALSO PROSTATE NERVE BLOCK   .  EXCISION OF SKIN TAG N/A 12/01/2013   Procedure: EXCISION OF ANAL SKIN TAG;  Surgeon: Leighton Ruff, MD;  Location: [place];  Service: General;  Laterality: N/A;  . FOOT SURGERY Left 1994 (approx)  . INGUINAL HERNIA REPAIR Right 2000 (approx)  . IRRIGATION AND DEBRIDEMENT ABSCESS Right 07/07/2012   Procedure: IRRIGATION AND DEBRIDEMENT ABSCESS;  Surgeon: Molli Hazard, MD;  Location: [facility];  Service: Urology;  Laterality: Right;  and scrotal abcesses  . LYMPHADENECTOMY Bilateral 10/06/2012   Procedure: WITH BILATERAL PELVIC LYMPH NODE DISSECTION ;  Surgeon: Molli Hazard, MD;  Location: [facility];  Service: Urology;  Laterality: Bilateral;  . PROSTATE BIOPSY N/A 08/04/2012   Procedure: BIOPSY TRANSRECTAL ULTRASONIC PROSTATE (TUBP);  Surgeon: Molli Hazard, MD;  Location: [facility];  Service: Urology;  Laterality: N/A;  . ROBOT ASSISTED LAPAROSCOPIC RADICAL PROSTATECTOMY N/A 10/06/2012   Procedure: ROBOTIC ASSISTED LAPAROSCOPIC RADICAL PROSTATECTOMY LEVEL 2;  Surgeon: Molli Hazard, MD;  Location: [facility];  Service: Urology;  Laterality: N/A;  . TRANSTHORACIC ECHOCARDIOGRAM  06-06-2009   NORMAL LVF AND LVSF/ EF 55-60%        Home Medications    Prior to Admission medications   Medication Sig Start Date End Date Taking? Authorizing Provider  acetaminophen (TYLENOL) 500 MG tablet  Take 1 tablet (500 mg total) by mouth every 6 (six) hours as needed. 11/22/17   Maurita Havener, Bea Graff, PA-C  acyclovir (ZOVIRAX) 400 MG tablet Take 1 tablet (400 mg total) by mouth 4 (four) times daily. 02/06/15   Deno Etienne, DO  albuterol (PROVENTIL HFA;VENTOLIN HFA) 108 (90 BASE) MCG/ACT inhaler Inhale 2 puffs into the lungs every 6 (six) hours as needed for wheezing.    [provider]  cetirizine (ZYRTEC) 10 MG tablet Take 10 mg by mouth daily.    [provider]  DORZOLAMIDE HCL OP Apply 1 drop to eye 3 (three) times  daily.    [provider]  fluticasone (FLONASE) 50 MCG/ACT nasal spray Place 1 spray into both nostrils daily. 06/12/17   Domenic Moras, PA-C  HYDROcodone-acetaminophen (NORCO/VICODIN) 5-325 MG per tablet Take 1-2 tablets by mouth every 4 (four) hours as needed. Patient not taking: Reported on 02/06/2015 37/1/69   Leighton Ruff, MD  lovastatin (MEVACOR) 20 MG tablet Take 20 mg by mouth every morning.    [provider]  naproxen (NAPROSYN) 500 MG tablet Take 1 tablet (500 mg total) by mouth 2 (two) times daily. 11/22/17   Tasnia Spegal, Bea Graff, PA-C  travoprost, benzalkonium, (TRAVATAN) 0.004 % ophthalmic solution Place 1 drop into both eyes at bedtime.    [provider]    Family History No family history on file.  Social History Social History   Tobacco Use  . Smoking status: Former Smoker    Packs/day: 0.25    Years: 20.00    Pack years: 5.00    Types: Cigarettes    Last attempt to quit: 02/24/2002    Years since quitting: 15.7  . Smokeless tobacco: Never Used  Substance Use Topics  . Alcohol use: No  . Drug use: No    Types: Cocaine, Marijuana, LSD    Comment: last used (approx.  2000)     Allergies   Cephalexin   Review of Systems Review of Systems  Constitutional: Negative for fever.  Musculoskeletal: Positive for arthralgias. Negative for back pain and neck pain.  Neurological: Negative for numbness.     Physical Exam Updated Vital Signs BP 90/70 (BP Location: Right Arm)   Pulse 81   Temp (!) 97.4 F (36.3 C) (Oral)   Resp 18   Ht 6' (1.829 m)   Wt 108 kg   SpO2 97%   BMI 32.28 kg/m   Physical Exam  Constitutional: He appears well-developed and well-nourished. No distress.  HENT:  Head: Normocephalic and atraumatic.  Mouth/Throat: Oropharynx is clear and moist. No oropharyngeal exudate.  Eyes: Pupils are equal, round, and reactive to light. Conjunctivae are normal. Right eye exhibits no discharge. Left eye exhibits no discharge.  No scleral icterus.  Neck: Normal range of motion. Neck supple. No thyromegaly present.  Cardiovascular: Normal rate, regular rhythm, normal heart sounds and intact distal pulses. Exam reveals no gallop and no friction rub.  No murmur heard. Pulmonary/Chest: Effort normal and breath sounds normal. No stridor. No respiratory distress. He has no wheezes. He has no rales.  Abdominal: Soft. Bowel sounds are normal. He exhibits no distension. There is no tenderness. There is no rebound and no guarding.  Musculoskeletal: He exhibits no edema.       Right shoulder: He exhibits tenderness.  Tenderness to the anterior shoulder and deltoid, positive empty can test, pain with resisted external rotation and extension, no pain with internal rotation or flexion, no tenderness on palpation of the  hand, wrist, forearm, elbow, humerus, radial pulse intact, sensation intact, equal bilateral grip strength  Lymphadenopathy:    He has no cervical adenopathy.  Neurological: He is alert. Coordination normal.  Skin: Skin is warm and dry. No rash noted. He is not diaphoretic. No pallor.  Psychiatric: He has a normal mood and affect.  Nursing note and vitals reviewed.    ED Treatments / Results  Labs (all labs ordered are listed, but only abnormal results are displayed) Labs Reviewed - No data to display  EKG None  Radiology Dg Shoulder Right  Result Date: 11/22/2017 CLINICAL DATA:  Pain after moving television 2 weeks ago. EXAM: RIGHT SHOULDER - 2+ VIEW COMPARISON:  None. FINDINGS: There is no evidence of fracture or dislocation. There is no evidence of arthropathy or other focal bone abnormality. Soft tissues are unremarkable. IMPRESSION: Negative. Electronically Signed   By: Dorise Bullion III M.D   On: 11/22/2017 10:37    Procedures Procedures (including critical care time)  Medications Ordered in ED Medications  ketorolac (TORADOL) 30 MG/ML injection 30 mg (has no administration in time range)      Initial Impression / Assessment and Plan / ED Course  I have reviewed the triage vital signs and the nursing notes.  Pertinent labs & imaging results that were available during my care of the patient were reviewed by me and considered in my medical decision making (see chart for details).     Patient with suspected rotator cuff injury.  X-ray of the shoulder is negative.  Patient is neurovascularly intact.  Normal strength.  Will treat with ice, NSAIDs, sling for comfort and referral to orthopedics.  Patient advised to attempt range of motion exercises daily.  Return precautions discussed.  Patient understands and agrees with plan.  Patient vitals stable throughout ED course and discharged in satisfactory condition.  Final Clinical Impressions(s) / ED Diagnoses   Final diagnoses:  Acute pain of right shoulder    ED Discharge Orders         Ordered    naproxen (NAPROSYN) 500 MG tablet  2 times daily     11/22/17 1058    acetaminophen (TYLENOL) 500 MG tablet  Every 6 hours PRN     11/22/17 [address] [place], PA-C 11/22/17 1104    Julianne Rice, MD 11/22/17 1234

## 2017-12-05 ENCOUNTER — Encounter (HOSPITAL_COMMUNITY): Payer: Self-pay | Admitting: Emergency Medicine

## 2017-12-05 ENCOUNTER — Emergency Department (HOSPITAL_COMMUNITY)
Admission: EM | Admit: 2017-12-05 | Discharge: 2017-12-05 | Disposition: A | Discharge status: Home / Self Care | Payer: Medicaid Other | Attending: Emergency Medicine | Admitting: Emergency Medicine

## 2017-12-05 DIAGNOSIS — Z8546 Personal history of malignant neoplasm of prostate: Secondary | ICD-10-CM | POA: Diagnosis not present

## 2017-12-05 DIAGNOSIS — M25511 Pain in right shoulder: Secondary | ICD-10-CM | POA: Insufficient documentation

## 2017-12-05 DIAGNOSIS — Z79899 Other long term (current) drug therapy: Secondary | ICD-10-CM | POA: Diagnosis not present

## 2017-12-05 DIAGNOSIS — Z87891 Personal history of nicotine dependence: Secondary | ICD-10-CM | POA: Diagnosis not present

## 2017-12-05 DIAGNOSIS — E119 Type 2 diabetes mellitus without complications: Secondary | ICD-10-CM | POA: Diagnosis not present

## 2017-12-05 MED ORDER — MELOXICAM 7.5 MG PO TABS: 7.5000 mg | ORAL_TABLET | Freq: Every day | ORAL | 0 refills | Status: AC

## 2017-12-05 NOTE — Discharge Instructions (Addendum)
Discontinue use of naproxen. Take meloxicam as needed as prescribed.   Range of motion exercises as discussed. Contact orthopedics and arrange follow-up.

## 2017-12-05 NOTE — ED Notes (Signed)
Patient verbalizes understanding of discharge instructions. Opportunity for questioning and answers were provided. Armband removed by staff, pt discharged from ED.  

## 2017-12-05 NOTE — ED Notes (Signed)
EDP at bedside  

## 2017-12-05 NOTE — ED Provider Notes (Signed)
[place] EMERGENCY DEPARTMENT Provider Note   CSN: [id number] Arrival date & time: 12/05/17  1558     History   Chief Complaint Chief Complaint  Patient presents with  . Arm Pain    HPI Cristian Welch is a 57 y.o. male.  57 year old male presents with ongoing right shoulder pain.  Patient was seen here last month with the same, pain after lifting something heavy.  Patient is taking naproxen as prescribed however reports he continues to have pain and is not getting better.  Patient did not understand that he was to follow-up with orthopedics for his right shoulder pain.  He denies any new or worsening symptoms, denies any chest pain associated with his shoulder pain. No other complaints or concerns.     Past Medical History:  Diagnosis Date  . Allergic rhinitis   . Arthritis   . At risk for sleep apnea    STOP-BANG= 4    SENT TO PCP 11-28-2013  . Exercise-induced asthma   . GERD (gastroesophageal reflux disease)   . Glaucoma   . History of MRSA infection    05/ 2014--  POSITIVE MRSA CULTURE SCROTAL ABSCESS  . Prostate cancer [facility])    s/p  prostatectomy 08/ 2014  . Skin tag of anus   . Type 2 diabetes mellitus [facility])     Patient Active Problem List   Diagnosis Date Noted  . Hemorrhoids 05/13/2012  . ONYCHOMYCOSIS, TOENAILS 11/06/2009  . 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 ELEVATED BP READING WITHOUT DX HYPERTENSION 11/06/2009  . ELEVATED PROSTATE SPECIFIC ANTIGEN 09/18/2009  . DIABETES MELLITUS, TYPE II 08/06/2009  . GLYCOSURIA 08/06/2009  . VENTRAL HERNIA 05/07/2009  . RECTAL BLEEDING 05/07/2009  . DYSPNEA ON EXERTION 05/07/2009    Past Surgical History:  Procedure Laterality Date  . CIRCUMCISION N/A 08/04/2012   Procedure: CIRCUMCISION ADULT;  Surgeon: Molli Hazard, MD;  Location: [facility];  Service: Urology;  Laterality: N/A;  90 MIN ALSO PROSTATE NERVE BLOCK   . EXCISION OF SKIN TAG N/A 12/01/2013   Procedure:  EXCISION OF ANAL SKIN TAG;  Surgeon: Leighton Ruff, MD;  Location: [place];  Service: General;  Laterality: N/A;  . FOOT SURGERY Left 1994 (approx)  . INGUINAL HERNIA REPAIR Right 2000 (approx)  . IRRIGATION AND DEBRIDEMENT ABSCESS Right 07/07/2012   Procedure: IRRIGATION AND DEBRIDEMENT ABSCESS;  Surgeon: Molli Hazard, MD;  Location: [facility];  Service: Urology;  Laterality: Right;  and scrotal abcesses  . LYMPHADENECTOMY Bilateral 10/06/2012   Procedure: WITH BILATERAL PELVIC LYMPH NODE DISSECTION ;  Surgeon: Molli Hazard, MD;  Location: [facility];  Service: Urology;  Laterality: Bilateral;  . PROSTATE BIOPSY N/A 08/04/2012   Procedure: BIOPSY TRANSRECTAL ULTRASONIC PROSTATE (TUBP);  Surgeon: Molli Hazard, MD;  Location: [facility];  Service: Urology;  Laterality: N/A;  . ROBOT ASSISTED LAPAROSCOPIC RADICAL PROSTATECTOMY N/A 10/06/2012   Procedure: ROBOTIC ASSISTED LAPAROSCOPIC RADICAL PROSTATECTOMY LEVEL 2;  Surgeon: Molli Hazard, MD;  Location[facility];  Service: Urology;  Laterality: N/A;  . TRANSTHORACIC ECHOCARDIOGRAM  06-06-2009   NORMAL LVF AND LVSF/ EF 55-60%        Home Medications    Prior to Admission medications   Medication Sig Start Date End Date Taking? Authorizing Provider  acetaminophen (TYLENOL) 500 MG tablet Take 1 tablet (500 mg total) by mouth every 6 (six) hours as needed. 11/22/17   Frederica Kuster, PA-C  acyclovir (ZOVIRAX)  400 MG tablet Take 1 tablet (400 mg total) by mouth 4 (four) times daily. 02/06/15   Deno Etienne, DO  albuterol (PROVENTIL HFA;VENTOLIN HFA) 108 (90 BASE) MCG/ACT inhaler Inhale 2 puffs into the lungs every 6 (six) hours as needed for wheezing.    [provider]  cetirizine (ZYRTEC) 10 MG tablet Take 10 mg by mouth daily.    [provider]  DORZOLAMIDE HCL OP Apply 1 drop to eye 3 (three) times daily.    [provider]  fluticasone  (FLONASE) 50 MCG/ACT nasal spray Place 1 spray into both nostrils daily. 06/12/17   Domenic Moras, PA-C  HYDROcodone-acetaminophen (NORCO/VICODIN) 5-325 MG per tablet Take 1-2 tablets by mouth every 4 (four) hours as needed. Patient not taking: Reported on 02/06/2015 89/2/11   Leighton Ruff, MD  lovastatin (MEVACOR) 20 MG tablet Take 20 mg by mouth every morning.    [provider]  meloxicam (MOBIC) 7.5 MG tablet Take 1 tablet (7.5 mg total) by mouth daily for 10 days. 12/05/17 12/15/17  Tacy Learn, PA-C  naproxen (NAPROSYN) 500 MG tablet Take 1 tablet (500 mg total) by mouth 2 (two) times daily. 11/22/17   Law, Bea Graff, PA-C  travoprost, benzalkonium, (TRAVATAN) 0.004 % ophthalmic solution Place 1 drop into both eyes at bedtime.    [provider]    Family History History reviewed. No pertinent family history.  Social History Social History   Tobacco Use  . Smoking status: Former Smoker    Packs/day: 0.25    Years: 20.00    Pack years: 5.00    Types: Cigarettes    Last attempt to quit: 02/24/2002    Years since quitting: 15.7  . Smokeless tobacco: Never Used  Substance Use Topics  . Alcohol use: No  . Drug use: No    Types: Cocaine, Marijuana, LSD    Comment: last used (approx.  2000)     Allergies   Cephalexin   Review of Systems Review of Systems  Constitutional: Negative for chills and fever.  Respiratory: Negative for shortness of breath.   Cardiovascular: Negative for chest pain.  Musculoskeletal: Positive for arthralgias. Negative for neck pain and neck stiffness.  Skin: Negative for rash and wound.  Hematological: Negative for adenopathy.  Psychiatric/Behavioral: Negative for confusion.  All other systems reviewed and are negative.    Physical Exam Updated Vital Signs BP (!) 124/100 (BP Location: Right Arm)   Pulse 83   Temp 98.1 F (36.7 C) (Oral)   Resp 17   SpO2 96%   Physical Exam  Constitutional: He is oriented to person,  place, and time. He appears well-developed and well-nourished.  HENT:  Head: Normocephalic and atraumatic.  Neck: Neck supple.  Cardiovascular: Intact distal pulses.  Pulmonary/Chest: Effort normal.  Musculoskeletal: He exhibits tenderness. He exhibits no deformity.       Right shoulder: He exhibits decreased range of motion and tenderness. He exhibits no bony tenderness, no swelling, no effusion, no crepitus, no deformity and normal pulse.       Arms: Anterior and lateral right shoulder tenderness, pain with active and passive ROM. NVI, strong radial pulse.  Neurological: He is alert and oriented to person, place, and time. No sensory deficit.  Skin: Skin is warm and dry.  Psychiatric: He has a normal mood and affect. His behavior is normal.  Nursing note and vitals reviewed.    ED Treatments / Results  Labs (all labs ordered are listed, but only abnormal results  are displayed) Labs Reviewed - No data to display  EKG None  Radiology No results found.  Procedures Procedures (including critical care time)  Medications Ordered in ED Medications - No data to display   Initial Impression / Assessment and Plan / ED Course  I have reviewed the triage vital signs and the nursing notes.  Pertinent labs & imaging results that were available during my care of the patient were reviewed by me and considered in my medical decision making (see chart for details).  Clinical Course as of Dec 06 2114  Sat Dec 05, 2017  2115 Patient with ongoing right shoulder pain, no new injuries.  X-ray obtained at last visit, no acute bony abnormality.  Advised patient to discontinue the naproxen, given prescription for meloxicam to take and advised to follow-up with orthopedics.  Patient understands this time he is to contact orthopedics and schedule a follow-up appointment for further evaluation and management.   [LM]    Clinical Course User Index [LM] Tacy Learn, PA-C   Final Clinical  Impressions(s) / ED Diagnoses   Final diagnoses:  Acute pain of right shoulder    ED Discharge Orders         Ordered    meloxicam (MOBIC) 7.5 MG tablet  Daily     12/05/17 1707           Tacy Learn, PA-C 12/05/17 2116    Hayden Rasmussen, MD 12/06/17 1023

## 2017-12-05 NOTE — ED Triage Notes (Signed)
Pt presents with ongoing R shoulder/arm pain x 3 weeks after lifting/moving a television; pt given Rx for naproxen, states not helping

## 2017-12-11 ENCOUNTER — Encounter (INDEPENDENT_AMBULATORY_CARE_PROVIDER_SITE_OTHER): Payer: Self-pay | Admitting: Family Medicine

## 2017-12-11 ENCOUNTER — Ambulatory Visit (INDEPENDENT_AMBULATORY_CARE_PROVIDER_SITE_OTHER): Payer: Medicaid Other | Admitting: Family Medicine

## 2017-12-11 DIAGNOSIS — M25511 Pain in right shoulder: Secondary | ICD-10-CM | POA: Diagnosis not present

## 2017-12-11 MED ORDER — METHYLPREDNISOLONE ACETATE 40 MG/ML IJ SUSP: 40.0000 mg | Freq: Once | INTRAMUSCULAR | Status: AC

## 2017-12-11 MED ORDER — HYDROCODONE-ACETAMINOPHEN 5-325 MG PO TABS: 1.0000 | ORAL_TABLET | Freq: Four times a day (QID) | ORAL | 0 refills | Status: DC | PRN

## 2017-12-11 MED ORDER — ETODOLAC 400 MG PO TABS: 400.0000 mg | ORAL_TABLET | Freq: Two times a day (BID) | ORAL | 3 refills | Status: DC | PRN

## 2017-12-11 NOTE — Progress Notes (Signed)
Office Visit Note   Patient: Cristian Welch           Date of Birth: 1960/04/24           MRN: [id number] Visit Date: 12/11/2017 Requested by: Nolene Ebbs, MD [address] [place], [place] [address] PCP: Nolene Ebbs, MD  Subjective: Chief Complaint  Patient presents with  . Right Shoulder - Pain    Pain x at least 1 month, after lifting a big tv (he and one other peraon) and carrying up some stairs.  Pain wakes him at night, hurts all the time.    HPI: He is a 57 year old right-hand-dominant male with right shoulder pain.  Symptoms started about a month ago after helping somebody move a heavy TV upstairs.  He began noticing pain on the lateral aspect of his shoulder.  After about a week or 2 it was still hurting so he went to the ER where x-rays were unremarkable.  He was given some medication but his pain has persisted.  He went back to the ER and they referred him here.  No previous problems with his shoulder.              ROS: Otherwise noncontributory  Objective: Vital Signs: There were no vitals taken for this visit.  Physical Exam:  Right shoulder: Full active range of motion, pain at the extremes of range of motion.  No tenderness at the [facility] joint or the long head biceps tendon.  Moderate tenderness in the lateral subacromial space.  Isometric rotator cuff strength is still 5/5 but he has pain with empty can test.'s test is equivocal.  Imaging: None today.  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  Assessment & Plan: 1.  Persistent right shoulder pain, suspect impingement.  Cannot rule out partial rotator cuff tear. -Discussed options with patient, he wants to try a subacromial injection.  If symptoms persist then either physical therapy or MRI scan.   Follow-Up Instructions: No follow-ups on file.       Procedures: Right shoulder subacromial injection: After sterile prep with Betadine, injected 3 cc 1% lidocaine without epinephrine and 40 mg  methylprednisolone from lateral approach into the subacromial space.   PMFS History: Patient Active Problem List   Diagnosis Date Noted  . Hemorrhoids 05/13/2012  . ONYCHOMYCOSIS, TOENAILS 11/06/2009  . 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 ELEVATED BP READING WITHOUT DX HYPERTENSION 11/06/2009  . ELEVATED PROSTATE SPECIFIC ANTIGEN 09/18/2009  . DIABETES MELLITUS, TYPE II 08/06/2009  . GLYCOSURIA 08/06/2009  . VENTRAL HERNIA 05/07/2009  . RECTAL BLEEDING 05/07/2009  . DYSPNEA ON EXERTION 05/07/2009   Past Medical History:  Diagnosis Date  . Allergic rhinitis   . Arthritis   . At risk for sleep apnea    STOP-BANG= 4    SENT TO PCP 11-28-2013  . Exercise-induced asthma   . GERD (gastroesophageal reflux disease)   . Glaucoma   . History of MRSA infection    05/ 2014--  POSITIVE MRSA CULTURE SCROTAL ABSCESS  . Prostate cancer [facility])    s/p  prostatectomy 08/ 2014  . Skin tag of anus   . Type 2 diabetes mellitus ([place])     History reviewed. No pertinent family history.  Past Surgical History:  Procedure Laterality Date  . CIRCUMCISION N/A 08/04/2012   Procedure: CIRCUMCISION ADULT;  Surgeon: Molli Hazard, MD;  Location: [facility];  Service: Urology;  Laterality: N/A;  90 MIN ALSO PROSTATE NERVE BLOCK   . EXCISION  OF SKIN TAG N/A 12/01/2013   Procedure: EXCISION OF ANAL SKIN TAG;  Surgeon: Leighton Ruff, MD;  Location: [place];  Service: General;  Laterality: N/A;  . FOOT SURGERY Left 1994 (approx)  . INGUINAL HERNIA REPAIR Right 2000 (approx)  . IRRIGATION AND DEBRIDEMENT ABSCESS Right 07/07/2012   Procedure: IRRIGATION AND DEBRIDEMENT ABSCESS;  Surgeon: Molli Hazard, MD;  Location: [facility];  Service: Urology;  Laterality: Right;  and scrotal abcesses  . LYMPHADENECTOMY Bilateral 10/06/2012   Procedure: WITH BILATERAL PELVIC LYMPH NODE DISSECTION ;  Surgeon: Molli Hazard, MD;  [facility];  Service:  Urology;  Laterality: Bilateral;  . PROSTATE BIOPSY N/A 08/04/2012   Procedure: BIOPSY TRANSRECTAL ULTRASONIC PROSTATE (TUBP);  Surgeon: Molli Hazard, MD;  Location: [facility];  Service: Urology;  Laterality: N/A;  . ROBOT ASSISTED LAPAROSCOPIC RADICAL PROSTATECTOMY N/A 10/06/2012   Procedure: ROBOTIC ASSISTED LAPAROSCOPIC RADICAL PROSTATECTOMY LEVEL 2;  Surgeon: Molli Hazard, MD;  Location[facility];  Service: Urology;  Laterality: N/A;  . TRANSTHORACIC ECHOCARDIOGRAM  06-06-2009   NORMAL LVF AND LVSF/ EF 55-60%   Social History   Occupational History  . Not on file  Tobacco Use  . Smoking status: Former Smoker    Packs/day: 0.25    Years: 20.00    Pack years: 5.00    Types: Cigarettes    Last attempt to quit: 02/24/2002    Years since quitting: 15.8  . Smokeless tobacco: Never Used  Substance and Sexual Activity  . Alcohol use: No  . Drug use: No    Types: Cocaine, Marijuana, LSD    Comment: last used (approx.  2000)  . Sexual activity: Not on file

## 2018-01-16 ENCOUNTER — Emergency Department (HOSPITAL_COMMUNITY): Payer: Medicaid Other

## 2018-01-16 ENCOUNTER — Emergency Department (HOSPITAL_COMMUNITY)
Admission: EM | Admit: 2018-01-16 | Discharge: 2018-01-16 | Disposition: A | Discharge status: Home / Self Care | Payer: Medicaid Other | Attending: Emergency Medicine | Admitting: Emergency Medicine

## 2018-01-16 ENCOUNTER — Other Ambulatory Visit: Payer: Self-pay

## 2018-01-16 DIAGNOSIS — Z7984 Long term (current) use of oral hypoglycemic drugs: Secondary | ICD-10-CM | POA: Insufficient documentation

## 2018-01-16 DIAGNOSIS — Z87891 Personal history of nicotine dependence: Secondary | ICD-10-CM | POA: Insufficient documentation

## 2018-01-16 DIAGNOSIS — R0789 Other chest pain: Secondary | ICD-10-CM | POA: Diagnosis not present

## 2018-01-16 DIAGNOSIS — Y939 Activity, unspecified: Secondary | ICD-10-CM | POA: Insufficient documentation

## 2018-01-16 DIAGNOSIS — M25512 Pain in left shoulder: Secondary | ICD-10-CM | POA: Insufficient documentation

## 2018-01-16 DIAGNOSIS — E119 Type 2 diabetes mellitus without complications: Secondary | ICD-10-CM | POA: Diagnosis not present

## 2018-01-16 DIAGNOSIS — Y929 Unspecified place or not applicable: Secondary | ICD-10-CM | POA: Insufficient documentation

## 2018-01-16 DIAGNOSIS — Z79899 Other long term (current) drug therapy: Secondary | ICD-10-CM | POA: Diagnosis not present

## 2018-01-16 DIAGNOSIS — S161XXA Strain of muscle, fascia and tendon at neck level, initial encounter: Secondary | ICD-10-CM

## 2018-01-16 DIAGNOSIS — Z8546 Personal history of malignant neoplasm of prostate: Secondary | ICD-10-CM | POA: Diagnosis not present

## 2018-01-16 DIAGNOSIS — M542 Cervicalgia: Secondary | ICD-10-CM | POA: Diagnosis present

## 2018-01-16 DIAGNOSIS — Y999 Unspecified external cause status: Secondary | ICD-10-CM | POA: Insufficient documentation

## 2018-01-16 MED ORDER — NAPROXEN 500 MG PO TABS: 500.0000 mg | ORAL_TABLET | Freq: Two times a day (BID) | ORAL | 0 refills | Status: DC

## 2018-01-16 MED ORDER — CYCLOBENZAPRINE HCL 10 MG PO TABS: 10.0000 mg | ORAL_TABLET | Freq: Two times a day (BID) | ORAL | 0 refills | Status: DC | PRN

## 2018-01-16 MED ORDER — IBUPROFEN 200 MG PO TABS
600.0000 mg | ORAL_TABLET | Freq: Once | ORAL | Status: AC
  Administered 2018-01-16: 600 mg via ORAL
  Filled 2018-01-16: qty 3
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
## 2018-01-16 NOTE — Discharge Instructions (Addendum)
Follow up with your doctor or return here if symptoms persist. Do not drive while taking the muscle relaxer as it will make you sleepy.

## 2018-01-16 NOTE — ED Notes (Signed)
Bed: WTR6 Expected date:  Expected time:  Means of arrival:  Comments: 
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
## 2018-01-23 ENCOUNTER — Other Ambulatory Visit: Payer: Self-pay

## 2018-01-23 ENCOUNTER — Emergency Department (HOSPITAL_COMMUNITY): Payer: Medicaid Other

## 2018-01-23 ENCOUNTER — Encounter (HOSPITAL_COMMUNITY): Payer: Self-pay

## 2018-01-23 ENCOUNTER — Emergency Department (HOSPITAL_COMMUNITY)
Admission: EM | Admit: 2018-01-23 | Discharge: 2018-01-23 | Disposition: A | Discharge status: Home / Self Care | Payer: Medicaid Other | Attending: Emergency Medicine | Admitting: Emergency Medicine

## 2018-01-23 DIAGNOSIS — M791 Myalgia, unspecified site: Secondary | ICD-10-CM | POA: Insufficient documentation

## 2018-01-23 DIAGNOSIS — Y998 Other external cause status: Secondary | ICD-10-CM | POA: Diagnosis not present

## 2018-01-23 DIAGNOSIS — Z87891 Personal history of nicotine dependence: Secondary | ICD-10-CM | POA: Diagnosis not present

## 2018-01-23 DIAGNOSIS — S4991XA Unspecified injury of right shoulder and upper arm, initial encounter: Secondary | ICD-10-CM | POA: Diagnosis present

## 2018-01-23 DIAGNOSIS — E119 Type 2 diabetes mellitus without complications: Secondary | ICD-10-CM | POA: Diagnosis not present

## 2018-01-23 DIAGNOSIS — Y93I9 Activity, other involving external motion: Secondary | ICD-10-CM | POA: Diagnosis not present

## 2018-01-23 DIAGNOSIS — Y92410 Unspecified street and highway as the place of occurrence of the external cause: Secondary | ICD-10-CM | POA: Diagnosis not present

## 2018-01-23 MED ORDER — NAPROXEN 500 MG PO TABS
500.0000 mg | ORAL_TABLET | Freq: Once | ORAL | Status: AC
  Administered 2018-01-23: 500 mg via ORAL
  Filled 2018-01-23: qty 1

## 2018-01-23 MED ORDER — METHOCARBAMOL 500 MG PO TABS: 500.0000 mg | ORAL_TABLET | Freq: Three times a day (TID) | ORAL | 0 refills | Status: AC | PRN

## 2018-01-23 MED ORDER — METHOCARBAMOL 500 MG PO TABS: 500.0000 mg | ORAL_TABLET | Freq: Three times a day (TID) | ORAL | 0 refills | Status: DC | PRN
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
## 2018-02-10 ENCOUNTER — Emergency Department (HOSPITAL_COMMUNITY)
Admission: EM | Admit: 2018-02-10 | Discharge: 2018-02-10 | Disposition: A | Discharge status: Home / Self Care | Payer: Medicaid Other | Attending: Emergency Medicine | Admitting: Emergency Medicine

## 2018-02-10 ENCOUNTER — Other Ambulatory Visit: Payer: Self-pay

## 2018-02-10 ENCOUNTER — Encounter (HOSPITAL_COMMUNITY): Payer: Self-pay

## 2018-02-10 DIAGNOSIS — E119 Type 2 diabetes mellitus without complications: Secondary | ICD-10-CM | POA: Insufficient documentation

## 2018-02-10 DIAGNOSIS — M25511 Pain in right shoulder: Secondary | ICD-10-CM

## 2018-02-10 DIAGNOSIS — Z8546 Personal history of malignant neoplasm of prostate: Secondary | ICD-10-CM | POA: Diagnosis not present

## 2018-02-10 DIAGNOSIS — G8929 Other chronic pain: Secondary | ICD-10-CM

## 2018-02-10 DIAGNOSIS — Z87891 Personal history of nicotine dependence: Secondary | ICD-10-CM | POA: Diagnosis not present

## 2018-02-10 DIAGNOSIS — Z79899 Other long term (current) drug therapy: Secondary | ICD-10-CM | POA: Insufficient documentation

## 2018-02-10 DIAGNOSIS — Z7984 Long term (current) use of oral hypoglycemic drugs: Secondary | ICD-10-CM | POA: Diagnosis not present

## 2018-02-10 DIAGNOSIS — G8921 Chronic pain due to trauma: Secondary | ICD-10-CM | POA: Insufficient documentation

## 2018-02-10 MED ORDER — ACETAMINOPHEN 500 MG PO TABS: 1000.0000 mg | ORAL_TABLET | Freq: Four times a day (QID) | ORAL | 0 refills | Status: AC | PRN

## 2018-02-10 MED ORDER — CYCLOBENZAPRINE HCL 10 MG PO TABS
10.0000 mg | ORAL_TABLET | Freq: Once | ORAL | Status: AC
  Administered 2018-02-10: 10 mg via ORAL
  Filled 2018-02-10: qty 1

## 2018-02-10 MED ORDER — CYCLOBENZAPRINE HCL 10 MG PO TABS: 10.0000 mg | ORAL_TABLET | Freq: Two times a day (BID) | ORAL | 0 refills | Status: AC | PRN

## 2018-02-10 MED ORDER — ACETAMINOPHEN 500 MG PO TABS
1000.0000 mg | ORAL_TABLET | Freq: Once | ORAL | Status: AC
  Administered 2018-02-10: 1000 mg via ORAL
  Filled 2018-02-10: qty 2
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
## 2018-02-10 NOTE — Discharge Instructions (Addendum)
Thank you for allowing me to care for you today in the Emergency Department.   Please follow up with orthopaedics Raliegh Ip for your shoulder pain.   Take 1000 mg of Tylenol once every 8 hours for pain. Do not take more than 4000 mg of Tylenol in a 24-hour period. You can take one tablet of Flexeril 2 times days. Your first dose was given here. Do not work, drive, drink alcohol, or take other medications that may make you sleepy until you know how this medication impacts you.  Apply an ice pack for 15 to 20 minutes up to 3-4 times a day to help with pain and swelling.  Start to perform exercises of your right shoulder as your pain allows.  Exercises are attached.  Return if you have another fall or injury, if you develop chest pain, numbness, shortness of breath, or if the joint gets red, hot to the touch, or very swollen.
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
## 2018-02-10 NOTE — ED Notes (Signed)
Bed: WTR7 Expected date:  Expected time:  Means of arrival:  Comments: 

## 2018-02-12 ENCOUNTER — Other Ambulatory Visit (INDEPENDENT_AMBULATORY_CARE_PROVIDER_SITE_OTHER): Payer: Self-pay | Admitting: Family Medicine

## 2018-02-12 ENCOUNTER — Telehealth (INDEPENDENT_AMBULATORY_CARE_PROVIDER_SITE_OTHER): Payer: Self-pay | Admitting: Family Medicine

## 2018-02-12 DIAGNOSIS — M25511 Pain in right shoulder: Secondary | ICD-10-CM

## 2018-02-12 NOTE — Telephone Encounter (Signed)
Orders placed.

## 2018-02-12 NOTE — Telephone Encounter (Signed)
[facility]T

## 2018-02-12 NOTE — Telephone Encounter (Signed)
Advised patient order has been placed to [facility] - they will contact the patient to set up an appointment.

## 2018-02-12 NOTE — Telephone Encounter (Signed)
Patient would like to try physical therpay.

## 2018-02-12 NOTE — Progress Notes (Signed)
Ab re

## 2018-02-16 ENCOUNTER — Other Ambulatory Visit: Payer: Self-pay

## 2018-02-16 ENCOUNTER — Ambulatory Visit: Payer: No Typology Code available for payment source | Attending: Family Medicine

## 2018-02-16 DIAGNOSIS — M25511 Pain in right shoulder: Secondary | ICD-10-CM | POA: Diagnosis not present

## 2018-02-16 DIAGNOSIS — G8929 Other chronic pain: Secondary | ICD-10-CM | POA: Insufficient documentation

## 2018-02-16 DIAGNOSIS — M25611 Stiffness of right shoulder, not elsewhere classified: Secondary | ICD-10-CM | POA: Diagnosis present

## 2018-02-16 DIAGNOSIS — M62838 Other muscle spasm: Secondary | ICD-10-CM | POA: Insufficient documentation
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
## 2018-02-16 NOTE — Patient Instructions (Signed)
Strengthening: Isometric Flexion  Using wall for resistance, press right fist into ball using light pressure. Hold _5-10___ seconds. Repeat __5-10__ times per set. Do __1__ sets per session. Do _1-2__ sessions per day.  SHOULDER: Abduction (Isometric)  Use wall as resistance. Press arm against pillow. Keep elbow straight. Hold _5-10__ seconds. __5-10_ reps per set, _1-2__ sets per day, _6__ days per week  Extension (Isometric)  Place left bent elbow and back of arm against wall. Press elbow against wall. Hold _30___ seconds. Repeat __5-10__ times. Do _1-2___ sessions per day.  Internal Rotation (Isometric)  Place palm of right fist against door frame, with elbow bent. Press fist against door frame. Hold _5-10___ seconds. Repeat ___5-10_ times. Do _1-2___ sessions per day.  External Rotation (Isometric)  Place back of left fist against door frame, with elbow bent. Press fist against door frame. Hold _5-10___ seconds. Repeat __5-10__ times. Do __1-2__ sessions per day.  Copyright  VHI. All rights reserved.   Posterior Capsule-Adduction Stretch (Active)    Stand or sit. Reach one arm across chest. Hold _30__ seconds.  Repeat _2__ times per session. Do _3__ sessions per day.  Copyright  VHI. All rights reserved.

## 2018-03-01 ENCOUNTER — Ambulatory Visit: Payer: No Typology Code available for payment source | Attending: Family Medicine

## 2018-03-01 DIAGNOSIS — M25611 Stiffness of right shoulder, not elsewhere classified: Secondary | ICD-10-CM | POA: Diagnosis present

## 2018-03-01 DIAGNOSIS — M25511 Pain in right shoulder: Secondary | ICD-10-CM | POA: Diagnosis not present

## 2018-03-01 DIAGNOSIS — M62838 Other muscle spasm: Secondary | ICD-10-CM | POA: Insufficient documentation

## 2018-03-01 DIAGNOSIS — G8929 Other chronic pain: Secondary | ICD-10-CM | POA: Insufficient documentation

## 2018-03-01 NOTE — Therapy (Signed)
[place], [place], [address] Phone: [phone]   Fax:  [phone]  Physical Therapy Treatment  Patient Details  Name: Cristian Welch MRN: [id number] Date of Birth: 02-Mar-1960 Referring Provider (PT): Eunice Blase , MD   Encounter Date: 03/01/2018  PT End of Session - 03/01/18 1154    Visit Number  2    Number of Visits  12    Date for PT Re-Evaluation  04/02/18    Authorization Type  MCD    Authorization Time Period  thru 03/18/18    Authorization - Visit Number  1    Authorization - Number of Visits  3    PT Start Time  8309    PT Stop Time  1230    PT Time Calculation (min)  45 min    Activity Tolerance  Patient tolerated treatment well    Behavior During Therapy  [facility] for tasks assessed/performed       Past Medical History:  Diagnosis Date  . Allergic rhinitis   . Arthritis   . At risk for sleep apnea    STOP-BANG= 4    SENT TO PCP 11-28-2013  . Exercise-induced asthma   . GERD (gastroesophageal reflux disease)   . Glaucoma   . History of MRSA infection    05/ 2014--  POSITIVE MRSA CULTURE SCROTAL ABSCESS  . Prostate cancer [facility])    s/p  prostatectomy 08/ 2014  . Skin tag of anus   . Type 2 diabetes mellitus ([place])     Past Surgical History:  Procedure Laterality Date  . CIRCUMCISION N/A 08/04/2012   Procedure: CIRCUMCISION ADULT;  Surgeon: Molli Hazard, MD;  Location: [facility];  Service: Urology;  Laterality: N/A;  90 MIN ALSO PROSTATE NERVE BLOCK   . EXCISION OF SKIN TAG N/A 12/01/2013   Procedure: EXCISION OF ANAL SKIN TAG;  Surgeon: Leighton Ruff, MD;  Location: [place];  Service: General;  Laterality: N/A;  . FOOT SURGERY Left 1994 (approx)  . INGUINAL HERNIA REPAIR Right 2000 (approx)  . IRRIGATION AND DEBRIDEMENT ABSCESS Right 07/07/2012   Procedure: IRRIGATION AND DEBRIDEMENT ABSCESS;  Surgeon: Molli Hazard, MD;  Location:  [facility];  Service: Urology;  Laterality: Right;  and scrotal abcesses  . LYMPHADENECTOMY Bilateral 10/06/2012   Procedure: WITH BILATERAL PELVIC LYMPH NODE DISSECTION ;  Surgeon: Molli Hazard, MD;  [facility];  Service: Urology;  Laterality: Bilateral;  . PROSTATE BIOPSY N/A 08/04/2012   Procedure: BIOPSY TRANSRECTAL ULTRASONIC PROSTATE (TUBP);  Surgeon: Molli Hazard, MD;  Location: [facility];  Service: Urology;  Laterality: N/A;  . ROBOT ASSISTED LAPAROSCOPIC RADICAL PROSTATECTOMY N/A 10/06/2012   Procedure: ROBOTIC ASSISTED LAPAROSCOPIC RADICAL PROSTATECTOMY LEVEL 2;  Surgeon: Molli Hazard, MD;  [facility];  Service: Urology;  Laterality: N/A;  . TRANSTHORACIC ECHOCARDIOGRAM  06-06-2009   NORMAL LVF AND LVSF/ EF 55-60%    There were no vitals filed for this visit.  Subjective Assessment - 03/01/18 1153    Subjective  Shoulder doing great  but pain continues.   Pain more at night with sleeping on shoulder.   No pain now.   some times in day with using arm,   Some times has some pain on LT     Currently in Pain?  No/denies  [place] Adult PT Treatment/Exercise - 03/01/18 0001      Shoulder Exercises: Seated   Other Seated Exercises  scapula retraction x 10         Shoulder Exercises: Standing   Extension  Both;15 reps    Theraband Level (Shoulder Extension)  Level 2 (Red)    Row  Both;15 reps    Theraband Level (Shoulder Row)  Level 2 (Red)      Shoulder Exercises: Pulleys   Flexion  2 minutes      Shoulder Exercises: ROM/Strengthening   UBE (Upper Arm Bike)  L1     4 min forward      Modalities   Modalities  Iontophoresis      Iontophoresis   Type of Iontophoresis  Dexamethasone    Location  RT shoulder    Dose  1cc    Time  4-6 hours      Manual Therapy   Manual Therapy  Taping    Kinesiotex  Inhibit Muscle      Kinesiotix   Inhibit Muscle   single tape over  trap and supraspnatus              PT Education - 03/01/18 1221    Education Details  REmoval of ionto if skin irritated other wise 4-6 hours to remove, band exer    Person(s) Educated  Patient    Methods  Explanation;Demonstration;Verbal cues;Handout    Comprehension  Returned demonstration;Verbalized understanding       PT Short Term Goals - 02/16/18 1135      PT SHORT TERM GOAL #1   Title   will be independent with initial HEP    Baseline  No exer program    Time  3    Period  Weeks    Status  New      PT SHORT TERM GOAL #2   Title  He will have 10-20% decr pain with use of RT arm    Baseline     pain at eval    Time  3    Period  Weeks    Status  New        PT Long Term Goals - 02/16/18 1137      PT LONG TERM GOAL #1   Title  He will be independent with all HEP issued    Baseline  He is independent with initial hEP    Time  6    Period  Weeks    Status  New      PT LONG TERM GOAL #2   Title  He will report pain as intermittant and decr 60% or more    Baseline  pain decr 20%    Time  6    Period  Weeks    Status  New      PT LONG TERM GOAL #3   Title  He will have painfree use RT arm with self care    Baseline  unable to reach overhead without severe pain.    Time  6    Period  Weeks    Status  New      PT LONG TERM GOAL #4   Title  He will be able to lift trash and normal items at home with  min to no pain    Baseline  cannot lift items due to pain    Time  6    Period  Weeks    Status  New  Plan - 03/01/18 1154    PT Treatment/Interventions  Passive range of motion;Therapeutic exercise;Patient/family education;Manual techniques;Cryotherapy;Moist Heat;Iontophoresis 4mg /ml Dexamethasone;Therapeutic activities;Taping    PT Home Exercise Plan  isometrics , cross chest stretch    Consulted and Agree with Plan of Care  Patient       Patient will benefit from skilled therapeutic intervention in order to improve the following  deficits and impairments:  Pain, Impaired UE functional use, Decreased activity tolerance  Visit Diagnosis: Chronic right shoulder pain  Stiffness of right shoulder, not elsewhere classified  Other muscle spasm     Problem List Patient Active Problem List   Diagnosis Date Noted  . Hemorrhoids 05/13/2012  . ONYCHOMYCOSIS, TOENAILS 11/06/2009  . 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 ELEVATED BP READING WITHOUT DX HYPERTENSION 11/06/2009  . ELEVATED PROSTATE SPECIFIC ANTIGEN 09/18/2009  . DIABETES MELLITUS, TYPE II 08/06/2009  . GLYCOSURIA 08/06/2009  . VENTRAL HERNIA 05/07/2009  . RECTAL BLEEDING 05/07/2009  . DYSPNEA ON EXERTION 05/07/2009    Darrel Hoover  PT 03/01/2018, 12:25 PM  [facility] [address] [place], [place], [address] Phone: [phone]   Fax:  [phone]  Name: Cristian Welch MRN: [id number] Date of Birth: 01-14-1961

## 2018-03-01 NOTE — Patient Instructions (Addendum)

## 2018-03-08 ENCOUNTER — Ambulatory Visit: Payer: No Typology Code available for payment source

## 2018-03-08 DIAGNOSIS — M25611 Stiffness of right shoulder, not elsewhere classified: Secondary | ICD-10-CM

## 2018-03-08 DIAGNOSIS — G8929 Other chronic pain: Secondary | ICD-10-CM

## 2018-03-08 DIAGNOSIS — M25511 Pain in right shoulder: Principal | ICD-10-CM

## 2018-03-08 DIAGNOSIS — M62838 Other muscle spasm: Secondary | ICD-10-CM

## 2018-03-08 NOTE — Therapy (Signed)
[place], [place], [address] Phone: [phone]   Fax:  [phone]  Physical Therapy Treatment  Patient Details  Name: Cristian Welch MRN: [id number] Date of Birth: 04-Feb-1961 Referring Provider (PT): Eunice Blase , MD   Encounter Date: 03/08/2018  PT End of Session - 03/08/18 1152    Visit Number  3    Number of Visits  12    Date for PT Re-Evaluation  04/02/18    Authorization Type  MCD    Authorization Time Period  thru 03/18/18    Authorization - Visit Number  2    Authorization - Number of Visits  3    PT Start Time  2536    PT Stop Time  1225    PT Time Calculation (min)  40 min    Activity Tolerance  Patient tolerated treatment well    Behavior During Therapy  [facility] for tasks assessed/performed       Past Medical History:  Diagnosis Date  . Allergic rhinitis   . Arthritis   . At risk for sleep apnea    STOP-BANG= 4    SENT TO PCP 11-28-2013  . Exercise-induced asthma   . GERD (gastroesophageal reflux disease)   . Glaucoma   . History of MRSA infection    05/ 2014--  POSITIVE MRSA CULTURE SCROTAL ABSCESS  . Prostate cancer [facility])    s/p  prostatectomy 08/ 2014  . Skin tag of anus   . Type 2 diabetes mellitus ([place])     Past Surgical History:  Procedure Laterality Date  . CIRCUMCISION N/A 08/04/2012   Procedure: CIRCUMCISION ADULT;  Surgeon: Molli Hazard, MD;  Location: [facility];  Service: Urology;  Laterality: N/A;  90 MIN ALSO PROSTATE NERVE BLOCK   . EXCISION OF SKIN TAG N/A 12/01/2013   Procedure: EXCISION OF ANAL SKIN TAG;  Surgeon: Leighton Ruff, MD;  Location: [place];  Service: General;  Laterality: N/A;  . FOOT SURGERY Left 1994 (approx)  . INGUINAL HERNIA REPAIR Right 2000 (approx)  . IRRIGATION AND DEBRIDEMENT ABSCESS Right 07/07/2012   Procedure: IRRIGATION AND DEBRIDEMENT ABSCESS;  Surgeon: Molli Hazard, MD;  Location:  [facility];  Service: Urology;  Laterality: Right;  and scrotal abcesses  . LYMPHADENECTOMY Bilateral 10/06/2012   Procedure: WITH BILATERAL PELVIC LYMPH NODE DISSECTION ;  Surgeon: Molli Hazard, MD;  [facility];  Service: Urology;  Laterality: Bilateral;  . PROSTATE BIOPSY N/A 08/04/2012   Procedure: BIOPSY TRANSRECTAL ULTRASONIC PROSTATE (TUBP);  Surgeon: Molli Hazard, MD;  Location: [facility];  Service: Urology;  Laterality: N/A;  . ROBOT ASSISTED LAPAROSCOPIC RADICAL PROSTATECTOMY N/A 10/06/2012   Procedure: ROBOTIC ASSISTED LAPAROSCOPIC RADICAL PROSTATECTOMY LEVEL 2;  Surgeon: Molli Hazard, MD;  [facility];  Service: Urology;  Laterality: N/A;  . TRANSTHORACIC ECHOCARDIOGRAM  06-06-2009   NORMAL LVF AND LVSF/ EF 55-60%    There were no vitals filed for this visit.  Subjective Assessment - 03/08/18 1151    Subjective  Exercises and patch helping . No pain now    Currently in Pain?  No/denies                       [facility] Adult PT Treatment/Exercise - 03/08/18 0001      Therapeutic Activites    Therapeutic Activities  Lifting    Lifting  lifting and carry both and singel  arm with max lift/carry per pt was 25 pounds.         Shoulder Exercises: Standing   External Rotation  Right;15 reps    Theraband Level (Shoulder External Rotation)  Level 2 (Red)    Internal Rotation  Right;15 reps    Theraband Level (Shoulder Internal Rotation)  Level 2 (Red)    Extension  Both;15 reps    Theraband Level (Shoulder Extension)  Level 3 (Green)    Row  Both;15 reps    Theraband Level (Shoulder Row)  Level 3 (Green)      Shoulder Exercises: ROM/Strengthening   UBE (Upper Arm Bike)  L2 3 min forward , 3 min back             PT Education - 03/08/18 1219    Education Details  cautioned to stop and rest if shoulder is sore with exercise/lift/carry and  to not overdo. suggested use of gallon container to lift  for strengt and car use flat of water if no pain for lift ing and carrying.    Issued geen band and to do [place] anlong with row and extension for HEP 10-15 reps, 1x/day    Person(s) Educated  Patient    Methods  Explanation;Demonstration;Tactile cues;Verbal cues;Handout    Comprehension  Returned demonstration;Verbalized understanding       PT Short Term Goals - 03/08/18 1153      PT SHORT TERM GOAL #1   Title   will be independent with initial HEP    Status  Achieved      PT SHORT TERM GOAL #2   Title  He will have 10-20% decr pain with use of RT arm    Status  Achieved        PT Long Term Goals - 03/08/18 1223      PT LONG TERM GOAL #1   Title  He will be independent with all HEP issued    Baseline  He is independent with initial hEP    Status  On-going      PT LONG TERM GOAL #2   Title  He will report pain as intermittant and decr 60% or more    Status  Achieved      PT LONG TERM GOAL #3   Title  He will have painfree use RT arm with self care    Baseline  appears to be progressing to this goal    Status  On-going      PT LONG TERM GOAL #4   Title  He will be able to lift trash and normal items at home with  min to no pain    Baseline  lifting items but will see in a week if this is consisitent    Status  On-going            Plan - 03/08/18 1152    Clinical Impression Statement  no soreness  with exercise. No need for  ionto.  He is pleased with progress .  May discharge with HEP next session if no pain.    PT Treatment/Interventions  Passive range of motion;Therapeutic exercise;Patient/family education;Manual techniques;Cryotherapy;Moist Heat;Iontophoresis 4mg /ml Dexamethasone;Therapeutic activities;Taping    PT Next Visit Plan  review and progress HEP , modalities and manual     PT Home Exercise Plan  isometrics , cross chest stretch, band row, extension , [place], lift and carry for home    Consulted and Agree with Plan of Care  Patient  Patient  will benefit from skilled therapeutic intervention in order to improve the following deficits and impairments:  Pain, Impaired UE functional use, Decreased activity tolerance  Visit Diagnosis: Chronic right shoulder pain  Stiffness of right shoulder, not elsewhere classified  Other muscle spasm     Problem List Patient Active Problem List   Diagnosis Date Noted  . Hemorrhoids 05/13/2012  . ONYCHOMYCOSIS, TOENAILS 11/06/2009  . 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 ELEVATED BP READING WITHOUT DX HYPERTENSION 11/06/2009  . ELEVATED PROSTATE SPECIFIC ANTIGEN 09/18/2009  . DIABETES MELLITUS, TYPE II 08/06/2009  . GLYCOSURIA 08/06/2009  . VENTRAL HERNIA 05/07/2009  . RECTAL BLEEDING 05/07/2009  . DYSPNEA ON EXERTION 05/07/2009    Darrel Hoover  PT 03/08/2018, 12:25 PM  [facility] [address] [place], [place], [address] Phone: [phone]   Fax:  [phone]  Name: Jivan Symanski MRN: [id number] Date of Birth: 04/26/1960

## 2018-03-15 ENCOUNTER — Ambulatory Visit: Payer: No Typology Code available for payment source | Admitting: Physical Therapy

## 2018-03-15 ENCOUNTER — Other Ambulatory Visit: Payer: Self-pay

## 2018-03-15 ENCOUNTER — Encounter: Payer: Self-pay | Admitting: Physical Therapy

## 2018-03-15 DIAGNOSIS — M25511 Pain in right shoulder: Principal | ICD-10-CM

## 2018-03-15 DIAGNOSIS — M25611 Stiffness of right shoulder, not elsewhere classified: Secondary | ICD-10-CM

## 2018-03-15 DIAGNOSIS — G8929 Other chronic pain: Secondary | ICD-10-CM

## 2018-03-15 DIAGNOSIS — M62838 Other muscle spasm: Secondary | ICD-10-CM

## 2018-03-15 NOTE — Therapy (Signed)
[place], [place], [address] Phone: [phone]   Fax:  [phone]  Physical Therapy Treatment/Discharge Note  Patient Details  Name: Cristian Welch MRN: [id number] Date of Birth: 08-15-1960 Referring Provider (PT): Eunice Blase , MD   Encounter Date: 03/15/2018  PT End of Session - 03/15/18 1104    Visit Number  4    Number of Visits  12    Date for PT Re-Evaluation  04/02/18    Authorization Type  MCD    Authorization Time Period  thru 03/18/18    PT Start Time  1102    PT Stop Time  1143    PT Time Calculation (min)  41 min    Activity Tolerance  Patient tolerated treatment well    Behavior During Therapy  [facility] for tasks assessed/performed       Past Medical History:  Diagnosis Date  . Allergic rhinitis   . Arthritis   . At risk for sleep apnea    STOP-BANG= 4    SENT TO PCP 11-28-2013  . Exercise-induced asthma   . GERD (gastroesophageal reflux disease)   . Glaucoma   . History of MRSA infection    05/ 2014--  POSITIVE MRSA CULTURE SCROTAL ABSCESS  . Prostate cancer [facility])    s/p  prostatectomy 08/ 2014  . Skin tag of anus   . Type 2 diabetes mellitus ([place])     Past Surgical History:  Procedure Laterality Date  . CIRCUMCISION N/A 08/04/2012   Procedure: CIRCUMCISION ADULT;  Surgeon: Molli Hazard, MD;  Location: [facility];  Service: Urology;  Laterality: N/A;  90 MIN ALSO PROSTATE NERVE BLOCK   . EXCISION OF SKIN TAG N/A 12/01/2013   Procedure: EXCISION OF ANAL SKIN TAG;  Surgeon: Leighton Ruff, MD;  Location: [place];  Service: General;  Laterality: N/A;  . FOOT SURGERY Left 1994 (approx)  . INGUINAL HERNIA REPAIR Right 2000 (approx)  . IRRIGATION AND DEBRIDEMENT ABSCESS Right 07/07/2012   Procedure: IRRIGATION AND DEBRIDEMENT ABSCESS;  Surgeon: Molli Hazard, MD;  Location: [facility];  Service: Urology;  Laterality:  Right;  and scrotal abcesses  . LYMPHADENECTOMY Bilateral 10/06/2012   Procedure: WITH BILATERAL PELVIC LYMPH NODE DISSECTION ;  Surgeon: Molli Hazard, MD;  Location: [facility];  Service: Urology;  Laterality: Bilateral;  . PROSTATE BIOPSY N/A 08/04/2012   Procedure: BIOPSY TRANSRECTAL ULTRASONIC PROSTATE (TUBP);  Surgeon: Molli Hazard, MD;  Location: [facility];  Service: Urology;  Laterality: N/A;  . ROBOT ASSISTED LAPAROSCOPIC RADICAL PROSTATECTOMY N/A 10/06/2012   Procedure: ROBOTIC ASSISTED LAPAROSCOPIC RADICAL PROSTATECTOMY LEVEL 2;  Surgeon: Molli Hazard, MD;  Location: [facility];  Service: Urology;  Laterality: N/A;  . TRANSTHORACIC ECHOCARDIOGRAM  06-06-2009   NORMAL LVF AND LVSF/ EF 55-60%    There were no vitals filed for this visit.  Subjective Assessment - 03/15/18 1117    Subjective   I have no pain. No pain lifting 25 # above head.   I am ready to go and discharge!    Limitations  Lifting    Diagnostic tests  Xray negative    Patient Stated Goals  He is not sure and agereed when asked by MD    Currently in Pain?  No/denies    Pain Score  0-No pain    Pain Location  Shoulder    Pain Orientation  Right;Posterior    Pain Descriptors /  Indicators  Stabbing    Pain Type  Chronic pain    Pain Onset  More than a month ago         [facility] PT Assessment - 03/15/18 1119      Assessment   Medical Diagnosis  Chronic RT shoulder pain    Referring Provider (PT)  Eunice Blase , MD      ROM / Strength   AROM / PROM / Strength  AROM;PROM;Strength      AROM   Right Shoulder Extension  45 Degrees    Right Shoulder Flexion  158 Degrees   passive 164   Right Shoulder ABduction  154 Degrees   passive 160 degrees   Right Shoulder Internal Rotation  57 Degrees   passive 65   Right Shoulder External Rotation  95 Degrees   passive 100     Strength   Overall Strength Comments  WNL  grossly 5/5   able to lift bil shoulder 25 # above head 10 times                    [facility] Adult PT Treatment/Exercise - 03/15/18 1138      Self-Care   Self-Care  Other Self-Care Comments    Other Self-Care Comments   use of tennis ball on posterior shoulder  for self myofascial release       Therapeutic Activites    Therapeutic Activities  Lifting    Lifting  Farmers carry with 30 lb right and 25 left and lifting 25 # above head 10 x with no pain discussed DL and demo with 25# and # 30 lb from floor      Exercises   Exercises  Shoulder      Shoulder Exercises: Seated   Other Seated Exercises  scapula retraction x 10         Shoulder Exercises: Standing   External Rotation  Right;15 reps    Theraband Level (Shoulder External Rotation)  Level 4 (Blue)    Internal Rotation  Right;15 reps    Theraband Level (Shoulder Internal Rotation)  Level 4 (Blue)    Extension  Both;15 reps    Theraband Level (Shoulder Extension)  Level 4 (Blue)    Row  Both;15 reps    Theraband Level (Shoulder Row)  Level 4 (Blue)      Shoulder Exercises: IT sales professional  30 seconds;4 reps    Corner Stretch Limitations  at 60 degrees and 90 degrees              PT Education - 03/15/18 1131    Education Details  given tennis ball for self myofascial stretch and increased t band to blue for exericse given    Person(s) Educated  Patient    Methods  Explanation;Demonstration;Tactile cues;Verbal cues    Comprehension  Verbalized understanding;Returned demonstration       PT Short Term Goals - 03/08/18 1153      PT SHORT TERM GOAL #1   Title   will be independent with initial HEP    Status  Achieved      PT SHORT TERM GOAL #2   Title  He will have 10-20% decr pain with use of RT arm    Status  Achieved        PT Long Term Goals - 03/15/18 1130      PT LONG TERM GOAL #1   Title  He will be independent with all HEP issued  Baseline  independent and increased t band for HEP given    Time  6    Period  Weeks    Status  Achieved       PT LONG TERM GOAL #2   Title  He will report pain as intermittant and decr 60% or more    Baseline  No pain for last two sessions    Time  6    Period  Weeks    Status  Achieved      PT LONG TERM GOAL #3   Title  He will have painfree use RT arm with self care    Baseline  Pt has no issues with bathing and dressing    Time  6    Period  Weeks    Status  Achieved      PT LONG TERM GOAL #4   Title  He will be able to lift trash and normal items at home with  min to no pain    Baseline  Pt able to lift items at home and was able to lift 25# above head 10 times    Time  6    Period  Weeks    Status  Achieved            Plan - 03/15/18 1141    Clinical Impression Statement  Mr Reister enters clinic with no pain in right shoulder for the last 2 weeks.  Pt performed HEP with increasing theraband resistance and no pain.  He was able to achieve all LTG including lifting 25 # above head x 2 without pain.  Pt made gains in every AROM ( See flow chart) and states he is ready to be discharged.  Pt also comments he is able to lift items and perform all ADL/ home and work duties without pain.  Pt will be discharged due to acheivement of goals and pt request.    Rehab Potential  Good    PT Frequency  3x / week    PT Treatment/Interventions  Passive range of motion;Therapeutic exercise;Patient/family education;Manual techniques;Cryotherapy;Moist Heat;Iontophoresis '4mg'$ /ml Dexamethasone;Therapeutic activities;Taping    PT Next Visit Plan  DC at pt request and achievement of all LTG this visit    PT Home Exercise Plan  isometrics , cross chest stretch, band row, extension , rockwood, lift and carry for home use of tennis ball for self myofascial release    Consulted and Agree with Plan of Care  Patient       Patient will benefit from skilled therapeutic intervention in order to improve the following deficits and impairments:  Pain, Impaired UE functional use, Decreased activity tolerance  Visit  Diagnosis: Chronic right shoulder pain  Stiffness of right shoulder, not elsewhere classified  Other muscle spasm     Problem List Patient Active Problem List   Diagnosis Date Noted  . Hemorrhoids 05/13/2012  . ONYCHOMYCOSIS, TOENAILS 11/06/2009  . 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 ELEVATED BP READING WITHOUT DX HYPERTENSION 11/06/2009  . ELEVATED PROSTATE SPECIFIC ANTIGEN 09/18/2009  . DIABETES MELLITUS, TYPE II 08/06/2009  . GLYCOSURIA 08/06/2009  . VENTRAL HERNIA 05/07/2009  . RECTAL BLEEDING 05/07/2009  . DYSPNEA ON EXERTION 05/07/2009   Voncille Lo, PT Certified Exercise Expert for the Aging Adult  03/15/18 11:53 AM Phone: [phone] Fax: [facility] [address] [place], [place], [address] Phone: [phone]   Fax:  [phone]  Name: Offie Waide MRN: [id number] Date of Birth: 1960-10-08   PHYSICAL  THERAPY DISCHARGE SUMMARY  Visits from Start of Care: 4  Current functional level related to goals / functional outcomes: As above   Remaining deficits: none   Education / Equipment: HEP with blue and black resistance bands Plan: Patient agrees to discharge.  Patient goals were met. Patient is being discharged due to meeting the stated rehab goals.  ?????    And pt request since he has no pain  Voncille Lo, PT Certified Exercise Expert for the Aging Adult  03/15/18 11:53 AM Phone: [phone] Fax: [phone]

## 2018-04-13 ENCOUNTER — Ambulatory Visit (INDEPENDENT_AMBULATORY_CARE_PROVIDER_SITE_OTHER): Payer: Medicaid Other | Admitting: Podiatry

## 2018-04-13 ENCOUNTER — Other Ambulatory Visit: Payer: Self-pay

## 2018-04-13 VITALS — BP 122/86 | HR 85

## 2018-04-13 DIAGNOSIS — M2011 Hallux valgus (acquired), right foot: Secondary | ICD-10-CM

## 2018-04-13 DIAGNOSIS — M2012 Hallux valgus (acquired), left foot: Secondary | ICD-10-CM | POA: Diagnosis not present

## 2018-04-13 DIAGNOSIS — B351 Tinea unguium: Secondary | ICD-10-CM | POA: Diagnosis not present

## 2018-04-13 DIAGNOSIS — M79674 Pain in right toe(s): Secondary | ICD-10-CM | POA: Diagnosis not present

## 2018-04-13 DIAGNOSIS — M79675 Pain in left toe(s): Secondary | ICD-10-CM

## 2018-04-13 DIAGNOSIS — M79671 Pain in right foot: Secondary | ICD-10-CM

## 2018-04-13 DIAGNOSIS — M79672 Pain in left foot: Secondary | ICD-10-CM

## 2018-04-13 NOTE — Patient Instructions (Signed)
Onychomycosis/Fungal Toenails  WHAT IS IT? An infection that lies within the keratin of your nail plate that is caused by a fungus.  WHY ME? Fungal infections affect all ages, sexes, races, and creeds.  There may be many factors that predispose you to a fungal infection such as age, coexisting medical conditions such as diabetes, or an autoimmune disease; stress, medications, fatigue, genetics, etc.  Bottom line: fungus thrives in a warm, moist environment and your shoes offer such a location.  IS IT CONTAGIOUS? Theoretically, yes.  You do not want to share shoes, nail clippers or files with someone who has fungal toenails.  Walking around barefoot in the same room or sleeping in the same bed is unlikely to transfer the organism.  It is important to realize, however, that fungus can spread easily from one nail to the next on the same foot.  HOW DO WE TREAT THIS?  There are several ways to treat this condition.  Treatment may depend on many factors such as age, medications, pregnancy, liver and kidney conditions, etc.  It is best to ask your doctor which options are available to you.  1. No treatment.   Unlike many other medical concerns, you can live with this condition.  However for many people this can be a painful condition and may lead to ingrown toenails or a bacterial infection.  It is recommended that you keep the nails cut short to help reduce the amount of fungal nail. 2. Topical treatment.  These range from herbal remedies to prescription strength nail lacquers.  About 40-50% effective, topicals require twice daily application for approximately 9 to 12 months or until an entirely new nail has grown out.  The most effective topicals are medical grade medications available through physicians offices. 3. Oral antifungal medications.  With an 80-90% cure rate, the most common oral medication requires 3 to 4 months of therapy and stays in your system for a year as the new nail grows out.  Oral  antifungal medications do require blood work to make sure it is a safe drug for you.  A liver function panel will be performed prior to starting the medication and after the first month of treatment.  It is important to have the blood work performed to avoid any harmful side effects.  In general, this medication safe but blood work is required. 4. Laser Therapy.  This treatment is performed by applying a specialized laser to the affected nail plate.  This therapy is noninvasive, fast, and non-painful.  It is not covered by insurance and is therefore, out of pocket.  The results have been very good with a 80-95% cure rate.  [facility] is the only practice in the area to offer this therapy. Permanent Nail Avulsion.  Removing the entire nail so that a new nail will not grow back.Corns and Calluses Corns are small areas of thickened skin that occur on the top, sides, or tip of a toe. They contain a cone-shaped core with a point that can press on a nerve below. This causes pain.  Calluses are areas of thickened skin that can occur anywhere on the body, including the hands, fingers, palms, soles of the feet, and heels. Calluses are usually larger than corns. What are the causes? Corns and calluses are caused by rubbing (friction) or pressure, such as from shoes that are too tight or do not fit properly. What increases the risk? Corns are more likely to develop in people who have misshapen   toes (toe deformities), such as hammer toes. Calluses can occur with friction to any area of the skin. They are more likely to develop in people who:  Work with their hands.  Wear shoes that fit poorly, are too tight, or are high-heeled.  Have toe deformities. What are the signs or symptoms? Symptoms of a corn or callus include:  A hard growth on the skin.  Pain or tenderness under the skin.  Redness and swelling.  Increased discomfort while wearing tight-fitting shoes, if your feet are affected. If a  corn or callus becomes infected, symptoms may include:  Redness and swelling that gets worse.  Pain.  Fluid, blood, or pus draining from the corn or callus. How is this diagnosed? Corns and calluses may be diagnosed based on your symptoms, your medical history, and a physical exam. How is this treated? Treatment for corns and calluses may include:  Removing the cause of the friction or pressure. This may involve: ? Changing your shoes. ? Wearing shoe inserts (orthotics) or other protective layers in your shoes, such as a corn pad. ? Wearing gloves.  Applying medicine to the skin (topical medicine) to help soften skin in the hardened, thickened areas.  Removing layers of dead skin with a file to reduce the size of the corn or callus.  Removing the corn or callus with a scalpel or laser.  Taking antibiotic medicines, if your corn or callus is infected.  Having surgery, if a toe deformity is the cause. Follow these instructions at home:   Take over-the-counter and prescription medicines only as told by your health care provider.  If you were prescribed an antibiotic, take it as told by your health care provider. Do not stop taking it even if your condition starts to improve.  Wear shoes that fit well. Avoid wearing high-heeled shoes and shoes that are too tight or too loose.  Wear any padding, protective layers, gloves, or orthotics as told by your health care provider.  Soak your hands or feet and then use a file or pumice stone to soften your corn or callus. Do this as told by your health care provider.  Check your corn or callus every day for symptoms of infection. Contact a health care provider if you:  Notice that your symptoms do not improve with treatment.  Have redness or swelling that gets worse.  Notice that your corn or callus becomes painful.  Have fluid, blood, or pus coming from your corn or callus.  Have new symptoms. Summary  Corns are small areas of  thickened skin that occur on the top, sides, or tip of a toe.  Calluses are areas of thickened skin that can occur anywhere on the body, including the hands, fingers, palms, and soles of the feet. Calluses are usually larger than corns.  Corns and calluses are caused by rubbing (friction) or pressure, such as from shoes that are too tight or do not fit properly.  Treatment may include wearing any padding, protective layers, gloves, or orthotics as told by your health care provider. This information is not intended to replace advice given to you by your health care provider. Make sure you discuss any questions you have with your health care provider. Document Released: 11/17/2003 Document Revised: 12/24/2016 Document Reviewed: 12/24/2016 Elsevier Interactive Patient Education  2019 [employer] Diabetes Mellitus and Foot Care Foot care is an important part of your health, especially when you have diabetes. Diabetes may cause you to have problems because of poor   blood flow (circulation) to your feet and legs, which can cause your skin to:  Become thinner and drier.  Break more easily.  Heal more slowly.  Peel and crack. You may also have nerve damage (neuropathy) in your legs and feet, causing decreased feeling in them. This means that you may not notice minor injuries to your feet that could lead to more serious problems. Noticing and addressing any potential problems early is the best way to prevent future foot problems. How to care for your feet Foot hygiene  Wash your feet daily with warm water and mild soap. Do not use hot water. Then, pat your feet and the areas between your toes until they are completely dry. Do not soak your feet as this can dry your skin.  Trim your toenails straight across. Do not dig under them or around the cuticle. File the edges of your nails with an emery board or nail file.  Apply a moisturizing lotion or petroleum jelly to the skin on your feet and to  dry, brittle toenails. Use lotion that does not contain alcohol and is unscented. Do not apply lotion between your toes. Shoes and socks  Wear clean socks or stockings every day. Make sure they are not too tight. Do not wear knee-high stockings since they may decrease blood flow to your legs.  Wear shoes that fit properly and have enough cushioning. Always look in your shoes before you put them on to be sure there are no objects inside.  To break in new shoes, wear them for just a few hours a day. This prevents injuries on your feet. Wounds, scrapes, corns, and calluses  Check your feet daily for blisters, cuts, bruises, sores, and redness. If you cannot see the bottom of your feet, use a mirror or ask someone for help.  Do not cut corns or calluses or try to remove them with medicine.  If you find a minor scrape, cut, or break in the skin on your feet, keep it and the skin around it clean and dry. You may clean these areas with mild soap and water. Do not clean the area with peroxide, alcohol, or iodine.  If you have a wound, scrape, corn, or callus on your foot, look at it several times a day to make sure it is healing and not infected. Check for: ? Redness, swelling, or pain. ? Fluid or blood. ? Warmth. ? Pus or a bad smell. General instructions  Do not cross your legs. This may decrease blood flow to your feet.  Do not use heating pads or hot water bottles on your feet. They may burn your skin. If you have lost feeling in your feet or legs, you may not know this is happening until it is too late.  Protect your feet from hot and cold by wearing shoes, such as at the beach or on hot pavement.  Schedule a complete foot exam at least once a year (annually) or more often if you have foot problems. If you have foot problems, report any cuts, sores, or bruises to your health care provider immediately. Contact a health care provider if:  You have a medical condition that increases your  risk of infection and you have any cuts, sores, or bruises on your feet.  You have an injury that is not healing.  You have redness on your legs or feet.  You feel burning or tingling in your legs or feet.  You have pain or cramps in   your legs and feet.  Your legs or feet are numb.  Your feet always feel cold.  You have pain around a toenail. Get help right away if:  You have a wound, scrape, corn, or callus on your foot and: ? You have pain, swelling, or redness that gets worse. ? You have fluid or blood coming from the wound, scrape, corn, or callus. ? Your wound, scrape, corn, or callus feels warm to the touch. ? You have pus or a bad smell coming from the wound, scrape, corn, or callus. ? You have a fever. ? You have a red line going up your leg. Summary  Check your feet every day for cuts, sores, red spots, swelling, and blisters.  Moisturize feet and legs daily.  Wear shoes that fit properly and have enough cushioning.  If you have foot problems, report any cuts, sores, or bruises to your health care provider immediately.  Schedule a complete foot exam at least once a year (annually) or more often if you have foot problems. This information is not intended to replace advice given to you by your health care provider. Make sure you discuss any questions you have with your health care provider. Document Released: 02/08/2000 Document Revised: 03/25/2017 Document Reviewed: 03/14/2016 Elsevier Interactive Patient Education  2019 [employer]  

## 2018-04-14 ENCOUNTER — Ambulatory Visit: Payer: Medicaid Other | Admitting: Podiatry

## 2018-04-14 ENCOUNTER — Ambulatory Visit (INDEPENDENT_AMBULATORY_CARE_PROVIDER_SITE_OTHER): Payer: Medicaid Other

## 2018-04-14 ENCOUNTER — Encounter: Payer: Self-pay | Admitting: Podiatry

## 2018-04-14 DIAGNOSIS — M21611 Bunion of right foot: Secondary | ICD-10-CM | POA: Diagnosis not present

## 2018-04-14 DIAGNOSIS — M2041 Other hammer toe(s) (acquired), right foot: Secondary | ICD-10-CM | POA: Diagnosis not present

## 2018-04-14 NOTE — Patient Instructions (Signed)
Pre-Operative Instructions  Congratulations, you have decided to take an important step towards improving your quality of life.  You can be assured that the doctors and staff at [facility] will be with you every step of the way.  Here are some important things you should know:  1. Plan to be at the surgery center/hospital at least 1 (one) hour prior to your scheduled time, unless otherwise directed by the surgical center/hospital staff.  You must have a responsible adult accompany you, remain during the surgery and drive you home.  Make sure you have directions to the surgical center/hospital to ensure you arrive on time. 2. If you are having surgery at [facility] or [facility], you will need a copy of your medical history and physical form from your family physician within one month prior to the date of surgery. We will give you a form for your primary physician to complete.  3. We make every effort to accommodate the date you request for surgery.  However, there are times where surgery dates or times have to be moved.  We will contact you as soon as possible if a change in schedule is required.   4. No aspirin/ibuprofen for one week before surgery.  If you are on aspirin, any non-steroidal anti-inflammatory medications (Mobic, Aleve, Ibuprofen) should not be taken seven (7) days prior to your surgery.  You make take Tylenol for pain prior to surgery.  5. Medications - If you are taking daily heart and blood pressure medications, seizure, reflux, allergy, asthma, anxiety, pain or diabetes medications, make sure you notify the surgery center/hospital before the day of surgery so they can tell you which medications you should take or avoid the day of surgery. 6. No food or drink after midnight the night before surgery unless directed otherwise by surgical center/hospital staff. 7. No alcoholic beverages 24-hours prior to surgery.  No smoking 24-hours prior or 24-hours after  surgery. 8. Wear loose pants or shorts. They should be loose enough to fit over bandages, boots, and casts. 9. Don't wear slip-on shoes. Sneakers are preferred. 10. Bring your boot with you to the surgery center/hospital.  Also bring crutches or a walker if your physician has prescribed it for you.  If you do not have this equipment, it will be provided for you after surgery. 11. If you have not been contacted by the surgery center/hospital by the day before your surgery, call to confirm the date and time of your surgery. 12. Leave-time from work may vary depending on the type of surgery you have.  Appropriate arrangements should be made prior to surgery with your employer. 13. Prescriptions will be provided immediately following surgery by your doctor.  Fill these as soon as possible after surgery and take the medication as directed. Pain medications will not be refilled on weekends and must be approved by the doctor. 14. Remove nail polish on the operative foot and avoid getting pedicures prior to surgery. 15. Wash the night before surgery.  The night before surgery wash the foot and leg well with water and the antibacterial soap provided. Be sure to pay special attention to beneath the toenails and in between the toes.  Wash for at least three (3) minutes. Rinse thoroughly with water and dry well with a towel.  Perform this wash unless told not to do so by your physician.  Enclosed: 1 Ice pack (please put in freezer the night before surgery)   1 Hibiclens skin cleaner     Pre-op instructions  If you have any questions regarding the instructions, please do not hesitate to call our office.  [place]: [address], [place], [place] [address] -- [phone]  [place]: [address] [place], [place] [address] -- [phone]  [place]: [address]  [place], [place] [address] -- [phone]  [place]: [address], [address], [place], [place] [address] -- [phone]  Website:  [email or web address] 

## 2018-04-18 NOTE — Progress Notes (Signed)
   Subjective: 58 year old male presenting today for a surgical consult for a hammertoe 2nd digit right foot that has been symptomatic for the last few years. Wearing shoes and walking increases the pain. He has been taking OTC pain medication and wearing good shoe gear for treatment without any significant relief. Patient is here for further evaluation and treatment.   Past Medical History:  Diagnosis Date  . Allergic rhinitis   . Arthritis   . At risk for sleep apnea    STOP-BANG= 4    SENT TO PCP 11-28-2013  . Exercise-induced asthma   . GERD (gastroesophageal reflux disease)   . Glaucoma   . History of MRSA infection    05/ 2014--  POSITIVE MRSA CULTURE SCROTAL ABSCESS  . Prostate cancer [facility])    s/p  prostatectomy 08/ 2014  . Skin tag of anus   . Type 2 diabetes mellitus (HCC)     Objective: Physical Exam General: The patient is alert and oriented x3 in no acute distress.  Dermatology: Skin is cool, dry and supple bilateral lower extremities. Negative for open lesions or macerations.  Vascular: Palpable pedal pulses bilaterally. No edema or erythema noted. Capillary refill within normal limits.  Neurological: Epicritic and protective threshold grossly intact bilaterally.   Musculoskeletal Exam: Clinical evidence of bunion deformity noted to the respective foot. There is moderate pain on palpation range of motion of the first MPJ. Lateral deviation of the hallux noted consistent with hallux abductovalgus. Hammertoe contracture also noted on clinical exam to the 2nd digit of the right foot. Symptomatic pain on palpation and range of motion also noted to the metatarsal phalangeal joints of the respective hammertoe digits.    Radiographic Exam: Increased intermetatarsal angle greater than 15 with a hallux abductus angle greater than 30 noted on AP view. Moderate degenerative changes noted within the first MPJ. Contracture deformity also noted to the interphalangeal joints and  MPJs of the digits of the respective hammertoes.  Assessment: 1. HAV w/ bunion deformity right 2. Hammertoe deformity 2nd digit right    Plan of Care:  1. Patient was evaluated. X-Rays reviewed. 2. Today we discussed the conservative versus surgical management of the presenting pathology. The patient opts for surgical management. All possible complications and details of the procedure were explained. All patient questions were answered. No guarantees were expressed or implied. 3. Authorization for surgery was initiated today. Surgery will consist of bunionectomy with double osteotomy right; PIPJ arthroplasty with MPJ capsulotomy right.  4. Return to clinic one week post op.    Edrick Kins, DPM [facility]  Dr. Edrick Kins, [place]                                        [place], [place] [address]                Office [phone]  Fax [phone]

## 2018-04-21 ENCOUNTER — Telehealth (INDEPENDENT_AMBULATORY_CARE_PROVIDER_SITE_OTHER): Payer: Self-pay | Admitting: Family Medicine

## 2018-04-21 NOTE — Telephone Encounter (Signed)
I received a call from atty Margaretmary Eddy with [place] office checking on their request for records. I verified with her that there are no records within the requested dates on their request.

## 2018-04-22 ENCOUNTER — Encounter: Payer: Self-pay | Admitting: Podiatry

## 2018-04-22 NOTE — Progress Notes (Signed)
Subjective: Cristian Welch presents today referred by Nolene Ebbs, MD with diabetes and cc of painful, discolored, thick toenails which interfere with daily activities.  Patient states toenails have been thick and discolored for years.  He is tried nothing to treat this condition.  Pain is aggravated when wearing enclosed shoe gear.   Patient has secondary concerns of painful bunions bilaterally, right more symptomatic than left.  He also has hammertoe right second digit.  Patient states pain is aggravated when wearing enclosed shoe gear and it is also difficult for him to purchase comfortable shoes.  He relates joint pain bunions towards the end of the day.  Past Medical History:  Diagnosis Date  . Allergic rhinitis   . Arthritis   . At risk for sleep apnea    STOP-BANG= 4    SENT TO PCP 11-28-2013  . Exercise-induced asthma   . GERD (gastroesophageal reflux disease)   . Glaucoma   . History of MRSA infection    05/ 2014--  POSITIVE MRSA CULTURE SCROTAL ABSCESS  . Prostate cancer [facility])    s/p  prostatectomy 08/ 2014  . Skin tag of anus   . Type 2 diabetes mellitus [facility])     Patient Active Problem List   Diagnosis Date Noted  . Hemorrhoids 05/13/2012  . ONYCHOMYCOSIS, TOENAILS 11/06/2009  . 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 ELEVATED BP READING WITHOUT DX HYPERTENSION 11/06/2009  . ELEVATED PROSTATE SPECIFIC ANTIGEN 09/18/2009  . DIABETES MELLITUS, TYPE II 08/06/2009  . GLYCOSURIA 08/06/2009  . VENTRAL HERNIA 05/07/2009  . RECTAL BLEEDING 05/07/2009  . DYSPNEA ON EXERTION 05/07/2009    Past Surgical History:  Procedure Laterality Date  . CIRCUMCISION N/A 08/04/2012   Procedure: CIRCUMCISION ADULT;  Surgeon: Molli Hazard, MD;  Location: [facility];  Service: Urology;  Laterality: N/A;  90 MIN ALSO PROSTATE NERVE BLOCK   . EXCISION OF SKIN TAG N/A 12/01/2013   Procedure: EXCISION OF ANAL SKIN TAG;  Surgeon: Leighton Ruff, MD;  Location: [facility];  Service: General;  Laterality: N/A;  . FOOT SURGERY Left 1994 (approx)  . INGUINAL HERNIA REPAIR Right 2000 (approx)  . IRRIGATION AND DEBRIDEMENT ABSCESS Right 07/07/2012   Procedure: IRRIGATION AND DEBRIDEMENT ABSCESS;  Surgeon: Molli Hazard, MD;  Location: [facility]  Service: Urology;  Laterality: Right;  and scrotal abcesses  . LYMPHADENECTOMY Bilateral 10/06/2012   Procedure: WITH BILATERAL PELVIC LYMPH NODE DISSECTION ;  Surgeon: Molli Hazard, MD;  Location: [facility];  Service: Urology;  Laterality: Bilateral;  . PROSTATE BIOPSY N/A 08/04/2012   Procedure: BIOPSY TRANSRECTAL ULTRASONIC PROSTATE (TUBP);  Surgeon: Molli Hazard, MD;  Location: [facility];  Service: Urology;  Laterality: N/A;  . ROBOT ASSISTED LAPAROSCOPIC RADICAL PROSTATECTOMY N/A 10/06/2012   Procedure: ROBOTIC ASSISTED LAPAROSCOPIC RADICAL PROSTATECTOMY LEVEL 2;  Surgeon: Molli Hazard, MD;  Location: [facility];  Service: Urology;  Laterality: N/A;  . TRANSTHORACIC ECHOCARDIOGRAM  06-06-2009   NORMAL LVF AND LVSF/ EF 55-60%     Current Outpatient Medications:  .  acetaminophen (TYLENOL) 500 MG tablet, Take 2 tablets (1,000 mg total) by mouth every 6 (six) hours as needed., Disp: 30 tablet, Rfl: 0 .  acyclovir (ZOVIRAX) 400 MG tablet, Take 1 tablet (400 mg total) by mouth 4 (four) times daily., Disp: 40 tablet, Rfl: 0 .  albuterol (PROVENTIL HFA;VENTOLIN HFA) 108 (90 BASE) MCG/ACT inhaler, Inhale 2 puffs into the lungs every 6 (six) hours as needed  for wheezing., Disp: , Rfl:  .  AZOPT 1 % ophthalmic suspension, INSTILL 1 DROP INTO AFFECTED EYE(S) THREE TIMES DAILY, Disp: , Rfl: 5 .  cetirizine (ZYRTEC) 10 MG tablet, Take 10 mg by mouth daily., Disp: , Rfl:  .  cyclobenzaprine (FLEXERIL) 10 MG tablet, Take 1 tablet (10 mg total) by mouth 2 (two) times daily as needed for muscle spasms., Disp: 20 tablet, Rfl: 0 .  diclofenac (VOLTAREN) 75 MG EC  tablet, TAKE 1 TABLET BY MOUTH TWICE DAILY WITH FOOD AS NEEDED, Disp: , Rfl:  .  DORZOLAMIDE HCL OP, Apply 1 drop to eye 3 (three) times daily., Disp: , Rfl:  .  fluticasone (FLONASE) 50 MCG/ACT nasal spray, Place 1 spray into both nostrils daily., Disp: 16 g, Rfl: 0 .  glimepiride (AMARYL) 4 MG tablet, Take 4 mg by mouth 2 (two) times daily., Disp: , Rfl:  .  HYDROcodone-acetaminophen (NORCO/VICODIN) 5-325 MG tablet, Take 1 tablet by mouth every 6 (six) hours as needed for moderate pain., Disp: 12 tablet, Rfl: 0 .  INVOKANA 100 MG TABS tablet, Take 100 mg by mouth daily., Disp: , Rfl: 5 .  lovastatin (MEVACOR) 20 MG tablet, Take 20 mg by mouth every morning., Disp: , Rfl:  .  metFORMIN (GLUCOPHAGE) 1000 MG tablet, Take 1,000 mg by mouth 2 (two) times daily., Disp: , Rfl: 5 .  methocarbamol (ROBAXIN) 500 MG tablet, Take 1 tablet (500 mg total) by mouth every 8 (eight) hours as needed for muscle spasms., Disp: 30 tablet, Rfl: 0 .  naproxen (NAPROSYN) 500 MG tablet, Take 1 tablet (500 mg total) by mouth 2 (two) times daily., Disp: 20 tablet, Rfl: 0 .  tiZANidine (ZANAFLEX) 4 MG tablet, Take 4 mg by mouth 2 (two) times daily as needed., Disp: , Rfl:  .  travoprost, benzalkonium, (TRAVATAN) 0.004 % ophthalmic solution, Place 1 drop into both eyes at bedtime., Disp: , Rfl:   Current Facility-Administered Medications:  .  methylPREDNISolone acetate (DEPO-MEDROL) injection 40 mg, 40 mg, Intra-articular, Once, Hilts, Michael, MD  Allergies  Allergen Reactions  . Cephalexin Rash    Social History   Occupational History  . Not on file  Tobacco Use  . Smoking status: Former Smoker    Packs/day: 0.25    Years: 20.00    Pack years: 5.00    Types: Cigarettes    Last attempt to quit: 02/24/2002    Years since quitting: 16.1  . Smokeless tobacco: Never Used  Substance and Sexual Activity  . Alcohol use: No  . Drug use: No    Types: Cocaine, Marijuana, LSD    Comment: last used (approx.  2000)  .  Sexual activity: Not on file    No family history on file.  Immunization History  Administered Date(s) Administered  . Influenza Whole 01/04/2010  . Pneumococcal Polysaccharide-23 11/06/2009  . Td 05/14/2010     Review of systems: Positive Findings in bold print.  Constitutional:  chills, fatigue, fever, sweats, weight change Communication: Optometrist, sign Ecologist, hand writing, iPad/Android device Head: headaches, head injury Eyes: changes in vision, eye pain, glaucoma, cataracts, macular degeneration, diplopia, glare,  light sensitivity, eyeglasses or contacts, blindness Ears nose mouth throat: Hard of hearing, ringing in ears, deaf, sign language,  vertigo,   nosebleeds, allergic rhinitis,  cold sores, snoring, swollen glands Cardiovascular: HTN, edema, arrhythmia, pacemaker in place, defibrillator in place,  chest pain/tightness, chronic anticoagulation, blood clot, heart failure Peripheral Vascular: leg cramps, varicose veins, blood clots, lymphedema  Respiratory: Asthma, difficulty breathing, denies congestion, SOB, wheezing, cough, emphysema Gastrointestinal: change in appetite or weight, abdominal pain, constipation, diarrhea, nausea, vomiting, vomiting blood, change in bowel habits, abdominal pain, jaundice, rectal bleeding, hemorrhoids, Genitourinary:  nocturia,  pain on urination,  blood in urine, Foley catheter, urinary urgency Musculoskeletal: uses mobility aid,  cramping, stiff joints, painful joints, decreased joint, bilateral bunion deformity,  motion, fractures, OA, gout, pain in feet Skin: +changes in toenails, color change, dryness, itching, mole changes,  rash  Neurological: headaches, numbness in feet, paresthesias in feet, burning in feet, fainting,  seizures, change in speech. denies headaches, memory problems/poor historian, cerebral palsy, weakness, paralysis Endocrine: diabetes, hypothyroidism, hyperthyroidism,  goiter, dry mouth, flushing, heat  intolerance,  cold intolerance,  excessive thirst, denies polyuria,  nocturia Hematological:  easy bleeding, excessive bleeding, easy bruising, enlarged lymph nodes, on long term blood thinner, history of past transusions Allergy/immunological:  hives, eczema, frequent infections, multiple drug allergies, seasonal allergies, transplant recipient Psychiatric:  anxiety, depression, mood disorder, suicidal ideations, hallucinations   Objective: Vascular Examination: Capillary refill time immediate x 10 digits.  Dorsalis pedis and posterior tibial pulses present b/l.  No digital hair x 10 digits.  Skin temperature gradient WNL b/l.  Dermatological Examination: Skin with normal turgor, texture and tone b/l  Toenails 1-5 b/l discolored, thick, dystrophic with subungual debris and pain with palpation to nailbeds due to thickness of nails.  Musculoskeletal: Muscle strength 5/5 to all LE muscle groups  Severe bunion deformity right foot with hammertoe right second digit.  There is pain on palpation with passive range of motion of right first MPJ.  Moderate bunion deformity left foot.  Neurological: Sensation intact with 10 gram monofilament  Vibratory sensation intact.  Assessment: 1. Painful onychomycosis toenails 1-5 b/l  2. NIDDM  Plan: 1. Discussed diabetic foot care principles. Literature dispensed on today. 2. Discussed topical and oral treatment for onychomycosis.  Toenails 1-5 b/l were debrided in length and girth without iatrogenic bleeding.  Patient like to think about it. 3. Patient to continue soft, supportive shoe gear.  Patient advised to purchase wide shoe gear. 4. Discussed conservative versus surgical treatment modalities for bilateral bunion deformity.  Patient is interested in surgical correction of his findings.  I have referred him to Dr. Daylene Katayama for surgical consultation regarding his bilateral bunion and hammertoe deformities. 5. Patient to report any pedal  injuries to medical professional immediately. 6. He will follow-up with me in 3 months for his routine diabetic foot care. 7. Patient/POA to call should there be a concern in the interim.

## 2018-05-28 ENCOUNTER — Telehealth: Payer: Self-pay | Admitting: *Deleted

## 2018-05-28 NOTE — Telephone Encounter (Signed)
I'm calling you in regards to your surgery that's scheduled for June 03, 2018.  We need to reschedule it.  "Okay, when are we going to do it?"  We're going to reschedule your appointment to Jul 22, 2018.  "Okay, thank you so much."  I rescheduled his surgery from 06/03/2018 to 07/22/2018 via the surgical center's [facility] [employer].

## 2018-06-17 ENCOUNTER — Telehealth: Payer: Self-pay | Admitting: *Deleted

## 2018-06-17 NOTE — Telephone Encounter (Signed)
"  Somebody gave me a call."  I called to see if you would like to move your surgery date up.  The surgical center has reopened.  "Yes ma'am, I can do it tomorrow or next week if I have to."  Dr. Amalia Hailey can do it on May 7.  "Put me down for then.  What time do I need to be there? This is in [place], right?"  Yes, it's in [place].  Someone from the surgical center will call you a day or two prior to your surgery date.  They will give you your arrival time.  I'll get your surgery rescheduled.  I rescheduled his surgery from 07/22/2018 to 07/01/2018 via the surgical center's [facility] Portal.

## 2018-06-17 NOTE — Telephone Encounter (Signed)
I left the patient a message to call me regarding rescheduling his appointment that's scheduled for Jul 22, 2018. I informed him we can do it sooner if he's interested.

## 2018-07-05 ENCOUNTER — Other Ambulatory Visit: Payer: Medicaid Other

## 2018-07-14 ENCOUNTER — Ambulatory Visit: Payer: Medicaid Other | Admitting: Podiatry

## 2018-07-14 ENCOUNTER — Other Ambulatory Visit: Payer: Self-pay

## 2018-07-14 VITALS — Temp 97.5°F

## 2018-07-14 DIAGNOSIS — M79674 Pain in right toe(s): Secondary | ICD-10-CM

## 2018-07-14 DIAGNOSIS — B351 Tinea unguium: Secondary | ICD-10-CM | POA: Diagnosis not present

## 2018-07-14 DIAGNOSIS — E119 Type 2 diabetes mellitus without complications: Secondary | ICD-10-CM | POA: Diagnosis not present

## 2018-07-14 DIAGNOSIS — M79675 Pain in left toe(s): Secondary | ICD-10-CM

## 2018-07-22 ENCOUNTER — Other Ambulatory Visit: Payer: Self-pay | Admitting: Podiatry

## 2018-07-22 DIAGNOSIS — M2011 Hallux valgus (acquired), right foot: Secondary | ICD-10-CM

## 2018-07-22 DIAGNOSIS — M7751 Other enthesopathy of right foot: Secondary | ICD-10-CM

## 2018-07-22 DIAGNOSIS — M2041 Other hammer toe(s) (acquired), right foot: Secondary | ICD-10-CM | POA: Diagnosis not present

## 2018-07-22 DIAGNOSIS — M2021 Hallux rigidus, right foot: Secondary | ICD-10-CM

## 2018-07-22 MED ORDER — OXYCODONE-ACETAMINOPHEN 5-325 MG PO TABS: 1.0000 | ORAL_TABLET | Freq: Four times a day (QID) | ORAL | 0 refills | Status: DC | PRN

## 2018-07-22 NOTE — Progress Notes (Signed)
.  postop

## 2018-07-24 ENCOUNTER — Encounter: Payer: Self-pay | Admitting: Podiatry

## 2018-07-24 NOTE — Progress Notes (Signed)
Subjective: Cristian Welch presents today with painful, thick toenails 1-5 b/l that he cannot cut and which interfere with daily activities.  Pain is aggravated when wearing enclosed shoe gear.  He states he is scheduled for bunion surgery with Dr. Amalia Hailey on May 28th.  Nolene Ebbs, MD is his PCP.  Medications reviewed.  Allergies  Allergen Reactions  . Cephalexin Rash    Objective: Vitals:   07/14/18 1317  Temp: (!) 97.5 F (36.4 C)   Vascular Examination: Capillary refill time immediate x 10 digits.  Dorsalis pedis and Posterior tibial pulses palpable b/l.  Digital hair absent  x 10 digits.  Skin temperature gradient WNL b/l.  Dermatological Examination: Skin with normal turgor, texture and tone b/l.  Toenails 1-5 b/l discolored, thick, dystrophic with subungual debris and pain with palpation to nailbeds due to thickness of nails.  Musculoskeletal: Muscle strength 5/5 to all LE muscle groups  Severe bunion deformity right foot with hammertoe right 2nd digit.  Moderate bunion deformity left foot.  No pain, crepitus or joint limitation noted with ROM.   Neurological: Sensation intact with 10 gram monofilament.  Vibratory sensation intact.  Assessment: Painful onychomycosis toenails 1-5 b/l  NIDDM  Plan: 1. Toenails 1-5 b/l were debrided in length and girth without iatrogenic bleeding. 2. Patient to continue wide, soft, supportive shoe gear daily. 3. Patient to report any pedal injuries to medical professional immediately. 4. Follow up 3 months.  5. Patient/POA to call should there be a concern in the interim.

## 2018-07-28 ENCOUNTER — Other Ambulatory Visit: Payer: Self-pay

## 2018-07-28 ENCOUNTER — Ambulatory Visit (INDEPENDENT_AMBULATORY_CARE_PROVIDER_SITE_OTHER): Payer: Medicaid Other

## 2018-07-28 ENCOUNTER — Ambulatory Visit (INDEPENDENT_AMBULATORY_CARE_PROVIDER_SITE_OTHER): Payer: Medicaid Other | Admitting: Podiatry

## 2018-07-28 ENCOUNTER — Encounter: Payer: Self-pay | Admitting: Podiatry

## 2018-07-28 VITALS — Temp 97.7°F

## 2018-07-28 DIAGNOSIS — M21611 Bunion of right foot: Secondary | ICD-10-CM

## 2018-07-28 DIAGNOSIS — Z9889 Other specified postprocedural states: Secondary | ICD-10-CM

## 2018-07-28 DIAGNOSIS — M2041 Other hammer toe(s) (acquired), right foot: Secondary | ICD-10-CM | POA: Diagnosis not present

## 2018-07-28 MED ORDER — SULFAMETHOXAZOLE-TRIMETHOPRIM 800-160 MG PO TABS: 1.0000 | ORAL_TABLET | Freq: Two times a day (BID) | ORAL | 0 refills | Status: DC

## 2018-07-28 MED ORDER — OXYCODONE-ACETAMINOPHEN 5-325 MG PO TABS: 1.0000 | ORAL_TABLET | Freq: Four times a day (QID) | ORAL | 0 refills | Status: DC | PRN

## 2018-07-28 NOTE — Progress Notes (Signed)
   Subjective:  Patient presents today status post bunionectomy with hammertoe repair second digit right foot. DOS: 07/14/2018.  Patient states that he did an excessive amount of walking on the surgical foot despite instructions to rest with elevation.  He says he is doing a lot better on the right foot and has been icing and elevating occasionally.  Past Medical History:  Diagnosis Date  . Allergic rhinitis   . Arthritis   . At risk for sleep apnea    STOP-BANG= 4    SENT TO PCP 11-28-2013  . Exercise-induced asthma   . GERD (gastroesophageal reflux disease)   . Glaucoma   . History of MRSA infection    05/ 2014--  POSITIVE MRSA CULTURE SCROTAL ABSCESS  . Prostate cancer [facility])    s/p  prostatectomy 08/ 2014  . Skin tag of anus   . Type 2 diabetes mellitus ([place])       Objective/Physical Exam Neurovascular status intact.  Heavy erythema with edema noted to the surgical foot extending up to the level of the ankle.  Maceration noted along the incision sites.  Skin incisions appear to be coapted despite the heavy drainage with edema.  No malodor noted.  Radiographic Exam:  Orthopedic hardware and osteotomies sites appear to be stable with routine healing.  Assessment: 1. s/p bunionectomy with double osteotomy and hammertoe repair second digit right foot. DOS: 07/22/2018 2.  Postoperative cellulitis   Plan of Care:  1. Patient was evaluated. X-rays reviewed 2.  Prescription for Bactrim DS #20 3.  Dressings were changed today.  Keep clean dry and intact until next follow-up visit 4.  Stressed the importance of minimal ambulation.  Keep the immobilization cam boot on at all times.  Elevate with ice. 5.  Refill prescription for Percocet 5/325 mg  6.  Return to clinic in 5 days   Edrick Kins, DPM [facility]  Dr. Edrick Kins, [place]                                        [place], [place] [address]                Office [phone]  Fax  [phone]

## 2018-08-04 ENCOUNTER — Telehealth: Payer: Self-pay | Admitting: *Deleted

## 2018-08-04 ENCOUNTER — Ambulatory Visit (INDEPENDENT_AMBULATORY_CARE_PROVIDER_SITE_OTHER): Payer: Medicaid Other

## 2018-08-04 ENCOUNTER — Other Ambulatory Visit: Payer: Self-pay | Admitting: Podiatry

## 2018-08-04 ENCOUNTER — Ambulatory Visit (INDEPENDENT_AMBULATORY_CARE_PROVIDER_SITE_OTHER): Payer: Medicaid Other | Admitting: Podiatry

## 2018-08-04 ENCOUNTER — Other Ambulatory Visit: Payer: Self-pay

## 2018-08-04 VITALS — Temp 98.1°F

## 2018-08-04 DIAGNOSIS — M2041 Other hammer toe(s) (acquired), right foot: Secondary | ICD-10-CM

## 2018-08-04 DIAGNOSIS — M21611 Bunion of right foot: Secondary | ICD-10-CM | POA: Diagnosis not present

## 2018-08-04 DIAGNOSIS — Z9889 Other specified postprocedural states: Secondary | ICD-10-CM

## 2018-08-04 MED ORDER — CIPROFLOXACIN HCL 500 MG PO TABS: 500.0000 mg | ORAL_TABLET | Freq: Two times a day (BID) | ORAL | 0 refills | Status: DC

## 2018-08-04 MED ORDER — AMOXICILLIN-POT CLAVULANATE 875-125 MG PO TABS: 1.0000 | ORAL_TABLET | Freq: Two times a day (BID) | ORAL | 0 refills | Status: DC

## 2018-08-04 MED ORDER — OXYCODONE-ACETAMINOPHEN 5-325 MG PO TABS: 1.0000 | ORAL_TABLET | Freq: Four times a day (QID) | ORAL | 0 refills | Status: DC | PRN

## 2018-08-04 NOTE — Telephone Encounter (Signed)
done

## 2018-08-04 NOTE — Telephone Encounter (Signed)
Pt called states the pharmacy doesn't have a pain medication, it has an antibiotic.

## 2018-08-04 NOTE — Progress Notes (Signed)
   This patient presents the office follow-up for bunion surgery right foot and hammertoe repair of the second digit with K wire fixation.  His x-rays with doctor is Dr. Amalia Hailey.  Patient states he is not having much pain and discomfort in his foot.  Patient was seen 1 week ago and he was diagnosed as having a postoperative cellulitis by Dr. Amalia Hailey.  Patient was prescribed Bactrim DS #20 to take twice a day.  He states he has been faithfully taking his antibiotics.  He states that his right foot does not look like it has improved since last week.  He does say he is not having much discomfort but prior to leaving request pain medicine to be called into the pharmacy for this patient.  Patient presents the office with a bandage that was applied at the office 1 week ago.    Neurovascular status is intact for this patient.  Patient has wound dehiscence noted at the distal aspect of the incision over the first metatarsal and wound dehiscense towards the proximal aspect of the incision.  There is continued redness swelling and increased temperature noted on the dorsum of his right foot.  The right foot continues to be swollen and red but there is no streaking nor temperature noted for this patient.  K wire is intact in the second digit right foot.  S/P foot surgery right foot.  ROV.  X-rays were taken of this patient revealing good position   at the head of the first metatarsal and good position of the osteotomy of  proximal phalanx .  The position of the osteotomies appear to be in acceptable positions.  Discussed this condition with this patient.  Told him that after I reviewed his x-rays the bone work was in healing position.  I did tell him that I believe that the medication that he is taken for the infection Septra needs to be changed.  Patient was prescribed Augmentin and Cipro to be taken daily.  A picture of his surgical plate infected foot is seen above.  The foot was then re-bandaged and he was told to  ambulate with the cam walker.  Dr. Jacqualyn Posey was going to call in pain medicine for this patient.  Patient to return to the office in 1 week for an evaluation by Dr. Amalia Hailey.  Consulted with Dr. Jacqualyn Posey concerning this patient.   Gardiner Barefoot DPM

## 2018-08-09 ENCOUNTER — Telehealth: Payer: Self-pay | Admitting: Podiatry

## 2018-08-09 ENCOUNTER — Ambulatory Visit (INDEPENDENT_AMBULATORY_CARE_PROVIDER_SITE_OTHER): Payer: Medicaid Other | Admitting: Podiatry

## 2018-08-09 ENCOUNTER — Encounter: Payer: Self-pay | Admitting: Podiatry

## 2018-08-09 ENCOUNTER — Other Ambulatory Visit: Payer: Self-pay

## 2018-08-09 VITALS — Temp 97.1°F

## 2018-08-09 DIAGNOSIS — L03115 Cellulitis of right lower limb: Secondary | ICD-10-CM

## 2018-08-09 DIAGNOSIS — M21611 Bunion of right foot: Secondary | ICD-10-CM

## 2018-08-09 DIAGNOSIS — M2041 Other hammer toe(s) (acquired), right foot: Secondary | ICD-10-CM

## 2018-08-09 MED ORDER — OXYCODONE-ACETAMINOPHEN 5-325 MG PO TABS: 1.0000 | ORAL_TABLET | Freq: Four times a day (QID) | ORAL | 0 refills | Status: DC | PRN

## 2018-08-09 NOTE — Telephone Encounter (Signed)
Pt wanted to speak with Nurse about some drainage/odor. But , I got patient in.

## 2018-08-09 NOTE — Progress Notes (Signed)
Subjective: Cristian Welch is a 58 y.o. is seen today in office s/p right foot bunionectomy, second digit hammertoe repair with metatarsal osteotomy performed by Dr. Amalia Hailey.  He presents today as he states that he has noticed some drainage coming from the incision.  He was seen last week by Dr. Prudence Davidson for concerns of infection.  He was started on Augmentin and Cipro.  Dr. Prudence Davidson discussed this with me after the patient left the office this is my first time seeing him. Denies any systemic complaints such as fevers, chills, nausea, vomiting. No calf pain, chest pain, shortness of breath.   His mother did pass away over the weekend and has been on his feet more.  Objective: General: No acute distress, AAOx3  DP/PT pulses palpable 2/4, CRT < 3 sec to all digits.  Protective sensation intact. Motor function intact.  RIGHT foot: Incision is well coapted without any evidence of dehiscence.  He states he was getting some clear drainage from the bunion site and he points on the proximal aspect medical to elicit this today.  On the second toe there was surrounding skin epidermalysis that I debrided today but again there was no purulence.  Since this is my first time seeing him Cristian Mccoy, RN also saw him and states that the swelling looks much better patient also agrees that the swelling is better.  No significant pain. No other open lesions or pre-ulcerative lesions.  No pain with calf compression, swelling, warmth, erythema.   Assessment and Plan:  Status post right foot surgery with resolving infection  -Treatment options discussed including all alternatives, risks, and complications -Finish course of antibiotics.  Betadine was applied over the incision followed by dry sterile dressing. -Remain in cam boot -Ice/elevation -Pain medication as needed. -Monitor for any clinical signs or symptoms of infection and DVT/PE and directed to call the office immediately should any occur or go to the  ER. -Follow-up in 1 week with Dr. Amalia Hailey or sooner if any problems arise. In the meantime, encouraged to call the office with any questions, concerns, change in symptoms.   Celesta Gentile, DPM

## 2018-08-12 ENCOUNTER — Encounter: Payer: Self-pay | Admitting: Podiatry

## 2018-08-18 ENCOUNTER — Other Ambulatory Visit: Payer: Self-pay | Admitting: Podiatry

## 2018-08-18 ENCOUNTER — Ambulatory Visit (INDEPENDENT_AMBULATORY_CARE_PROVIDER_SITE_OTHER): Payer: Medicaid Other | Admitting: Podiatry

## 2018-08-18 ENCOUNTER — Other Ambulatory Visit: Payer: Self-pay

## 2018-08-18 ENCOUNTER — Ambulatory Visit (INDEPENDENT_AMBULATORY_CARE_PROVIDER_SITE_OTHER): Payer: Medicaid Other

## 2018-08-18 VITALS — Temp 97.2°F

## 2018-08-18 DIAGNOSIS — Z9889 Other specified postprocedural states: Secondary | ICD-10-CM

## 2018-08-18 DIAGNOSIS — L03115 Cellulitis of right lower limb: Secondary | ICD-10-CM | POA: Diagnosis not present

## 2018-08-18 DIAGNOSIS — M21611 Bunion of right foot: Secondary | ICD-10-CM

## 2018-08-18 MED ORDER — OXYCODONE-ACETAMINOPHEN 5-325 MG PO TABS: 1.0000 | ORAL_TABLET | Freq: Four times a day (QID) | ORAL | 0 refills | Status: DC | PRN

## 2018-08-20 ENCOUNTER — Telehealth: Payer: Self-pay

## 2018-08-20 NOTE — Telephone Encounter (Signed)
I need to talk to Dr Rebekah [place] nurse about my foot

## 2018-08-21 NOTE — Progress Notes (Signed)
   Subjective:  Patient presents today status post bunionectomy with hammertoe repair second digit right foot. DOS: 07/14/2018. He has finished his course of antibiotics as directed, has been taking Percocet for pain and using the CAM boot as directed. There are no modifying factors noted. Patient is here for further evaluation and treatment.   Past Medical History:  Diagnosis Date  . Allergic rhinitis   . Arthritis   . At risk for sleep apnea    STOP-BANG= 4    SENT TO PCP 11-28-2013  . Exercise-induced asthma   . GERD (gastroesophageal reflux disease)   . Glaucoma   . History of MRSA infection    05/ 2014--  POSITIVE MRSA CULTURE SCROTAL ABSCESS  . Prostate cancer [facility])    s/p  prostatectomy 08/ 2014  . Skin tag of anus   . Type 2 diabetes mellitus ([place])       Objective/Physical Exam Neurovascular status intact. Maceration noted along the incision sites.  Skin incisions appear to be coapted despite the heavy drainage with edema.  No malodor noted.  Radiographic Exam:  Orthopedic hardware and osteotomies sites appear to be stable with routine healing.  Assessment: 1. s/p bunionectomy with double osteotomy and hammertoe repair second digit right foot. DOS: 07/22/2018 2. Postoperative cellulitis - resolved    Plan of Care:  1. Patient was evaluated. X-rays reviewed 2. Sutures and staples removed.  3. Discontinue using CAM boot.  4. Post op shoe dispensed.  5. Refill prescription for Percocet 5/325 mg proved to patient.  6. Return to clinic in 4 weeks.    Edrick Kins, DPM [facility]  Dr. Edrick Kins, [address]                                        [place], [place] [address]                Office [phone]  Fax [phone]

## 2018-09-15 ENCOUNTER — Ambulatory Visit (INDEPENDENT_AMBULATORY_CARE_PROVIDER_SITE_OTHER): Payer: Medicaid Other | Admitting: Podiatry

## 2018-09-15 ENCOUNTER — Other Ambulatory Visit: Payer: Self-pay

## 2018-09-15 ENCOUNTER — Ambulatory Visit (INDEPENDENT_AMBULATORY_CARE_PROVIDER_SITE_OTHER): Payer: Medicaid Other

## 2018-09-15 ENCOUNTER — Other Ambulatory Visit: Payer: Self-pay | Admitting: Podiatry

## 2018-09-15 DIAGNOSIS — L03115 Cellulitis of right lower limb: Secondary | ICD-10-CM

## 2018-09-15 DIAGNOSIS — M2041 Other hammer toe(s) (acquired), right foot: Secondary | ICD-10-CM

## 2018-09-15 DIAGNOSIS — M21611 Bunion of right foot: Secondary | ICD-10-CM

## 2018-09-15 DIAGNOSIS — Z9889 Other specified postprocedural states: Secondary | ICD-10-CM

## 2018-09-22 NOTE — Progress Notes (Signed)
   Subjective:  Patient presents today status post bunionectomy with hammertoe repair second digit right foot. DOS: 07/14/2018. He states he is doing well overall. He reports some continued swelling around the 1st and 2nd digits. He denies any modifying factors. He has been using ice therapy and soaking the foot in alcohol and salt water. Patient is here for further evaluation and treatment.   Past Medical History:  Diagnosis Date  . Allergic rhinitis   . Arthritis   . At risk for sleep apnea    STOP-BANG= 4    SENT TO PCP 11-28-2013  . Exercise-induced asthma   . GERD (gastroesophageal reflux disease)   . Glaucoma   . History of MRSA infection    05/ 2014--  POSITIVE MRSA CULTURE SCROTAL ABSCESS  . Prostate cancer [facility])    s/p  prostatectomy 08/ 2014  . Skin tag of anus   . Type 2 diabetes mellitus ([place])       Objective/Physical Exam Neurovascular status intact. Maceration noted along the incision sites.  Skin incisions appear to be coapted despite the heavy drainage with edema.  No malodor noted.  Radiographic Exam:  Orthopedic hardware and osteotomies sites appear to be stable with routine healing.  Assessment: 1. s/p bunionectomy with double osteotomy and hammertoe repair second digit right foot. DOS: 07/22/2018   Plan of Care:  1. Patient was evaluated. X-rays reviewed 2. Discontinue using post op shoe. Recommended good shoe gear.  3. Compression anklet dispensed.  4. Slowly increase activity.  5. Return to clinic in 6 weeks for final X-Ray.     Edrick Kins, DPM [facility]  Dr. Edrick Kins, [address]                                        [place], [place] [address]                Office [phone]  Fax [phone]

## 2018-10-13 ENCOUNTER — Encounter: Payer: Self-pay | Admitting: Podiatry

## 2018-10-13 ENCOUNTER — Other Ambulatory Visit: Payer: Self-pay

## 2018-10-13 ENCOUNTER — Ambulatory Visit: Payer: Medicaid Other | Admitting: Podiatry

## 2018-10-13 VITALS — Temp 98.0°F

## 2018-10-13 DIAGNOSIS — M79675 Pain in left toe(s): Secondary | ICD-10-CM | POA: Diagnosis not present

## 2018-10-13 DIAGNOSIS — M79674 Pain in right toe(s): Secondary | ICD-10-CM

## 2018-10-13 DIAGNOSIS — E119 Type 2 diabetes mellitus without complications: Secondary | ICD-10-CM

## 2018-10-13 DIAGNOSIS — B351 Tinea unguium: Secondary | ICD-10-CM | POA: Diagnosis not present

## 2018-10-13 NOTE — Patient Instructions (Signed)
Diabetes Mellitus and Foot Care Foot care is an important part of your health, especially when you have diabetes. Diabetes may cause you to have problems because of poor blood flow (circulation) to your feet and legs, which can cause your skin to:  Become thinner and drier.  Break more easily.  Heal more slowly.  Peel and crack. You may also have nerve damage (neuropathy) in your legs and feet, causing decreased feeling in them. This means that you may not notice minor injuries to your feet that could lead to more serious problems. Noticing and addressing any potential problems early is the best way to prevent future foot problems. How to care for your feet Foot hygiene  Wash your feet daily with warm water and mild soap. Do not use hot water. Then, pat your feet and the areas between your toes until they are completely dry. Do not soak your feet as this can dry your skin.  Trim your toenails straight across. Do not dig under them or around the cuticle. File the edges of your nails with an emery board or nail file.  Apply a moisturizing lotion or petroleum jelly to the skin on your feet and to dry, brittle toenails. Use lotion that does not contain alcohol and is unscented. Do not apply lotion between your toes. Shoes and socks  Wear clean socks or stockings every day. Make sure they are not too tight. Do not wear knee-high stockings since they may decrease blood flow to your legs.  Wear shoes that fit properly and have enough cushioning. Always look in your shoes before you put them on to be sure there are no objects inside.  To break in new shoes, wear them for just a few hours a day. This prevents injuries on your feet. Wounds, scrapes, corns, and calluses  Check your feet daily for blisters, cuts, bruises, sores, and redness. If you cannot see the bottom of your feet, use a mirror or ask someone for help.  Do not cut corns or calluses or try to remove them with medicine.  If you  find a minor scrape, cut, or break in the skin on your feet, keep it and the skin around it clean and dry. You may clean these areas with mild soap and water. Do not clean the area with peroxide, alcohol, or iodine.  If you have a wound, scrape, corn, or callus on your foot, look at it several times a day to make sure it is healing and not infected. Check for: ? Redness, swelling, or pain. ? Fluid or blood. ? Warmth. ? Pus or a bad smell. General instructions  Do not cross your legs. This may decrease blood flow to your feet.  Do not use heating pads or hot water bottles on your feet. They may burn your skin. If you have lost feeling in your feet or legs, you may not know this is happening until it is too late.  Protect your feet from hot and cold by wearing shoes, such as at the beach or on hot pavement.  Schedule a complete foot exam at least once a year (annually) or more often if you have foot problems. If you have foot problems, report any cuts, sores, or bruises to your health care provider immediately. Contact a health care provider if:  You have a medical condition that increases your risk of infection and you have any cuts, sores, or bruises on your feet.  You have an injury that is not   healing.  You have redness on your legs or feet.  You feel burning or tingling in your legs or feet.  You have pain or cramps in your legs and feet.  Your legs or feet are numb.  Your feet always feel cold.  You have pain around a toenail. Get help right away if:  You have a wound, scrape, corn, or callus on your foot and: ? You have pain, swelling, or redness that gets worse. ? You have fluid or blood coming from the wound, scrape, corn, or callus. ? Your wound, scrape, corn, or callus feels warm to the touch. ? You have pus or a bad smell coming from the wound, scrape, corn, or callus. ? You have a fever. ? You have a red line going up your leg. Summary  Check your feet every day  for cuts, sores, red spots, swelling, and blisters.  Moisturize feet and legs daily.  Wear shoes that fit properly and have enough cushioning.  If you have foot problems, report any cuts, sores, or bruises to your health care provider immediately.  Schedule a complete foot exam at least once a year (annually) or more often if you have foot problems. This information is not intended to replace advice given to you by your health care provider. Make sure you discuss any questions you have with your health care provider. Document Released: 02/08/2000 Document Revised: 03/25/2017 Document Reviewed: 03/14/2016 Elsevier Patient Education  2020 Elsevier Inc.   Onychomycosis/Fungal Toenails  WHAT IS IT? An infection that lies within the keratin of your nail plate that is caused by a fungus.  WHY ME? Fungal infections affect all ages, sexes, races, and creeds.  There may be many factors that predispose you to a fungal infection such as age, coexisting medical conditions such as diabetes, or an autoimmune disease; stress, medications, fatigue, genetics, etc.  Bottom line: fungus thrives in a warm, moist environment and your shoes offer such a location.  IS IT CONTAGIOUS? Theoretically, yes.  You do not want to share shoes, nail clippers or files with someone who has fungal toenails.  Walking around barefoot in the same room or sleeping in the same bed is unlikely to transfer the organism.  It is important to realize, however, that fungus can spread easily from one nail to the next on the same foot.  HOW DO WE TREAT THIS?  There are several ways to treat this condition.  Treatment may depend on many factors such as age, medications, pregnancy, liver and kidney conditions, etc.  It is best to ask your doctor which options are available to you.  1. No treatment.   Unlike many other medical concerns, you can live with this condition.  However for many people this can be a painful condition and may lead to  ingrown toenails or a bacterial infection.  It is recommended that you keep the nails cut short to help reduce the amount of fungal nail. 2. Topical treatment.  These range from herbal remedies to prescription strength nail lacquers.  About 40-50% effective, topicals require twice daily application for approximately 9 to 12 months or until an entirely new nail has grown out.  The most effective topicals are medical grade medications available through physicians offices. 3. Oral antifungal medications.  With an 80-90% cure rate, the most common oral medication requires 3 to 4 months of therapy and stays in your system for a year as the new nail grows out.  Oral antifungal medications do require   blood work to make sure it is a safe drug for you.  A liver function panel will be performed prior to starting the medication and after the first month of treatment.  It is important to have the blood work performed to avoid any harmful side effects.  In general, this medication safe but blood work is required. 4. Laser Therapy.  This treatment is performed by applying a specialized laser to the affected nail plate.  This therapy is noninvasive, fast, and non-painful.  It is not covered by insurance and is therefore, out of pocket.  The results have been very good with a 80-95% cure rate.  The [facility] is the only practice in the area to offer this therapy. 5. Permanent Nail Avulsion.  Removing the entire nail so that a new nail will not grow back. 

## 2018-10-21 NOTE — Progress Notes (Signed)
Subjective: Cristian Welch presents today with painful, thick toenails 1-5 b/l that he cannot cut and which interfere with daily activities.  Pain is aggravated when wearing enclosed shoe gear.  He had bunion correction right foot. He would like topical recommendations for his scar.  Nolene Ebbs, MD is his PCP.   Current Outpatient Medications:  .  acetaminophen (TYLENOL) 500 MG tablet, Take 2 tablets (1,000 mg total) by mouth every 6 (six) hours as needed., Disp: 30 tablet, Rfl: 0 .  acyclovir (ZOVIRAX) 400 MG tablet, Take 1 tablet (400 mg total) by mouth 4 (four) times daily., Disp: 40 tablet, Rfl: 0 .  albuterol (PROVENTIL HFA;VENTOLIN HFA) 108 (90 BASE) MCG/ACT inhaler, Inhale 2 puffs into the lungs every 6 (six) hours as needed for wheezing., Disp: , Rfl:  .  amoxicillin-clavulanate (AUGMENTIN) 875-125 MG tablet, Take 1 tablet by mouth 2 (two) times daily., Disp: 20 tablet, Rfl: 0 .  AZOPT 1 % ophthalmic suspension, INSTILL 1 DROP INTO AFFECTED EYE(S) THREE TIMES DAILY, Disp: , Rfl: 5 .  cetirizine (ZYRTEC) 10 MG tablet, Take 10 mg by mouth daily., Disp: , Rfl:  .  ciprofloxacin (CIPRO) 500 MG tablet, Take 1 tablet (500 mg total) by mouth 2 (two) times daily., Disp: 20 tablet, Rfl: 0 .  cyclobenzaprine (FLEXERIL) 10 MG tablet, Take 1 tablet (10 mg total) by mouth 2 (two) times daily as needed for muscle spasms., Disp: 20 tablet, Rfl: 0 .  diclofenac (VOLTAREN) 75 MG EC tablet, TAKE 1 TABLET BY MOUTH TWICE DAILY WITH FOOD AS NEEDED, Disp: , Rfl:  .  DORZOLAMIDE HCL OP, Apply 1 drop to eye 3 (three) times daily., Disp: , Rfl:  .  fluticasone (FLONASE) 50 MCG/ACT nasal spray, Place 1 spray into both nostrils daily., Disp: 16 g, Rfl: 0 .  glimepiride (AMARYL) 4 MG tablet, Take 4 mg by mouth 2 (two) times daily., Disp: , Rfl:  .  HYDROcodone-acetaminophen (NORCO/VICODIN) 5-325 MG tablet, Take 1 tablet by mouth every 6 (six) hours as needed for moderate pain., Disp: 12 tablet, Rfl: 0 .   INVOKANA 100 MG TABS tablet, Take 100 mg by mouth daily., Disp: , Rfl: 5 .  lovastatin (MEVACOR) 20 MG tablet, Take 20 mg by mouth every morning., Disp: , Rfl:  .  LUMIGAN 0.01 % SOLN, INSTILL 1 DROP INTO EACH EYE IN THE EVENING SEPARATE BY AT LEAST 10 MINUTES FROM OTHER INTRAOCULAR PRESSURE REDUCING OPHTHALMIC DRUGS, Disp: , Rfl:  .  metFORMIN (GLUCOPHAGE) 1000 MG tablet, Take 1,000 mg by mouth 2 (two) times daily., Disp: , Rfl: 5 .  methocarbamol (ROBAXIN) 500 MG tablet, Take 1 tablet (500 mg total) by mouth every 8 (eight) hours as needed for muscle spasms., Disp: 30 tablet, Rfl: 0 .  naproxen (NAPROSYN) 500 MG tablet, Take 1 tablet (500 mg total) by mouth 2 (two) times daily., Disp: 20 tablet, Rfl: 0 .  oxyCODONE-acetaminophen (PERCOCET) 5-325 MG tablet, Take 1 tablet by mouth every 6 (six) hours as needed for severe pain., Disp: 20 tablet, Rfl: 0 .  tiZANidine (ZANAFLEX) 4 MG tablet, Take 4 mg by mouth 2 (two) times daily as needed., Disp: , Rfl:  .  travoprost, benzalkonium, (TRAVATAN) 0.004 % ophthalmic solution, Place 1 drop into both eyes at bedtime., Disp: , Rfl:   Current Facility-Administered Medications:  .  methylPREDNISolone acetate (DEPO-MEDROL) injection 40 mg, 40 mg, Intra-articular, Once, Hilts, Michael, MD  Allergies  Allergen Reactions  . Cephalexin Rash    Objective: Vitals:  10/13/18 1313  Temp: 98 F (36.7 C)    Vascular Examination: Capillary refill time immediate x 10 digits.  Dorsalis pedis and Posterior tibial pulses palpable b/l.  Digital hair absent b/l.  Skin temperature gradient WNL b/l.  Dermatological Examination: Skin with normal turgor, texture and tone b/l.  Toenails 1-5 b/l discolored, thick, dystrophic with subungual debris and pain with palpation to nailbeds due to thickness of nails.  Well healed longitudinal surgical scar right foot.   Musculoskeletal: Muscle strength 5/5 to all LE muscle groups.  Bunion deformity left LE.  S/p  bunionectomy right LE.  No pain, crepitus or joint limitation noted with ROM.   Neurological: Sensation intact with 10 gram monofilament.  Vibratory sensation intact.  Assessment: Painful onychomycosis toenails 1-5 b/l  S/p bunion correction right foot NIDDM  Plan: 1. Toenails 1-5 b/l were debrided in length and girth without iatrogenic bleeding. 2. Recommended Mederma Scar Gel for his surgical scar. 3. Patient to continue soft, supportive shoe gear daily. 4. Patient to report any pedal injuries to medical professional immediately. 5. Follow up 3 months.  6. Patient/POA to call should there be a concern in the interim.

## 2018-10-27 ENCOUNTER — Other Ambulatory Visit: Payer: Self-pay

## 2018-10-27 ENCOUNTER — Ambulatory Visit (INDEPENDENT_AMBULATORY_CARE_PROVIDER_SITE_OTHER): Payer: Medicaid Other | Admitting: Podiatry

## 2018-10-27 ENCOUNTER — Ambulatory Visit (INDEPENDENT_AMBULATORY_CARE_PROVIDER_SITE_OTHER): Payer: Medicaid Other

## 2018-10-27 DIAGNOSIS — Z9889 Other specified postprocedural states: Secondary | ICD-10-CM

## 2018-10-27 DIAGNOSIS — M21611 Bunion of right foot: Secondary | ICD-10-CM | POA: Diagnosis not present

## 2018-10-31 NOTE — Progress Notes (Signed)
   Subjective:  Patient presents today status post bunionectomy with hammertoe repair second digit right foot. DOS: 07/14/2018. He states he is doing well. He denies any pain or modifying factors. He was using the compression anklet but no longer does because he does not feel he needs it. He denies any new complaints or concerns. Patient is here for further evaluation and treatment.   Past Medical History:  Diagnosis Date  . Allergic rhinitis   . Arthritis   . At risk for sleep apnea    STOP-BANG= 4    SENT TO PCP 11-28-2013  . Exercise-induced asthma   . GERD (gastroesophageal reflux disease)   . Glaucoma   . History of MRSA infection    05/ 2014--  POSITIVE MRSA CULTURE SCROTAL ABSCESS  . Prostate cancer [facility])    s/p  prostatectomy 08/ 2014  . Skin tag of anus   . Type 2 diabetes mellitus ([place])       Objective/Physical Exam Neurovascular status intact.  Skin incisions appear to be well coapted. No sign of infectious process noted. No dehiscence. No active bleeding noted. Moderate edema noted to the surgical extremity.  Radiographic Exam:  Orthopedic hardware and osteotomies sites appear to be stable with routine healing.  Assessment: 1. s/p bunionectomy with double osteotomy and hammertoe repair second digit right foot. DOS: 07/22/2018   Plan of Care:  1. Patient was evaluated. X-rays reviewed 2. May resume full activity with no restrictions.  3. Recommended good shoe gear.  4. Return to clinic as needed.     Edrick Kins, DPM [facility]  Dr. Edrick Kins, [address]                                        [place], [place] [address]                Office [phone]  Fax [phone]

## 2019-01-14 ENCOUNTER — Other Ambulatory Visit: Payer: Self-pay

## 2019-01-14 ENCOUNTER — Encounter: Payer: Self-pay | Admitting: Podiatry

## 2019-01-14 ENCOUNTER — Ambulatory Visit: Payer: Medicaid Other | Admitting: Podiatry

## 2019-01-14 DIAGNOSIS — M79674 Pain in right toe(s): Secondary | ICD-10-CM

## 2019-01-14 DIAGNOSIS — B351 Tinea unguium: Secondary | ICD-10-CM

## 2019-01-14 DIAGNOSIS — M79675 Pain in left toe(s): Secondary | ICD-10-CM

## 2019-01-14 NOTE — Patient Instructions (Signed)
Diabetes Mellitus and Foot Care Foot care is an important part of your health, especially when you have diabetes. Diabetes may cause you to have problems because of poor blood flow (circulation) to your feet and legs, which can cause your skin to:  Become thinner and drier.  Break more easily.  Heal more slowly.  Peel and crack. You may also have nerve damage (neuropathy) in your legs and feet, causing decreased feeling in them. This means that you may not notice minor injuries to your feet that could lead to more serious problems. Noticing and addressing any potential problems early is the best way to prevent future foot problems. How to care for your feet Foot hygiene  Wash your feet daily with warm water and mild soap. Do not use hot water. Then, pat your feet and the areas between your toes until they are completely dry. Do not soak your feet as this can dry your skin.  Trim your toenails straight across. Do not dig under them or around the cuticle. File the edges of your nails with an emery board or nail file.  Apply a moisturizing lotion or petroleum jelly to the skin on your feet and to dry, brittle toenails. Use lotion that does not contain alcohol and is unscented. Do not apply lotion between your toes. Shoes and socks  Wear clean socks or stockings every day. Make sure they are not too tight. Do not wear knee-high stockings since they may decrease blood flow to your legs.  Wear shoes that fit properly and have enough cushioning. Always look in your shoes before you put them on to be sure there are no objects inside.  To break in new shoes, wear them for just a few hours a day. This prevents injuries on your feet. Wounds, scrapes, corns, and calluses  Check your feet daily for blisters, cuts, bruises, sores, and redness. If you cannot see the bottom of your feet, use a mirror or ask someone for help.  Do not cut corns or calluses or try to remove them with medicine.  If you  find a minor scrape, cut, or break in the skin on your feet, keep it and the skin around it clean and dry. You may clean these areas with mild soap and water. Do not clean the area with peroxide, alcohol, or iodine.  If you have a wound, scrape, corn, or callus on your foot, look at it several times a day to make sure it is healing and not infected. Check for: ? Redness, swelling, or pain. ? Fluid or blood. ? Warmth. ? Pus or a bad smell. General instructions  Do not cross your legs. This may decrease blood flow to your feet.  Do not use heating pads or hot water bottles on your feet. They may burn your skin. If you have lost feeling in your feet or legs, you may not know this is happening until it is too late.  Protect your feet from hot and cold by wearing shoes, such as at the beach or on hot pavement.  Schedule a complete foot exam at least once a year (annually) or more often if you have foot problems. If you have foot problems, report any cuts, sores, or bruises to your health care provider immediately. Contact a health care provider if:  You have a medical condition that increases your risk of infection and you have any cuts, sores, or bruises on your feet.  You have an injury that is not   healing.  You have redness on your legs or feet.  You feel burning or tingling in your legs or feet.  You have pain or cramps in your legs and feet.  Your legs or feet are numb.  Your feet always feel cold.  You have pain around a toenail. Get help right away if:  You have a wound, scrape, corn, or callus on your foot and: ? You have pain, swelling, or redness that gets worse. ? You have fluid or blood coming from the wound, scrape, corn, or callus. ? Your wound, scrape, corn, or callus feels warm to the touch. ? You have pus or a bad smell coming from the wound, scrape, corn, or callus. ? You have a fever. ? You have a red line going up your leg. Summary  Check your feet every day  for cuts, sores, red spots, swelling, and blisters.  Moisturize feet and legs daily.  Wear shoes that fit properly and have enough cushioning.  If you have foot problems, report any cuts, sores, or bruises to your health care provider immediately.  Schedule a complete foot exam at least once a year (annually) or more often if you have foot problems. This information is not intended to replace advice given to you by your health care provider. Make sure you discuss any questions you have with your health care provider. Document Released: 02/08/2000 Document Revised: 03/25/2017 Document Reviewed: 03/14/2016 Elsevier Patient Education  2020 [employer]  

## 2019-01-22 NOTE — Progress Notes (Signed)
Subjective: Cristian Welch is seen today for follow up painful, elongated, thickened toenails bilateral feet that he cannot cut. Pain interferes with daily activities. Aggravating factor includes wearing enclosed shoe gear and relieved with periodic debridement.  Medications reviewed in chart.  Allergies  Allergen Reactions  . Cephalexin Rash    Objective:  Vascular Examination: Capillary refill time immediate b/l.  Dorsalis pedis present b/l.  Posterior tibial pulses present b/l.  Digital hair absent b/l.  Skin temperature gradient WNL b/l.   Dermatological Examination: Skin with normal turgor, texture and tone b/l.  Well healed surgical scar right foot.   Toenails 1-5 b/l discolored, thick, dystrophic with subungual debris and pain with palpation to nailbeds due to thickness of nails.  Hyperkeratotic lesion submet head 3, 5 left, left hallux and b/l heels with tenderness to palpation. No edema, no erythema, no drainage, no flocculence.  Musculoskeletal: Muscle strength 5/5 to all LE muscle groups b/l.  HAV with bunion left foot.   No pain, crepitus or joint limitation noted with ROM.   Neurological Examination: Protective sensation intact with 10 gram monofilament bilaterally.  Epicritic sensation present bilaterally.  Vibratory sensation intact bilaterally.   Assessment: Painful onychomycosis toenails 1-5 b/l  Calluses submet head 3, 5 left, left hallux and b/l heels  NIDDM  Plan: 1. Toenails 1-5 b/l were debrided in length and girth without iatrogenic bleeding. As courtesy, calluses pared submet head 3, 5 left, left hallux and b/l heels utilizing sterile scalpel blade without incident. 2. Patient to continue soft, supportive shoe gear daily. 3. Patient to report any pedal injuries to medical professional immediately. 4. Follow up 3 months.  5. Patient/POA to call should there be a concern in the interim.

## 2019-03-12 ENCOUNTER — Other Ambulatory Visit: Payer: Self-pay

## 2019-03-12 DIAGNOSIS — Z20822 Contact with and (suspected) exposure to covid-19: Secondary | ICD-10-CM

## 2019-03-13 LAB — NOVEL CORONAVIRUS, NAA: SARS-CoV-2, NAA: NOT DETECTED

## 2019-04-22 ENCOUNTER — Encounter: Payer: Self-pay | Admitting: Podiatry

## 2019-04-22 ENCOUNTER — Ambulatory Visit (INDEPENDENT_AMBULATORY_CARE_PROVIDER_SITE_OTHER): Payer: Medicaid Other | Admitting: Podiatry

## 2019-04-22 ENCOUNTER — Other Ambulatory Visit: Payer: Self-pay

## 2019-04-22 VITALS — Temp 98.0°F

## 2019-04-22 DIAGNOSIS — E119 Type 2 diabetes mellitus without complications: Secondary | ICD-10-CM

## 2019-04-22 DIAGNOSIS — M79675 Pain in left toe(s): Secondary | ICD-10-CM | POA: Diagnosis not present

## 2019-04-22 DIAGNOSIS — B351 Tinea unguium: Secondary | ICD-10-CM

## 2019-04-22 DIAGNOSIS — M79674 Pain in right toe(s): Secondary | ICD-10-CM

## 2019-04-22 DIAGNOSIS — L84 Corns and callosities: Secondary | ICD-10-CM

## 2019-04-22 NOTE — Patient Instructions (Signed)
Diabetes Mellitus and Foot Care Foot care is an important part of your health, especially when you have diabetes. Diabetes may cause you to have problems because of poor blood flow (circulation) to your feet and legs, which can cause your skin to:  Become thinner and drier.  Break more easily.  Heal more slowly.  Peel and crack. You may also have nerve damage (neuropathy) in your legs and feet, causing decreased feeling in them. This means that you may not notice minor injuries to your feet that could lead to more serious problems. Noticing and addressing any potential problems early is the best way to prevent future foot problems. How to care for your feet Foot hygiene  Wash your feet daily with warm water and mild soap. Do not use hot water. Then, pat your feet and the areas between your toes until they are completely dry. Do not soak your feet as this can dry your skin.  Trim your toenails straight across. Do not dig under them or around the cuticle. File the edges of your nails with an emery board or nail file.  Apply a moisturizing lotion or petroleum jelly to the skin on your feet and to dry, brittle toenails. Use lotion that does not contain alcohol and is unscented. Do not apply lotion between your toes. Shoes and socks  Wear clean socks or stockings every day. Make sure they are not too tight. Do not wear knee-high stockings since they may decrease blood flow to your legs.  Wear shoes that fit properly and have enough cushioning. Always look in your shoes before you put them on to be sure there are no objects inside.  To break in new shoes, wear them for just a few hours a day. This prevents injuries on your feet. Wounds, scrapes, corns, and calluses  Check your feet daily for blisters, cuts, bruises, sores, and redness. If you cannot see the bottom of your feet, use a mirror or ask someone for help.  Do not cut corns or calluses or try to remove them with medicine.  If you  find a minor scrape, cut, or break in the skin on your feet, keep it and the skin around it clean and dry. You may clean these areas with mild soap and water. Do not clean the area with peroxide, alcohol, or iodine.  If you have a wound, scrape, corn, or callus on your foot, look at it several times a day to make sure it is healing and not infected. Check for: ? Redness, swelling, or pain. ? Fluid or blood. ? Warmth. ? Pus or a bad smell. General instructions  Do not cross your legs. This may decrease blood flow to your feet.  Do not use heating pads or hot water bottles on your feet. They may burn your skin. If you have lost feeling in your feet or legs, you may not know this is happening until it is too late.  Protect your feet from hot and cold by wearing shoes, such as at the beach or on hot pavement.  Schedule a complete foot exam at least once a year (annually) or more often if you have foot problems. If you have foot problems, report any cuts, sores, or bruises to your health care provider immediately. Contact a health care provider if:  You have a medical condition that increases your risk of infection and you have any cuts, sores, or bruises on your feet.  You have an injury that is not   healing.  You have redness on your legs or feet.  You feel burning or tingling in your legs or feet.  You have pain or cramps in your legs and feet.  Your legs or feet are numb.  Your feet always feel cold.  You have pain around a toenail. Get help right away if:  You have a wound, scrape, corn, or callus on your foot and: ? You have pain, swelling, or redness that gets worse. ? You have fluid or blood coming from the wound, scrape, corn, or callus. ? Your wound, scrape, corn, or callus feels warm to the touch. ? You have pus or a bad smell coming from the wound, scrape, corn, or callus. ? You have a fever. ? You have a red line going up your leg. Summary  Check your feet every day  for cuts, sores, red spots, swelling, and blisters.  Moisturize feet and legs daily.  Wear shoes that fit properly and have enough cushioning.  If you have foot problems, report any cuts, sores, or bruises to your health care provider immediately.  Schedule a complete foot exam at least once a year (annually) or more often if you have foot problems. This information is not intended to replace advice given to you by your health care provider. Make sure you discuss any questions you have with your health care provider. Document Revised: 11/03/2018 Document Reviewed: 03/14/2016 Elsevier Patient Education  2020 Elsevier Inc.  

## 2019-04-23 NOTE — Progress Notes (Signed)
Subjective: Cristian Welch presents today for follow up of preventative diabetic foot care and painful mycotic nails b/l that are difficult to trim. Pain interferes with ambulation. Aggravating factors include wearing enclosed shoe gear. Pain is relieved with periodic professional debridement.   Allergies  Allergen Reactions  . Cephalexin Rash     Objective: Vitals:   04/22/19 1304  Temp: 98 F (36.7 C)    Vascular Examination:  Capillary refill time to digits immediate b/l, palpable DP pulses b/l, palpable PT pulses b/l, pedal hair absent b/l and skin temperature gradient within normal limits b/l  Dermatological Examination: Pedal skin with normal turgor, texture and tone bilaterally, no open wounds bilaterally, no interdigital macerations bilaterally, toenails 1-5 b/l elongated, dystrophic, thickened, crumbly with subungual debris, well healed surgical scar dorsal right first metatarsal and hyperkeratotic lesion(s) submet head 3 b/l, submet head 5 right foot, b/l hallux and b/l heels.  No erythema, no edema, no drainage, no flocculence  Musculoskeletal: Normal muscle strength 5/5 to all lower extremity muscle groups bilaterally, no pain crepitus or joint limitation noted with ROM b/l and HAV with bunion left foot  Neurological: Protective sensation intact 5/5 intact bilaterally with 10g monofilament b/l and vibratory sensation intact b/l  Assessment: 1. Pain due to onychomycosis of toenails of both feet   2. Callus   3. Controlled type 2 diabetes mellitus without complication, without long-term current use of insulin ([place])    Plan: -Mr. Davoli refused callus paring on today's visit. Medicaid ABN signed for 2021. Copy given to patient on today's visit and copy placed in patient chart. Gave advice for gently filing calluses and he may come back anytime he wants the services done. He will have to sign a new ABN if he wants calluses pared. He related understanding. -Continue diabetic  foot care principles. Literature dispensed on today.  -Toenails 1-5 b/l were debrided in length and girth with sterile nail nippers and dremel without iatrogenic bleeding. -Patient to continue soft, supportive shoe gear daily. -Patient/POA to call should there be question/concern in the interim.  Return in about 3 months (around 07/20/2019) for diabetic nail trim.

## 2019-06-03 ENCOUNTER — Encounter: Payer: Self-pay | Admitting: Internal Medicine

## 2019-07-12 ENCOUNTER — Ambulatory Visit (AMBULATORY_SURGERY_CENTER): Payer: Self-pay | Admitting: *Deleted

## 2019-07-12 ENCOUNTER — Other Ambulatory Visit: Payer: Self-pay

## 2019-07-12 VITALS — Temp 97.8°F | Ht 73.0 in | Wt 232.0 lb

## 2019-07-12 DIAGNOSIS — Z1211 Encounter for screening for malignant neoplasm of colon: Secondary | ICD-10-CM

## 2019-07-12 MED ORDER — NA SULFATE-K SULFATE-MG SULF 17.5-3.13-1.6 GM/177ML PO SOLN: 1.0000 | Freq: Once | ORAL | 0 refills | Status: AC

## 2019-07-12 NOTE — Progress Notes (Signed)

## 2019-07-22 ENCOUNTER — Encounter: Payer: Self-pay | Admitting: Podiatry

## 2019-07-22 ENCOUNTER — Other Ambulatory Visit: Payer: Self-pay

## 2019-07-22 ENCOUNTER — Ambulatory Visit (INDEPENDENT_AMBULATORY_CARE_PROVIDER_SITE_OTHER): Payer: Medicaid Other | Admitting: Podiatry

## 2019-07-22 DIAGNOSIS — M79675 Pain in left toe(s): Secondary | ICD-10-CM | POA: Diagnosis not present

## 2019-07-22 DIAGNOSIS — B351 Tinea unguium: Secondary | ICD-10-CM | POA: Diagnosis not present

## 2019-07-22 DIAGNOSIS — E119 Type 2 diabetes mellitus without complications: Secondary | ICD-10-CM

## 2019-07-22 DIAGNOSIS — M79674 Pain in right toe(s): Secondary | ICD-10-CM

## 2019-07-22 NOTE — Progress Notes (Signed)
Subjective: Cristian Welch presents today preventative diabetic foot care and painful mycotic nails b/l that are difficult to trim. Pain interferes with ambulation. Aggravating factors include wearing enclosed shoe gear. Pain is relieved with periodic professional debridement.   He voices no new pedal problems on today's visit. He continues to file his calluses between visits.  Nolene Ebbs, MD is patient's PCP.  Past Medical History:  Diagnosis Date  . Allergic rhinitis   . Arthritis   . At risk for sleep apnea    STOP-BANG= 4    SENT TO PCP 11-28-2013  . Exercise-induced asthma   . GERD (gastroesophageal reflux disease)   . Glaucoma   . History of MRSA infection    05/ 2014--  POSITIVE MRSA CULTURE SCROTAL ABSCESS  . Prostate cancer [facility])    s/p  prostatectomy 08/ 2014  . Skin tag of anus   . Type 2 diabetes mellitus ([place])      Current Outpatient Medications on File Prior to Visit  Medication Sig Dispense Refill  . acetaminophen (TYLENOL) 500 MG tablet Take 2 tablets (1,000 mg total) by mouth every 6 (six) hours as needed. 30 tablet 0  . acyclovir (ZOVIRAX) 400 MG tablet Take 1 tablet (400 mg total) by mouth 4 (four) times daily. 40 tablet 0  . albuterol (PROVENTIL HFA;VENTOLIN HFA) 108 (90 BASE) MCG/ACT inhaler Inhale 2 puffs into the lungs every 6 (six) hours as needed for wheezing.    Marland Kitchen amoxicillin-clavulanate (AUGMENTIN) 875-125 MG tablet Take 1 tablet by mouth 2 (two) times daily. 20 tablet 0  . AZOPT 1 % ophthalmic suspension INSTILL 1 DROP INTO AFFECTED EYE(S) THREE TIMES DAILY  5  . cetirizine (ZYRTEC) 10 MG tablet Take 10 mg by mouth daily.    . ciprofloxacin (CIPRO) 500 MG tablet Take 1 tablet (500 mg total) by mouth 2 (two) times daily. 20 tablet 0  . cyclobenzaprine (FLEXERIL) 10 MG tablet Take 1 tablet (10 mg total) by mouth 2 (two) times daily as needed for muscle spasms. 20 tablet 0  . diclofenac (VOLTAREN) 75 MG EC tablet TAKE 1 TABLET BY MOUTH TWICE DAILY WITH  FOOD AS NEEDED    . DORZOLAMIDE HCL OP Apply 1 drop to eye 3 (three) times daily.    . fluticasone (FLONASE) 50 MCG/ACT nasal spray Place 1 spray into both nostrils daily. 16 g 0  . glimepiride (AMARYL) 4 MG tablet Take 4 mg by mouth 2 (two) times daily.    Marland Kitchen HYDROcodone-acetaminophen (NORCO/VICODIN) 5-325 MG tablet Take 1 tablet by mouth every 6 (six) hours as needed for moderate pain. 12 tablet 0  . INVOKANA 100 MG TABS tablet Take 100 mg by mouth daily.  5  . lovastatin (MEVACOR) 20 MG tablet Take 20 mg by mouth every morning.    Marland Kitchen LUMIGAN 0.01 % SOLN INSTILL 1 DROP INTO EACH EYE IN THE EVENING SEPARATE BY AT LEAST 10 MINUTES FROM OTHER INTRAOCULAR PRESSURE REDUCING OPHTHALMIC DRUGS    . metFORMIN (GLUCOPHAGE) 1000 MG tablet Take 1,000 mg by mouth 2 (two) times daily.  5  . methocarbamol (ROBAXIN) 500 MG tablet Take 1 tablet (500 mg total) by mouth every 8 (eight) hours as needed for muscle spasms. (Patient not taking: Reported on 07/12/2019) 30 tablet 0  . MOBIC 7.5 MG tablet Take 7.5 mg by mouth daily.    . naproxen (NAPROSYN) 500 MG tablet Take 1 tablet (500 mg total) by mouth 2 (two) times daily. (Patient not taking: Reported on 07/12/2019) 20  tablet 0  . oxyCODONE-acetaminophen (PERCOCET) 5-325 MG tablet Take 1 tablet by mouth every 6 (six) hours as needed for severe pain. (Patient not taking: Reported on 07/12/2019) 20 tablet 0  . tiZANidine (ZANAFLEX) 4 MG tablet Take 4 mg by mouth 2 (two) times daily as needed.    . travoprost, benzalkonium, (TRAVATAN) 0.004 % ophthalmic solution Place 1 drop into both eyes at bedtime.     Current Facility-Administered Medications on File Prior to Visit  Medication Dose Route Frequency Provider Last Rate Last Admin  . methylPREDNISolone acetate (DEPO-MEDROL) injection 40 mg  40 mg Intra-articular Once Hilts, Legrand Como, MD         Allergies  Allergen Reactions  . Cephalexin Rash    Objective: Cristian Welch is a pleasant 59 y.o. y.o. Patient  Race: Black or African American [2]  male in NAD. AAO x 3.  There were no vitals filed for this visit.  Vascular Examination: Neurovascular status unchanged b/l. Capillary refill time to digits immediate b/l. Palpable DP pulses b/l. Palpable PT pulses b/l. Pedal hair absent b/l Skin temperature gradient within normal limits b/l. No edema noted b/l.  Dermatological Examination: Pedal skin with normal turgor, texture and tone bilaterally. No open wounds bilaterally. No interdigital macerations bilaterally. Toenails 1-5 b/l elongated, dystrophic, thickened, crumbly with subungual debris and tenderness to dorsal palpation. Well healed longitudinal surgical scar over right 1st MPJ from bunion correction. Hyperkeratotic lesion(s) with minimal build-up L hallux, R hallux, submet head 3 left foot, submet head 3 right foot and submet head 5 right foot.  No erythema, no edema, no drainage, no flocculence.  Musculoskeletal: Normal muscle strength 5/5 to all lower extremity muscle groups bilaterally. No pain crepitus or joint limitation noted with ROM b/l. HAV with bunion deformity left foot. Patient ambulates independent of any assistive aids.  Neurological Examination: Protective sensation intact 5/5 intact bilaterally with 10g monofilament b/l. Vibratory sensation intact b/l.  Assessment: 1. Pain due to onychomycosis of toenails of both feet   2. Controlled type 2 diabetes mellitus without complication, without long-term current use of insulin ([place])    Plan: -Examined patient. -No new findings. No new orders. -Continue diabetic foot care principles. Literature dispensed on today.  -Toenails 1-5 b/l were debrided in length and girth with sterile nail nippers and dremel without iatrogenic bleeding.  -Patient to continue soft, supportive shoe gear daily. -Patient to report any pedal injuries to medical professional immediately. He declines callus paring and is doing well keeping them gently filed  down. -Patient/POA to call should there be question/concern in the interim.  Return in about 10 weeks (around 09/30/2019) for diabetic nail trim.  Marzetta Board, DPM

## 2019-07-26 ENCOUNTER — Ambulatory Visit (AMBULATORY_SURGERY_CENTER): Payer: Medicaid Other | Admitting: Internal Medicine

## 2019-07-26 ENCOUNTER — Other Ambulatory Visit: Payer: Self-pay

## 2019-07-26 ENCOUNTER — Encounter: Payer: Self-pay | Admitting: Internal Medicine

## 2019-07-26 VITALS — BP 117/80 | HR 73 | Temp 96.9°F | Resp 16 | Ht 72.0 in

## 2019-07-26 DIAGNOSIS — D124 Benign neoplasm of descending colon: Secondary | ICD-10-CM

## 2019-07-26 DIAGNOSIS — Z1211 Encounter for screening for malignant neoplasm of colon: Secondary | ICD-10-CM | POA: Diagnosis not present

## 2019-07-26 DIAGNOSIS — D125 Benign neoplasm of sigmoid colon: Secondary | ICD-10-CM

## 2019-07-26 DIAGNOSIS — D120 Benign neoplasm of cecum: Secondary | ICD-10-CM | POA: Diagnosis not present

## 2019-07-26 MED ORDER — SODIUM CHLORIDE 0.9 % IV SOLN: 500.0000 mL | Freq: Once | INTRAVENOUS | Status: DC

## 2019-07-26 NOTE — Progress Notes (Signed)
Report to PACU, RN, vss, BBS= Clear.  

## 2019-07-26 NOTE — Patient Instructions (Signed)
Handout on polyps and diverticulosis given    YOU HAD AN ENDOSCOPIC PROCEDURE TODAY AT THE [facility]:   Refer to the procedure report that was given to you for any specific questions about what was found during the examination.  If the procedure report does not answer your questions, please call your gastroenterologist to clarify.  If you requested that your care partner not be given the details of your procedure findings, then the procedure report has been included in a sealed envelope for you to review at your convenience later.  YOU SHOULD EXPECT: Some feelings of bloating in the abdomen. Passage of more gas than usual.  Walking can help get rid of the air that was put into your GI tract during the procedure and reduce the bloating. If you had a lower endoscopy (such as a colonoscopy or flexible sigmoidoscopy) you may notice spotting of blood in your stool or on the toilet paper. If you underwent a bowel prep for your procedure, you may not have a normal bowel movement for a few days.  Please Note:  You might notice some irritation and congestion in your nose or some drainage.  This is from the oxygen used during your procedure.  There is no need for concern and it should clear up in a day or so.  SYMPTOMS TO REPORT IMMEDIATELY:   Following lower endoscopy (colonoscopy or flexible sigmoidoscopy):  Excessive amounts of blood in the stool  Significant tenderness or worsening of abdominal pains  Swelling of the abdomen that is new, acute  Fever of 100F or higher   For urgent or emergent issues, a gastroenterologist can be reached at any hour by calling [phone]. Do not use MyChart messaging for urgent concerns.    DIET:  We do recommend a small meal at first, but then you may proceed to your regular diet.  Drink plenty of fluids but you should avoid alcoholic beverages for 24 hours.  ACTIVITY:  You should plan to take it easy for the rest of today and you should NOT  DRIVE or use heavy machinery until tomorrow (because of the sedation medicines used during the test).    FOLLOW UP: Our staff will call the number listed on your records 48-72 hours following your procedure to check on you and address any questions or concerns that you may have regarding the information given to you following your procedure. If we do not reach you, we will leave a message.  We will attempt to reach you two times.  During this call, we will ask if you have developed any symptoms of COVID 19. If you develop any symptoms (ie: fever, flu-like symptoms, shortness of breath, cough etc.) before then, please call [phone].  If you test positive for Covid 19 in the 2 weeks post procedure, please call and report this information to us.    If any biopsies were taken you will be contacted by phone or by letter within the next 1-3 weeks.  Please call us at [phone] if you have not heard about the biopsies in 3 weeks.    SIGNATURES/CONFIDENTIALITY: You and/or your care partner have signed paperwork which will be entered into your electronic medical record.  These signatures attest to the fact that that the information above on your After Visit Summary has been reviewed and is understood.  Full responsibility of the confidentiality of this discharge information lies with you and/or your care-partner. 

## 2019-07-26 NOTE — Op Note (Signed)
[place] Patient Name: Cristian Welch Procedure Date: 07/26/2019 9:33 AM MRN: [id number] Endoscopist: Jerene Bears , MD Age: 59 Referring MD:  Date of Birth: December 23, 1960 Gender: Male Account #: [id number] Procedure:                Colonoscopy Indications:              Screening for colorectal malignant neoplasm, Last                            colonoscopy 10 years ago (Dr. Penelope Coop) Medicines:                Monitored Anesthesia Care Procedure:                Pre-Anesthesia Assessment:                           - Prior to the procedure, a History and Physical                            was performed, and patient medications and                            allergies were reviewed. The patient's tolerance of                            previous anesthesia was also reviewed. The risks                            and benefits of the procedure and the sedation                            options and risks were discussed with the patient.                            All questions were answered, and informed consent                            was obtained. Prior Anticoagulants: The patient has                            taken no previous anticoagulant or antiplatelet                            agents. ASA Grade Assessment: III - A patient with                            severe systemic disease. After reviewing the risks                            and benefits, the patient was deemed in                            satisfactory condition to undergo the procedure.  After obtaining informed consent, the colonoscope                            was passed under direct vision. Throughout the                            procedure, the patient's blood pressure, pulse, and                            oxygen saturations were monitored continuously. The                            Colonoscope was introduced through the anus and                            advanced to the cecum,  identified by appendiceal                            orifice and ileocecal valve. The colonoscopy was                            performed without difficulty. The patient tolerated                            the procedure well. The quality of the bowel                            preparation was good. The terminal ileum, ileocecal                            valve, appendiceal orifice, and rectum were                            photographed. Scope In: 9:44:40 AM Scope Out: 9:57:05 AM Scope Withdrawal Time: 0 hours 11 minutes 6 seconds  Total Procedure Duration: 0 hours 12 minutes 25 seconds  Findings:                 The digital rectal exam was normal.                           A 3 mm polyp was found in the cecum. The polyp was                            sessile. The polyp was removed with a cold snare.                            Resection and retrieval were complete.                           A 5 mm polyp was found in the descending colon. The                            polyp was sessile. The polyp was  removed with a                            cold snare. Resection and retrieval were complete.                           A 4 mm polyp was found in the sigmoid colon. The                            polyp was sessile. The polyp was removed with a                            cold snare. Resection and retrieval were complete.                           Multiple small and large-mouthed diverticula were                            found in the sigmoid colon, descending colon and                            hepatic flexure.                           The retroflexed view of the distal rectum and anal                            verge was normal and showed no anal or rectal                            abnormalities. Complications:            No immediate complications. Estimated Blood Loss:     Estimated blood loss was minimal. Impression:               - One 3 mm polyp in the cecum, removed with a cold                             snare. Resected and retrieved.                           - One 5 mm polyp in the descending colon, removed                            with a cold snare. Resected and retrieved.                           - One 4 mm polyp in the sigmoid colon, removed with                            a cold snare. Resected and retrieved.                           - Diverticulosis in the sigmoid colon, in the  descending colon and at the hepatic flexure.                           - The distal rectum and anal verge are normal on                            retroflexion view. Recommendation:           - Patient has a contact number available for                            emergencies. The signs and symptoms of potential                            delayed complications were discussed with the                            patient. Return to normal activities tomorrow.                            Written discharge instructions were provided to the                            patient.                           - Resume previous diet.                           - Continue present medications.                           - Await pathology results.                           - Repeat colonoscopy is recommended. The                            colonoscopy date will be determined after pathology                            results from today's exam become available for                            review. Jerene Bears, MD 07/26/2019 10:05:42 AM This report has been signed electronically.

## 2019-07-26 NOTE — Progress Notes (Signed)
Pt's states no medical or surgical changes since previsit or office visit. 

## 2019-07-28 ENCOUNTER — Telehealth: Payer: Self-pay | Admitting: *Deleted

## 2019-07-28 NOTE — Telephone Encounter (Signed)
°  Follow up Call-  Call back number 07/26/2019  Post procedure Call Back phone  # [phone]  Permission to leave phone message Yes  Some recent data might be hidden     Patient questions:  Do you have a fever, pain , or abdominal swelling? No. Pain Score  0 *  Have you tolerated food without any problems? Yes.    Have you been able to return to your normal activities? Yes.    Do you have any questions about your discharge instructions: Diet   No. Medications  No. Follow up visit  No.  Do you have questions or concerns about your Care? No.  Actions: * If pain score is 4 or above: No action needed, pain <4.  1. Have you developed a fever since your procedure? no  2.   Have you had an respiratory symptoms (SOB or cough) since your procedure? no  3.   Have you tested positive for COVID 19 since your procedure no  4.   Have you had any family members/close contacts diagnosed with the COVID 19 since your procedure?  no   If yes to any of these questions please route to Joylene John, RN and Erenest Rasher, RN

## 2019-07-31 ENCOUNTER — Encounter: Payer: Self-pay | Admitting: Internal Medicine

## 2019-09-30 ENCOUNTER — Encounter: Payer: Self-pay | Admitting: Podiatry

## 2019-09-30 ENCOUNTER — Ambulatory Visit (INDEPENDENT_AMBULATORY_CARE_PROVIDER_SITE_OTHER): Payer: Medicaid Other | Admitting: Podiatry

## 2019-09-30 ENCOUNTER — Other Ambulatory Visit: Payer: Self-pay

## 2019-09-30 DIAGNOSIS — M79675 Pain in left toe(s): Secondary | ICD-10-CM | POA: Diagnosis not present

## 2019-09-30 DIAGNOSIS — E119 Type 2 diabetes mellitus without complications: Secondary | ICD-10-CM

## 2019-09-30 DIAGNOSIS — B351 Tinea unguium: Secondary | ICD-10-CM | POA: Diagnosis not present

## 2019-09-30 DIAGNOSIS — M79674 Pain in right toe(s): Secondary | ICD-10-CM

## 2019-10-01 NOTE — Progress Notes (Signed)
Subjective: Ahmon Tosi presents today preventative diabetic foot care and painful mycotic nails b/l that are difficult to trim. Pain interferes with ambulation. Aggravating factors include wearing enclosed shoe gear. Pain is relieved with periodic professional debridement.   He voices no new pedal problems on today's visit. He continues to file his calluses between visits.  Nolene Ebbs, MD is patient's PCP.  Past Medical History:  Diagnosis Date  . Allergic rhinitis   . Arthritis   . At risk for sleep apnea    STOP-BANG= 4    SENT TO PCP 11-28-2013  . Exercise-induced asthma   . GERD (gastroesophageal reflux disease)   . Glaucoma   . History of MRSA infection    05/ 2014--  POSITIVE MRSA CULTURE SCROTAL ABSCESS  . Prostate cancer [facility])    s/p  prostatectomy 08/ 2014  . Skin tag of anus   . Type 2 diabetes mellitus ([place])      Current Outpatient Medications on File Prior to Visit  Medication Sig Dispense Refill  . acetaminophen (TYLENOL) 500 MG tablet Take 2 tablets (1,000 mg total) by mouth every 6 (six) hours as needed. 30 tablet 0  . acyclovir (ZOVIRAX) 400 MG tablet Take 1 tablet (400 mg total) by mouth 4 (four) times daily. (Patient not taking: Reported on 07/26/2019) 40 tablet 0  . albuterol (PROVENTIL HFA;VENTOLIN HFA) 108 (90 BASE) MCG/ACT inhaler Inhale 2 puffs into the lungs every 6 (six) hours as needed for wheezing.    Marland Kitchen amoxicillin-clavulanate (AUGMENTIN) 875-125 MG tablet Take 1 tablet by mouth 2 (two) times daily. (Patient not taking: Reported on 07/26/2019) 20 tablet 0  . AZOPT 1 % ophthalmic suspension INSTILL 1 DROP INTO AFFECTED EYE(S) THREE TIMES DAILY  5  . cetirizine (ZYRTEC) 10 MG tablet Take 10 mg by mouth daily.    . ciprofloxacin (CIPRO) 500 MG tablet Take 1 tablet (500 mg total) by mouth 2 (two) times daily. (Patient not taking: Reported on 07/26/2019) 20 tablet 0  . cyclobenzaprine (FLEXERIL) 10 MG tablet Take 1 tablet (10 mg total) by mouth 2 (two) times  daily as needed for muscle spasms. (Patient not taking: Reported on 07/26/2019) 20 tablet 0  . diclofenac (VOLTAREN) 75 MG EC tablet TAKE 1 TABLET BY MOUTH TWICE DAILY WITH FOOD AS NEEDED    . DORZOLAMIDE HCL OP Apply 1 drop to eye 3 (three) times daily.    Marland Kitchen FARXIGA 10 MG TABS tablet Take 10 mg by mouth daily.    . fluticasone (FLONASE) 50 MCG/ACT nasal spray Place 1 spray into both nostrils daily. (Patient not taking: Reported on 07/26/2019) 16 g 0  . glimepiride (AMARYL) 4 MG tablet Take 4 mg by mouth 2 (two) times daily.    Marland Kitchen HYDROcodone-acetaminophen (NORCO/VICODIN) 5-325 MG tablet Take 1 tablet by mouth every 6 (six) hours as needed for moderate pain. (Patient not taking: Reported on 07/26/2019) 12 tablet 0  . INVOKANA 100 MG TABS tablet Take 100 mg by mouth daily.  5  . lovastatin (MEVACOR) 20 MG tablet Take 20 mg by mouth every morning.    Marland Kitchen LUMIGAN 0.01 % SOLN INSTILL 1 DROP INTO EACH EYE IN THE EVENING SEPARATE BY AT LEAST 10 MINUTES FROM OTHER INTRAOCULAR PRESSURE REDUCING OPHTHALMIC DRUGS    . metFORMIN (GLUCOPHAGE) 1000 MG tablet Take 1,000 mg by mouth 2 (two) times daily.  5  . methocarbamol (ROBAXIN) 500 MG tablet Take 1 tablet (500 mg total) by mouth every 8 (eight) hours as needed for  muscle spasms. (Patient not taking: Reported on 07/12/2019) 30 tablet 0  . MOBIC 7.5 MG tablet Take 7.5 mg by mouth daily.    . naproxen (NAPROSYN) 500 MG tablet Take 1 tablet (500 mg total) by mouth 2 (two) times daily. (Patient not taking: Reported on 07/12/2019) 20 tablet 0  . oxyCODONE-acetaminophen (PERCOCET) 5-325 MG tablet Take 1 tablet by mouth every 6 (six) hours as needed for severe pain. (Patient not taking: Reported on 07/12/2019) 20 tablet 0  . propranolol (INDERAL) 10 MG tablet SMARTSIG:1 Tablet(s) By Mouth Every Evening    . tiZANidine (ZANAFLEX) 4 MG tablet Take 4 mg by mouth 2 (two) times daily as needed.    . travoprost, benzalkonium, (TRAVATAN) 0.004 % ophthalmic solution Place 1 drop into  both eyes at bedtime.    . Vitamin D, Ergocalciferol, (DRISDOL) 1.25 MG (50000 UNIT) CAPS capsule Take 50,000 Units by mouth once a week.     Current Facility-Administered Medications on File Prior to Visit  Medication Dose Route Frequency Provider Last Rate Last Admin  . methylPREDNISolone acetate (DEPO-MEDROL) injection 40 mg  40 mg Intra-articular Once Hilts, Legrand Como, MD         Allergies  Allergen Reactions  . Cephalexin Rash    Objective: Woods Gangemi is a pleasant 59 y.o. African American male, WD, WN in NAD. AAO x 3.  There were no vitals filed for this visit.  Vascular Examination: Neurovascular status unchanged b/l. Capillary refill time to digits immediate b/l. Palpable DP pulses b/l. Palpable PT pulses b/l. Pedal hair absent b/l Skin temperature gradient within normal limits b/l. No edema noted b/l.  Dermatological Examination: Pedal skin with normal turgor, texture and tone bilaterally. No open wounds bilaterally. No interdigital macerations bilaterally. Toenails 1-5 b/l elongated, dystrophic, thickened, crumbly with subungual debris and tenderness to dorsal palpation. Well healed longitudinal surgical scar over right 1st MPJ from bunion correction. Hyperkeratotic lesion(s) with minimal build-up L hallux, R hallux, submet head 3 left foot, submet head 3 right foot and submet head 5 right foot.  No erythema, no edema, no drainage, no flocculence.  Musculoskeletal: Normal muscle strength 5/5 to all lower extremity muscle groups bilaterally. No pain crepitus or joint limitation noted with ROM b/l. HAV with bunion deformity left foot. Patient ambulates independent of any assistive aids.  Neurological Examination: Protective sensation intact 5/5 intact bilaterally with 10g monofilament b/l. Vibratory sensation intact b/l.  Assessment: 1. Pain due to onychomycosis of toenails of both feet   2. Controlled type 2 diabetes mellitus without complication, without long-term current  use of insulin ([place])     Plan: -Examined patient. -No new findings. No new orders. -Continue diabetic foot care principles. Literature dispensed on today.  -Toenails 1-5 b/l were debrided in length and girth with sterile nail nippers and dremel without iatrogenic bleeding.  -Patient to continue soft, supportive shoe gear daily. -Patient to report any pedal injuries to medical professional immediately. He declines callus paring and is doing well keeping them gently filed down. -Patient/POA to call should there be question/concern in the interim.  Return in about 3 months (around 12/31/2019) for diabetic nail trim.  Marzetta Board, DPM

## 2020-01-02 ENCOUNTER — Ambulatory Visit: Payer: Medicaid Other | Admitting: Podiatry

## 2020-03-06 ENCOUNTER — Encounter: Payer: Self-pay | Admitting: Podiatry

## 2020-03-06 ENCOUNTER — Ambulatory Visit (INDEPENDENT_AMBULATORY_CARE_PROVIDER_SITE_OTHER): Payer: Medicaid Other | Admitting: Podiatry

## 2020-03-06 ENCOUNTER — Other Ambulatory Visit: Payer: Self-pay

## 2020-03-06 DIAGNOSIS — L84 Corns and callosities: Secondary | ICD-10-CM | POA: Diagnosis not present

## 2020-03-06 DIAGNOSIS — E119 Type 2 diabetes mellitus without complications: Secondary | ICD-10-CM

## 2020-03-06 DIAGNOSIS — M79675 Pain in left toe(s): Secondary | ICD-10-CM

## 2020-03-06 DIAGNOSIS — M2012 Hallux valgus (acquired), left foot: Secondary | ICD-10-CM

## 2020-03-06 DIAGNOSIS — M79674 Pain in right toe(s): Secondary | ICD-10-CM

## 2020-03-06 DIAGNOSIS — B351 Tinea unguium: Secondary | ICD-10-CM | POA: Diagnosis not present

## 2020-03-09 NOTE — Progress Notes (Signed)
Subjective:  Patient ID: Cristian Welch, male    DOB: 06-25-1960,  MRN: [id number]  60 y.o. male presents with preventative diabetic foot care and callus(es) b/l and painful thick toenails that are difficult to trim. Painful toenails interfere with ambulation. Aggravating factors include wearing enclosed shoe gear. Pain is relieved with periodic professional debridement. Painful calluses are aggravated when weightbearing with and without shoegear. Pain is relieved with periodic professional debridement..    Patient's blood sugar was 145 mg/dl this morning. He states he ate a honey bun last night.  PCP: Nolene Ebbs, MD and last visit was: 2 months ago.  Review of Systems: Negative except as noted in the HPI.  Past Medical History:  Diagnosis Date  . Allergic rhinitis   . Arthritis   . At risk for sleep apnea    STOP-BANG= 4    SENT TO PCP 11-28-2013  . Exercise-induced asthma   . GERD (gastroesophageal reflux disease)   . Glaucoma   . History of MRSA infection    05/ 2014--  POSITIVE MRSA CULTURE SCROTAL ABSCESS  . Prostate cancer [facility])    s/p  prostatectomy 08/ 2014  . Skin tag of anus   . Type 2 diabetes mellitus ([place])    Past Surgical History:  Procedure Laterality Date  . CIRCUMCISION N/A 08/04/2012   Procedure: CIRCUMCISION ADULT;  Surgeon: Molli Hazard, MD;  Location: [facility];  Service: Urology;  Laterality: N/A;  90 MIN ALSO PROSTATE NERVE BLOCK   . EXCISION OF SKIN TAG N/A 12/01/2013   Procedure: EXCISION OF ANAL SKIN TAG;  Surgeon: Leighton Ruff, MD;  Location: [facility];  Service: General;  Laterality: N/A;  . FOOT SURGERY Left 1994 (approx)  . INGUINAL HERNIA REPAIR Right 2000 (approx)  . IRRIGATION AND DEBRIDEMENT ABSCESS Right 07/07/2012   Procedure: IRRIGATION AND DEBRIDEMENT ABSCESS;  Surgeon: Molli Hazard, MD;  Location: [facility];  Service: Urology;  Laterality: Right;  and scrotal abcesses  .  LYMPHADENECTOMY Bilateral 10/06/2012   Procedure: WITH BILATERAL PELVIC LYMPH NODE DISSECTION ;  Surgeon: Molli Hazard, MD;  Location: [facility];  Service: Urology;  Laterality: Bilateral;  . PROSTATE BIOPSY N/A 08/04/2012   Procedure: BIOPSY TRANSRECTAL ULTRASONIC PROSTATE (TUBP);  Surgeon: Molli Hazard, MD;  Location: [facility];  Service: Urology;  Laterality: N/A;  . ROBOT ASSISTED LAPAROSCOPIC RADICAL PROSTATECTOMY N/A 10/06/2012   Procedure: ROBOTIC ASSISTED LAPAROSCOPIC RADICAL PROSTATECTOMY LEVEL 2;  Surgeon: Molli Hazard, MD;  Location: [facility];  Service: Urology;  Laterality: N/A;  . TRANSTHORACIC ECHOCARDIOGRAM  06-06-2009   NORMAL LVF AND LVSF/ EF 55-60%   Patient Active Problem List   Diagnosis Date Noted  . Hemorrhoids 05/13/2012  . ONYCHOMYCOSIS, TOENAILS 11/06/2009  . 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 ELEVATED BP READING WITHOUT DX HYPERTENSION 11/06/2009  . ELEVATED PROSTATE SPECIFIC ANTIGEN 09/18/2009  . DIABETES MELLITUS, TYPE II 08/06/2009  . GLYCOSURIA 08/06/2009  . VENTRAL HERNIA 05/07/2009  . RECTAL BLEEDING 05/07/2009  . DYSPNEA ON EXERTION 05/07/2009    Current Outpatient Medications:  .  ACCU-CHEK GUIDE test strip, , Disp: , Rfl:  .  acetaminophen (TYLENOL) 500 MG tablet, Take 2 tablets (1,000 mg total) by mouth every 6 (six) hours as needed., Disp: 30 tablet, Rfl: 0 .  acyclovir (ZOVIRAX) 400 MG tablet, Take 1 tablet (400 mg total) by mouth 4 (four) times daily. (Patient not taking: Reported on 07/26/2019), Disp: 40 tablet, Rfl: 0 .  albuterol (  PROVENTIL HFA;VENTOLIN HFA) 108 (90 BASE) MCG/ACT inhaler, Inhale 2 puffs into the lungs every 6 (six) hours as needed for wheezing., Disp: , Rfl:  .  amoxicillin-clavulanate (AUGMENTIN) 875-125 MG tablet, Take 1 tablet by mouth 2 (two) times daily. (Patient not taking: Reported on 07/26/2019), Disp: 20 tablet, Rfl: 0 .  AZOPT 1 % ophthalmic suspension, INSTILL 1 DROP INTO AFFECTED EYE(S)  THREE TIMES DAILY, Disp: , Rfl: 5 .  cetirizine (ZYRTEC) 10 MG tablet, Take 10 mg by mouth daily., Disp: , Rfl:  .  ciprofloxacin (CIPRO) 500 MG tablet, Take 1 tablet (500 mg total) by mouth 2 (two) times daily. (Patient not taking: Reported on 07/26/2019), Disp: 20 tablet, Rfl: 0 .  cyclobenzaprine (FLEXERIL) 10 MG tablet, Take 1 tablet (10 mg total) by mouth 2 (two) times daily as needed for muscle spasms. (Patient not taking: Reported on 07/26/2019), Disp: 20 tablet, Rfl: 0 .  diclofenac (VOLTAREN) 75 MG EC tablet, TAKE 1 TABLET BY MOUTH TWICE DAILY WITH FOOD AS NEEDED, Disp: , Rfl:  .  DORZOLAMIDE HCL OP, Apply 1 drop to eye 3 (three) times daily., Disp: , Rfl:  .  FARXIGA 10 MG TABS tablet, Take 10 mg by mouth daily., Disp: , Rfl:  .  fluticasone (FLONASE) 50 MCG/ACT nasal spray, Place 1 spray into both nostrils daily. (Patient not taking: Reported on 07/26/2019), Disp: 16 g, Rfl: 0 .  glimepiride (AMARYL) 4 MG tablet, Take 4 mg by mouth 2 (two) times daily., Disp: , Rfl:  .  HYDROcodone-acetaminophen (NORCO/VICODIN) 5-325 MG tablet, Take 1 tablet by mouth every 6 (six) hours as needed for moderate pain. (Patient not taking: Reported on 07/26/2019), Disp: 12 tablet, Rfl: 0 .  INVOKANA 100 MG TABS tablet, Take 100 mg by mouth daily., Disp: , Rfl: 5 .  lovastatin (MEVACOR) 20 MG tablet, Take 20 mg by mouth every morning., Disp: , Rfl:  .  LUMIGAN 0.01 % SOLN, INSTILL 1 DROP INTO EACH EYE IN THE EVENING SEPARATE BY AT LEAST 10 MINUTES FROM OTHER INTRAOCULAR PRESSURE REDUCING OPHTHALMIC DRUGS, Disp: , Rfl:  .  metFORMIN (GLUCOPHAGE) 1000 MG tablet, Take 1,000 mg by mouth 2 (two) times daily., Disp: , Rfl: 5 .  methocarbamol (ROBAXIN) 500 MG tablet, Take 1 tablet (500 mg total) by mouth every 8 (eight) hours as needed for muscle spasms. (Patient not taking: Reported on 07/12/2019), Disp: 30 tablet, Rfl: 0 .  MOBIC 7.5 MG tablet, Take 7.5 mg by mouth daily., Disp: , Rfl:  .  naproxen (NAPROSYN) 500 MG tablet,  Take 1 tablet (500 mg total) by mouth 2 (two) times daily. (Patient not taking: Reported on 07/12/2019), Disp: 20 tablet, Rfl: 0 .  oxyCODONE-acetaminophen (PERCOCET) 5-325 MG tablet, Take 1 tablet by mouth every 6 (six) hours as needed for severe pain. (Patient not taking: Reported on 07/12/2019), Disp: 20 tablet, Rfl: 0 .  propranolol (INDERAL) 10 MG tablet, SMARTSIG:1 Tablet(s) By Mouth Every Evening, Disp: , Rfl:  .  tiZANidine (ZANAFLEX) 4 MG tablet, Take 4 mg by mouth 2 (two) times daily as needed., Disp: , Rfl:  .  travoprost, benzalkonium, (TRAVATAN) 0.004 % ophthalmic solution, Place 1 drop into both eyes at bedtime., Disp: , Rfl:  .  Vitamin D, Ergocalciferol, (DRISDOL) 1.25 MG (50000 UNIT) CAPS capsule, Take 50,000 Units by mouth once a week., Disp: , Rfl:   Current Facility-Administered Medications:  .  methylPREDNISolone acetate (DEPO-MEDROL) injection 40 mg, 40 mg, Intra-articular, Once, Hilts, Michael, MD Allergies  Allergen  Reactions  . Cephalexin Rash   Social History   Tobacco Use  Smoking Status Former Smoker  . Packs/day: 0.25  . Years: 20.00  . Pack years: 5.00  . Types: Cigarettes  . Quit date: 02/24/2002  . Years since quitting: 18.0  Smokeless Tobacco Never Used    Objective:  There were no vitals filed for this visit. Constitutional Patient is a pleasant 60 y.o. African American male obese in NAD. AAO x 3.  Vascular Capillary refill time to digits immediate b/l. Palpable pedal pulses b/l LE. Pedal hair absent. Lower extremity skin temperature gradient within normal limits. No pain with calf compression b/l. No edema noted b/l lower extremities. No cyanosis or clubbing noted.  Neurologic Normal speech. Protective sensation intact 5/5 intact bilaterally with 10g monofilament b/l. Vibratory sensation intact b/l. Proprioception intact bilaterally.  Dermatologic Pedal skin with normal turgor, texture and tone bilaterally. No open wounds bilaterally. No interdigital  macerations bilaterally. Toenails 1-5 b/l elongated, discolored, dystrophic, thickened, crumbly with subungual debris and tenderness to dorsal palpation. Hyperkeratotic lesion(s) L hallux, R hallux, submet head 3 left foot, submet head 3 right foot and submet head 5 right foot.  No erythema, no edema, no drainage, no fluctuance.  Orthopedic: Normal muscle strength 5/5 to all lower extremity muscle groups bilaterally. No pain crepitus or joint limitation noted with ROM b/l. Hallux valgus with bunion deformity noted left foot.   No flowsheet data found.     Assessment:   1. Pain due to onychomycosis of toenails of both feet   2. Callus   3. Hallux abductovalgus, acquired, left   4. Controlled type 2 diabetes mellitus without complication, without long-term current use of insulin ([place])    Plan:  Patient was evaluated and treated and all questions answered.  Onychomycosis with pain -Nails palliatively debridement as below. -Educated on self-care  Procedure: Nail Debridement Rationale: Pain Type of Debridement: manual, sharp debridement. Instrumentation: Nail nipper, rotary burr. Number of Nails: 10  -Examined patient. -Medicaid ABN signed for this year. Patient consents for services of paring of calluses >4, today. Copy given to patient on today's visit and copy placed in patient chart.  -Continue diabetic foot care principles. -Patient to continue soft, supportive shoe gear daily. -Toenails 1-5 b/l were debrided in length and girth with sterile nail nippers and dremel without iatrogenic bleeding.  -Callus(es) L hallux, R hallux, submet head 3 left foot, submet head 3 right foot and submet head 5 right foot pared utilizing sterile scalpel blade without complication or incident. Total number debrided =5. -Patient to report any pedal injuries to medical professional immediately. -Patient/POA to call should there be question/concern in the interim.  Return in about 3 months (around  06/04/2020) for diabetic foot care.  Marzetta Board, DPM

## 2020-03-13 ENCOUNTER — Other Ambulatory Visit: Payer: Self-pay | Admitting: Internal Medicine

## 2020-03-14 LAB — CBC
HCT: 47.1 % (ref 38.5–50.0)
Hemoglobin: 16.1 g/dL (ref 13.2–17.1)
MCH: 29.7 pg (ref 27.0–33.0)
MCHC: 34.2 g/dL (ref 32.0–36.0)
MCV: 86.7 fL (ref 80.0–100.0)
MPV: 12.4 fL (ref 7.5–12.5)
Platelets: 194 10*3/uL (ref 140–400)
RBC: 5.43 10*6/uL (ref 4.20–5.80)
RDW: 12.7 % (ref 11.0–15.0)
WBC: 6 10*3/uL (ref 3.8–10.8)

## 2020-03-14 LAB — COMPLETE METABOLIC PANEL WITH GFR
AG Ratio: 1.8 (calc) (ref 1.0–2.5)
ALT: 41 U/L (ref 9–46)
AST: 31 U/L (ref 10–35)
Albumin: 4.4 g/dL (ref 3.6–5.1)
Alkaline phosphatase (APISO): 55 U/L (ref 35–144)
BUN: 16 mg/dL (ref 7–25)
CO2: 23 mmol/L (ref 20–32)
Calcium: 9.6 mg/dL (ref 8.6–10.3)
Chloride: 106 mmol/L (ref 98–110)
Creat: 0.75 mg/dL (ref 0.70–1.25)
GFR, Est African American: 115 mL/min/{1.73_m2} (ref >=60)
GFR, Est Non African American: 99 mL/min/{1.73_m2} (ref >=60)
Globulin: 2.5 g/dL (calc) (ref 1.9–3.7)
Glucose, Bld: 137 mg/dL — ABNORMAL HIGH (ref 65–99)
Potassium: 4.2 mmol/L (ref 3.5–5.3)
Sodium: 139 mmol/L (ref 135–146)
Total Bilirubin: 0.4 mg/dL (ref 0.2–1.2)
Total Protein: 6.9 g/dL (ref 6.1–8.1)

## 2020-03-14 LAB — LIPID PANEL
Cholesterol: 155 mg/dL (ref <200)
HDL: 49 mg/dL (ref >=40)
LDL Cholesterol (Calc): 91 mg/dL (calc)
Non-HDL Cholesterol (Calc): 106 mg/dL (calc) (ref <130)
Total CHOL/HDL Ratio: 3.2 (calc) (ref <5.0)
Triglycerides: 68 mg/dL (ref <150)

## 2020-03-14 LAB — VITAMIN D 25 HYDROXY (VIT D DEFICIENCY, FRACTURES): Vit D, 25-Hydroxy: 97 ng/mL (ref 30–100)

## 2020-03-14 LAB — TSH: TSH: 2.01 mIU/L (ref 0.40–4.50)

## 2020-05-17 ENCOUNTER — Emergency Department (HOSPITAL_COMMUNITY)
Admission: EM | Admit: 2020-05-17 | Discharge: 2020-05-17 | Disposition: A | Discharge status: Home / Self Care | Payer: Medicaid Other | Attending: Emergency Medicine | Admitting: Emergency Medicine

## 2020-05-17 ENCOUNTER — Emergency Department (HOSPITAL_COMMUNITY): Payer: Medicaid Other

## 2020-05-17 ENCOUNTER — Encounter (HOSPITAL_COMMUNITY): Payer: Self-pay | Admitting: Emergency Medicine

## 2020-05-17 DIAGNOSIS — Z7984 Long term (current) use of oral hypoglycemic drugs: Secondary | ICD-10-CM | POA: Diagnosis not present

## 2020-05-17 DIAGNOSIS — Z87891 Personal history of nicotine dependence: Secondary | ICD-10-CM | POA: Diagnosis not present

## 2020-05-17 DIAGNOSIS — Z8546 Personal history of malignant neoplasm of prostate: Secondary | ICD-10-CM | POA: Insufficient documentation

## 2020-05-17 DIAGNOSIS — Z79899 Other long term (current) drug therapy: Secondary | ICD-10-CM | POA: Insufficient documentation

## 2020-05-17 DIAGNOSIS — R519 Headache, unspecified: Secondary | ICD-10-CM | POA: Insufficient documentation

## 2020-05-17 DIAGNOSIS — E119 Type 2 diabetes mellitus without complications: Secondary | ICD-10-CM | POA: Insufficient documentation

## 2020-05-17 MED ORDER — ACETAMINOPHEN 500 MG PO TABS
1000.0000 mg | ORAL_TABLET | Freq: Once | ORAL | Status: AC
  Administered 2020-05-17: 1000 mg via ORAL
  Filled 2020-05-17: qty 2

## 2020-05-17 MED ORDER — IBUPROFEN 400 MG PO TABS
400.0000 mg | ORAL_TABLET | Freq: Once | ORAL | Status: AC
  Administered 2020-05-17: 400 mg via ORAL
  Filled 2020-05-17: qty 1

## 2020-05-17 NOTE — ED Triage Notes (Signed)
Patient complains of persistent headache for the last two days, patient believes caused by sinus infection. Patient reports taking claritin and ibuprofen with minimal relief. Patient alert, oriented, ambulatory, and in no apparent distress at this time.

## 2020-05-17 NOTE — ED Provider Notes (Signed)
[place] EMERGENCY DEPARTMENT Provider Note   CSN: [id number] Arrival date & time: 05/17/20  1627     History Chief Complaint  Patient presents with  . Headache    Cristian Welch is a 60 y.o. male.  Patient c/o frontal headache. Symptoms gradual onset, constant, persistent, dull, non radiating. Denies hx frequent or chronic headaches. No hx migraines. +sinus congestion. No sore throat. No fever or chills. Denies ear pain or hearing loss, no tinnitus. No eye pain, redness, tearing, or change in vision. No recent head injury, trauma or fall. No syncope. No neck pain or stiffness. No specific exacerbating or alleviating factors, no change w head position or time of day. No associated numbness, weakness, change in speech or vision, or change in normal functional ability.   The history is provided by the patient.  Headache Associated symptoms: no abdominal pain, no cough, no eye pain, no fever, no nausea, no neck pain, no neck stiffness, no numbness, no photophobia, no sore throat, no vomiting and no weakness        Past Medical History:  Diagnosis Date  . Allergic rhinitis   . Arthritis   . At risk for sleep apnea    STOP-BANG= 4    SENT TO PCP 11-28-2013  . Exercise-induced asthma   . GERD (gastroesophageal reflux disease)   . Glaucoma   . History of MRSA infection    05/ 2014--  POSITIVE MRSA CULTURE SCROTAL ABSCESS  . Prostate cancer [facility])    s/p  prostatectomy 08/ 2014  . Skin tag of anus   . Type 2 diabetes mellitus [facility])     Patient Active Problem List   Diagnosis Date Noted  . Hemorrhoids 05/13/2012  . ONYCHOMYCOSIS, TOENAILS 11/06/2009  . 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 ELEVATED BP READING WITHOUT DX HYPERTENSION 11/06/2009  . ELEVATED PROSTATE SPECIFIC ANTIGEN 09/18/2009  . DIABETES MELLITUS, TYPE II 08/06/2009  . GLYCOSURIA 08/06/2009  . VENTRAL HERNIA 05/07/2009  . RECTAL BLEEDING 05/07/2009  . DYSPNEA ON EXERTION 05/07/2009    Past  Surgical History:  Procedure Laterality Date  . CIRCUMCISION N/A 08/04/2012   Procedure: CIRCUMCISION ADULT;  Surgeon: Molli Hazard, MD;  Location: [facility];  Service: Urology;  Laterality: N/A;  90 MIN ALSO PROSTATE NERVE BLOCK   . EXCISION OF SKIN TAG N/A 12/01/2013   Procedure: EXCISION OF ANAL SKIN TAG;  Surgeon: Leighton Ruff, MD;  Location: [place];  Service: General;  Laterality: N/A;  . FOOT SURGERY Left 1994 (approx)  . INGUINAL HERNIA REPAIR Right 2000 (approx)  . IRRIGATION AND DEBRIDEMENT ABSCESS Right 07/07/2012   Procedure: IRRIGATION AND DEBRIDEMENT ABSCESS;  Surgeon: Molli Hazard, MD;  Location: [facility];  Service: Urology;  Laterality: Right;  and scrotal abcesses  . LYMPHADENECTOMY Bilateral 10/06/2012   Procedure: WITH BILATERAL PELVIC LYMPH NODE DISSECTION ;  Surgeon: Molli Hazard, MD;  Location: [facility];  Service: Urology;  Laterality: Bilateral;  . PROSTATE BIOPSY N/A 08/04/2012   Procedure: BIOPSY TRANSRECTAL ULTRASONIC PROSTATE (TUBP);  Surgeon: Molli Hazard, MD;  Location: [facility];  Service: Urology;  Laterality: N/A;  . ROBOT ASSISTED LAPAROSCOPIC RADICAL PROSTATECTOMY N/A 10/06/2012   Procedure: ROBOTIC ASSISTED LAPAROSCOPIC RADICAL PROSTATECTOMY LEVEL 2;  Surgeon: Molli Hazard, MD;  Location: [facility];  Service: Urology;  Laterality: N/A;  . TRANSTHORACIC ECHOCARDIOGRAM  06-06-2009   NORMAL LVF AND LVSF/ EF 55-60%       Family  History  Problem Relation Age of Onset  . Colon cancer Neg Hx   . Esophageal cancer Neg Hx   . Stomach cancer Neg Hx   . Rectal cancer Neg Hx     Social History   Tobacco Use  . Smoking status: Former Smoker    Packs/day: 0.25    Years: 20.00    Pack years: 5.00    Types: Cigarettes    Quit date: 02/24/2002    Years since quitting: 18.2  . Smokeless tobacco: Never Used  Substance Use Topics  . Alcohol use: No  .  Drug use: No    Types: Cocaine, Marijuana, LSD    Comment: last used (approx.  2000)    Home Medications Prior to Admission medications   Medication Sig Start Date End Date Taking? Authorizing Provider  ACCU-CHEK GUIDE test strip  11/07/19   [provider]  acetaminophen (TYLENOL) 500 MG tablet Take 2 tablets (1,000 mg total) by mouth every 6 (six) hours as needed. 02/10/18   McDonald, Mia A, PA-C  acyclovir (ZOVIRAX) 400 MG tablet Take 1 tablet (400 mg total) by mouth 4 (four) times daily. Patient not taking: Reported on 07/26/2019 02/06/15   Deno Etienne, DO  albuterol (PROVENTIL HFA;VENTOLIN HFA) 108 (90 BASE) MCG/ACT inhaler Inhale 2 puffs into the lungs every 6 (six) hours as needed for wheezing.    [provider]  amoxicillin-clavulanate (AUGMENTIN) 875-125 MG tablet Take 1 tablet by mouth 2 (two) times daily. Patient not taking: Reported on 07/26/2019 08/04/18   Gardiner Barefoot, DPM  AZOPT 1 % ophthalmic suspension INSTILL 1 DROP INTO AFFECTED EYE(S) THREE TIMES DAILY 10/21/17   [provider]  cetirizine (ZYRTEC) 10 MG tablet Take 10 mg by mouth daily.    [provider]  ciprofloxacin (CIPRO) 500 MG tablet Take 1 tablet (500 mg total) by mouth 2 (two) times daily. Patient not taking: Reported on 07/26/2019 08/04/18   Gardiner Barefoot, DPM  cyclobenzaprine (FLEXERIL) 10 MG tablet Take 1 tablet (10 mg total) by mouth 2 (two) times daily as needed for muscle spasms. Patient not taking: Reported on 07/26/2019 02/10/18   McDonald, Maree Erie A, PA-C  diclofenac (VOLTAREN) 75 MG EC tablet TAKE 1 TABLET BY MOUTH TWICE DAILY WITH FOOD AS NEEDED 03/11/18   [provider]  DORZOLAMIDE HCL OP Apply 1 drop to eye 3 (three) times daily.    [provider]  FARXIGA 10 MG TABS tablet Take 10 mg by mouth daily. 09/26/19   [provider]  fluticasone (FLONASE) 50 MCG/ACT nasal spray Place 1 spray into both nostrils daily. Patient not taking: Reported on  07/26/2019 06/12/17   Domenic Moras, PA-C  glimepiride (AMARYL) 4 MG tablet Take 4 mg by mouth 2 (two) times daily. 04/02/18   [provider]  HYDROcodone-acetaminophen (NORCO/VICODIN) 5-325 MG tablet Take 1 tablet by mouth every 6 (six) hours as needed for moderate pain. Patient not taking: Reported on 07/26/2019 12/11/17   Hilts, Legrand Como, MD  INVOKANA 100 MG TABS tablet Take 100 mg by mouth daily. 11/12/17   [provider]  lovastatin (MEVACOR) 20 MG tablet Take 20 mg by mouth every morning.    [provider]  LUMIGAN 0.01 % SOLN INSTILL 1 DROP INTO EACH EYE IN THE EVENING SEPARATE BY AT LEAST 10 MINUTES FROM OTHER INTRAOCULAR PRESSURE REDUCING OPHTHALMIC DRUGS 07/24/18   [provider]  metFORMIN (GLUCOPHAGE) 1000 MG tablet Take 1,000 mg by mouth 2 (two) times daily. 10/26/17  [provider]  methocarbamol (ROBAXIN) 500 MG tablet Take 1 tablet (500 mg total) by mouth every 8 (eight) hours as needed for muscle spasms. Patient not taking: Reported on 07/12/2019 01/23/18   Maudie Flakes, MD  MOBIC 7.5 MG tablet Take 7.5 mg by mouth daily. 01/28/19   [provider]  naproxen (NAPROSYN) 500 MG tablet Take 1 tablet (500 mg total) by mouth 2 (two) times daily. Patient not taking: Reported on 07/12/2019 01/16/18   Ashley Murrain, NP  oxyCODONE-acetaminophen (PERCOCET) 5-325 MG tablet Take 1 tablet by mouth every 6 (six) hours as needed for severe pain. Patient not taking: Reported on 07/12/2019 08/18/18   Edrick Kins, DPM  propranolol (INDERAL) 10 MG tablet SMARTSIG:1 Tablet(s) By Mouth Every Evening 09/01/19   [provider]  tiZANidine (ZANAFLEX) 4 MG tablet Take 4 mg by mouth 2 (two) times daily as needed. 02/24/18   [provider]  travoprost, benzalkonium, (TRAVATAN) 0.004 % ophthalmic solution Place 1 drop into both eyes at bedtime.    [provider]  Vitamin D, Ergocalciferol, (DRISDOL) 1.25 MG (50000 UNIT) CAPS capsule Take  50,000 Units by mouth once a week. 09/14/19   [provider]    Allergies    Cephalexin  Review of Systems   Review of Systems  Constitutional: Negative for chills and fever.  HENT: Positive for sinus pain. Negative for sore throat.   Eyes: Negative for photophobia, pain, redness and visual disturbance.  Respiratory: Negative for cough and shortness of breath.   Cardiovascular: Negative for chest pain.  Gastrointestinal: Negative for abdominal pain, nausea and vomiting.  Genitourinary: Negative for flank pain.  Musculoskeletal: Negative for neck pain and neck stiffness.  Skin: Negative for rash.  Neurological: Positive for headaches. Negative for syncope, speech difficulty, weakness and numbness.  Hematological: Does not bruise/bleed easily.  Psychiatric/Behavioral: Negative for confusion.    Physical Exam Updated Vital Signs BP 128/86 (BP Location: Left Arm)   Pulse 79   Temp 98 F (36.7 C)   Resp 18   SpO2 99%   Physical Exam Vitals and nursing note reviewed.  Constitutional:      Appearance: Normal appearance. He is well-developed.  HENT:     Head: Atraumatic.     Comments: Frontal sinus area pain. No temporal tenderness.     Nose: Nose normal.     Mouth/Throat:     Mouth: Mucous membranes are moist.     Pharynx: Oropharynx is clear.  Eyes:     General: No scleral icterus.    Conjunctiva/sclera: Conjunctivae normal.     Pupils: Pupils are equal, round, and reactive to light.     Comments: Eyes normal. Conj normal, not injected, no tearing.   Neck:     Trachea: No tracheal deviation.     Comments: No stiffness or rigidity. Normal rom. No meningismus. Cardiovascular:     Rate and Rhythm: Normal rate and regular rhythm.     Pulses: Normal pulses.     Heart sounds: Normal heart sounds. No murmur heard. No friction rub. No gallop.   Pulmonary:     Effort: Pulmonary effort is normal. No accessory muscle usage or respiratory distress.     Breath sounds:  Normal breath sounds.  Abdominal:     General: Bowel sounds are normal. There is no distension.     Palpations: Abdomen is soft.     Tenderness: There is no abdominal tenderness. There is no guarding.  Genitourinary:  Comments: No cva tenderness. Musculoskeletal:        General: No swelling.     Cervical back: Normal range of motion and neck supple. No rigidity or tenderness.     Comments: C spine non tender, aligned, normal rom.   Skin:    General: Skin is warm and dry.     Findings: No rash.  Neurological:     Mental Status: He is alert.     Comments: Alert, speech clear, no dysarthria or aphasia. Motor intact bil, stre 5/5. Sens grossly intact. Steady gait.   Psychiatric:        Mood and Affect: Mood normal.     ED Results / Procedures / Treatments   Labs (all labs ordered are listed, but only abnormal results are displayed) Labs Reviewed - No data to display  EKG None  Radiology CT HEAD WO CONTRAST  Result Date: 05/17/2020 CLINICAL DATA:  Headache EXAM: CT HEAD WITHOUT CONTRAST TECHNIQUE: Contiguous axial images were obtained from the base of the skull through the vertex without intravenous contrast. COMPARISON:  03/06/2004 FINDINGS: Brain: No acute intracranial abnormality. Specifically, no hemorrhage, hydrocephalus, mass lesion, acute infarction, or significant intracranial injury. Vascular: No hyperdense vessel or unexpected calcification. Skull: No acute calvarial abnormality. Sinuses/Orbits: No acute findings Other: None IMPRESSION: Normal study. Electronically Signed   By: Rolm Baptise M.D.   On: 05/17/2020 20:48    Procedures Procedures   Medications Ordered in ED Medications - No data to display  ED Course  I have reviewed the triage vital signs and the nursing notes.  Pertinent labs & imaging results that were available during my care of the patient were reviewed by me and considered in my medical decision making (see chart for details).    MDM  Rules/Calculators/A&P                         CT imaging ordered.   Reviewed nursing notes and prior charts for additional history.   CT reviewed/interpreted by me - no hem.   Acetaminophen po. Ibuprofen po.  Po fluids.   Pt comfortable appearing.   Patient currently appears stable for d/c. Discussed ct w pt.   Return precautions provided.      Final Clinical Impression(s) / ED Diagnoses Final diagnoses:  None    Rx / DC Orders ED Discharge Orders    None       Lajean Saver, MD 05/17/20 2212

## 2020-05-17 NOTE — Discharge Instructions (Signed)
It was our pleasure to provide your ER care today - we hope that you feel better.  Your head scan looks good/normal.   Rest. Drink plenty of fluids, stay well hydrated.   Try acetaminophen or excedrin as need for headache.   If sinus congestion/allergy symptoms, try zyrtec or claritin for symptom relief (available over the counter).   Return to ER if worse, new symptoms, fevers, worsening or severe pain, vomiting, or other concern.

## 2020-05-19 ENCOUNTER — Emergency Department (HOSPITAL_COMMUNITY)
Admission: EM | Admit: 2020-05-19 | Discharge: 2020-05-19 | Disposition: A | Discharge status: Home / Self Care | Payer: Medicaid Other | Attending: Emergency Medicine | Admitting: Emergency Medicine

## 2020-05-19 ENCOUNTER — Other Ambulatory Visit: Payer: Self-pay

## 2020-05-19 DIAGNOSIS — Z87891 Personal history of nicotine dependence: Secondary | ICD-10-CM | POA: Diagnosis not present

## 2020-05-19 DIAGNOSIS — E119 Type 2 diabetes mellitus without complications: Secondary | ICD-10-CM | POA: Insufficient documentation

## 2020-05-19 DIAGNOSIS — Z7984 Long term (current) use of oral hypoglycemic drugs: Secondary | ICD-10-CM | POA: Insufficient documentation

## 2020-05-19 DIAGNOSIS — J329 Chronic sinusitis, unspecified: Secondary | ICD-10-CM | POA: Diagnosis not present

## 2020-05-19 DIAGNOSIS — Z20822 Contact with and (suspected) exposure to covid-19: Secondary | ICD-10-CM | POA: Insufficient documentation

## 2020-05-19 DIAGNOSIS — J45909 Unspecified asthma, uncomplicated: Secondary | ICD-10-CM | POA: Diagnosis not present

## 2020-05-19 DIAGNOSIS — R0981 Nasal congestion: Secondary | ICD-10-CM | POA: Diagnosis present

## 2020-05-19 LAB — SARS CORONAVIRUS 2 (TAT 6-24 HRS): SARS Coronavirus 2: NEGATIVE

## 2020-05-19 MED ORDER — SALINE SPRAY 0.65 % NA SOLN: 1.0000 | NASAL | 0 refills | Status: AC | PRN

## 2020-05-19 MED ORDER — AMOXICILLIN-POT CLAVULANATE 875-125 MG PO TABS: 1.0000 | ORAL_TABLET | Freq: Two times a day (BID) | ORAL | 0 refills | Status: DC

## 2020-05-19 MED ORDER — GUAIFENESIN ER 600 MG PO TB12: 600.0000 mg | ORAL_TABLET | Freq: Two times a day (BID) | ORAL | 0 refills | Status: AC

## 2020-05-19 MED ORDER — GUAIFENESIN ER 600 MG PO TB12: 600.0000 mg | ORAL_TABLET | Freq: Two times a day (BID) | ORAL | 0 refills | Status: DC

## 2020-05-19 MED ORDER — NAPROXEN 250 MG PO TABS
500.0000 mg | ORAL_TABLET | Freq: Once | ORAL | Status: AC
  Administered 2020-05-19: 500 mg via ORAL
  Filled 2020-05-19: qty 2

## 2020-05-19 NOTE — ED Provider Notes (Signed)
[facility] EMERGENCY DEPARTMENT Provider Note   CSN: [id number] Arrival date & time: 05/19/20  0757     History No chief complaint on file.   Cristian Welch is a 60 y.o. male.  HPI   60 year old male with a history of allergic rhinitis, arthritis, exercise-induced asthma, GERD, glaucoma, MRSA, prostate cancer s/p prostatectomy, diabetes, who presents to the emergency department today for evaluation of sinus congestion, sinus pressure and sinus headache.  States he has had symptoms for 4 days.  He further reports sneezing.  Denies any sore throat, fevers, chills, cough, chest pain, shortness of breath, neck pain, neck stiffness, vision changes, unilateral numbness/weakness or other associated symptoms.  Seen in the ED several days ago with reassuring work-up.  Has been taking Zyrtec, Claritin, BC sinus meds and using Flonase without resolution of symptoms.  States that headache does improve temporarily after taking over-the-counter pain medicines.  States headache is frontal in nature and is intermittent.  Past Medical History:  Diagnosis Date  . Allergic rhinitis   . Arthritis   . At risk for sleep apnea    STOP-BANG= 4    SENT TO PCP 11-28-2013  . Exercise-induced asthma   . GERD (gastroesophageal reflux disease)   . Glaucoma   . History of MRSA infection    05/ 2014--  POSITIVE MRSA CULTURE SCROTAL ABSCESS  . Prostate cancer [facility])    s/p  prostatectomy 08/ 2014  . Skin tag of anus   . Type 2 diabetes mellitus [facility])     Patient Active Problem List   Diagnosis Date Noted  . Hemorrhoids 05/13/2012  . ONYCHOMYCOSIS, TOENAILS 11/06/2009  . 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 ELEVATED BP READING WITHOUT DX HYPERTENSION 11/06/2009  . ELEVATED PROSTATE SPECIFIC ANTIGEN 09/18/2009  . DIABETES MELLITUS, TYPE II 08/06/2009  . GLYCOSURIA 08/06/2009  . VENTRAL HERNIA 05/07/2009  . RECTAL BLEEDING 05/07/2009  . DYSPNEA ON EXERTION 05/07/2009    Past Surgical  History:  Procedure Laterality Date  . CIRCUMCISION N/A 08/04/2012   Procedure: CIRCUMCISION ADULT;  Surgeon: Molli Hazard, MD;  Location: [facility];  Service: Urology;  Laterality: N/A;  90 MIN ALSO PROSTATE NERVE BLOCK   . EXCISION OF SKIN TAG N/A 12/01/2013   Procedure: EXCISION OF ANAL SKIN TAG;  Surgeon: Leighton Ruff, MD;  Location: [place];  Service: General;  Laterality: N/A;  . FOOT SURGERY Left 1994 (approx)  . INGUINAL HERNIA REPAIR Right 2000 (approx)  . IRRIGATION AND DEBRIDEMENT ABSCESS Right 07/07/2012   Procedure: IRRIGATION AND DEBRIDEMENT ABSCESS;  Surgeon: Molli Hazard, MD;  Location: [facility];  Service: Urology;  Laterality: Right;  and scrotal abcesses  . LYMPHADENECTOMY Bilateral 10/06/2012   Procedure: WITH BILATERAL PELVIC LYMPH NODE DISSECTION ;  Surgeon: Molli Hazard, MD;  Location: [facility];  Service: Urology;  Laterality: Bilateral;  . PROSTATE BIOPSY N/A 08/04/2012   Procedure: BIOPSY TRANSRECTAL ULTRASONIC PROSTATE (TUBP);  Surgeon: Molli Hazard, MD;  Location: [facility];  Service: Urology;  Laterality: N/A;  . ROBOT ASSISTED LAPAROSCOPIC RADICAL PROSTATECTOMY N/A 10/06/2012   Procedure: ROBOTIC ASSISTED LAPAROSCOPIC RADICAL PROSTATECTOMY LEVEL 2;  Surgeon: Molli Hazard, MD;  Location: [facility];  Service: Urology;  Laterality: N/A;  . TRANSTHORACIC ECHOCARDIOGRAM  06-06-2009   NORMAL LVF AND LVSF/ EF 55-60%       Family History  Problem Relation Age of Onset  . Colon cancer Neg Hx   . Esophageal cancer  Neg Hx   . Stomach cancer Neg Hx   . Rectal cancer Neg Hx     Social History   Tobacco Use  . Smoking status: Former Smoker    Packs/day: 0.25    Years: 20.00    Pack years: 5.00    Types: Cigarettes    Quit date: 02/24/2002    Years since quitting: 18.2  . Smokeless tobacco: Never Used  Substance Use Topics  . Alcohol use: No  . Drug use: No     Types: Cocaine, Marijuana, LSD    Comment: last used (approx.  2000)    Home Medications Prior to Admission medications   Medication Sig Start Date End Date Taking? Authorizing Provider  amoxicillin-clavulanate (AUGMENTIN) 875-125 MG tablet Take 1 tablet by mouth every 12 (twelve) hours. 05/19/20  Yes Couture, Cortni S, PA-C  guaiFENesin (MUCINEX) 600 MG 12 hr tablet Take 1 tablet (600 mg total) by mouth 2 (two) times daily. 05/19/20  Yes Couture, Cortni S, PA-C  sodium chloride (OCEAN) 0.65 % SOLN nasal spray Place 1 spray into both nostrils as needed for congestion. 05/19/20  Yes Couture, Cortni S, PA-C  ACCU-CHEK GUIDE test strip  11/07/19   [provider]  acetaminophen (TYLENOL) 500 MG tablet Take 2 tablets (1,000 mg total) by mouth every 6 (six) hours as needed. 02/10/18   McDonald, Mia A, PA-C  acyclovir (ZOVIRAX) 400 MG tablet Take 1 tablet (400 mg total) by mouth 4 (four) times daily. Patient not taking: Reported on 07/26/2019 02/06/15   Deno Etienne, DO  albuterol (PROVENTIL HFA;VENTOLIN HFA) 108 (90 BASE) MCG/ACT inhaler Inhale 2 puffs into the lungs every 6 (six) hours as needed for wheezing.    [provider]  AZOPT 1 % ophthalmic suspension INSTILL 1 DROP INTO AFFECTED EYE(S) THREE TIMES DAILY 10/21/17   [provider]  cetirizine (ZYRTEC) 10 MG tablet Take 10 mg by mouth daily.    [provider]  ciprofloxacin (CIPRO) 500 MG tablet Take 1 tablet (500 mg total) by mouth 2 (two) times daily. Patient not taking: Reported on 07/26/2019 08/04/18   Gardiner Barefoot, DPM  cyclobenzaprine (FLEXERIL) 10 MG tablet Take 1 tablet (10 mg total) by mouth 2 (two) times daily as needed for muscle spasms. Patient not taking: Reported on 07/26/2019 02/10/18   McDonald, Maree Erie A, PA-C  diclofenac (VOLTAREN) 75 MG EC tablet TAKE 1 TABLET BY MOUTH TWICE DAILY WITH FOOD AS NEEDED 03/11/18   [provider]  DORZOLAMIDE HCL OP Apply 1 drop to eye 3 (three) times daily.     [provider]  FARXIGA 10 MG TABS tablet Take 10 mg by mouth daily. 09/26/19   [provider]  fluticasone (FLONASE) 50 MCG/ACT nasal spray Place 1 spray into both nostrils daily. Patient not taking: Reported on 07/26/2019 06/12/17   Domenic Moras, PA-C  glimepiride (AMARYL) 4 MG tablet Take 4 mg by mouth 2 (two) times daily. 04/02/18   [provider]  HYDROcodone-acetaminophen (NORCO/VICODIN) 5-325 MG tablet Take 1 tablet by mouth every 6 (six) hours as needed for moderate pain. Patient not taking: Reported on 07/26/2019 12/11/17   Hilts, Legrand Como, MD  INVOKANA 100 MG TABS tablet Take 100 mg by mouth daily. 11/12/17   [provider]  lovastatin (MEVACOR) 20 MG tablet Take 20 mg by mouth every morning.    [provider]  LUMIGAN 0.01 % SOLN INSTILL 1 DROP INTO EACH EYE IN THE EVENING SEPARATE BY AT LEAST 10 MINUTES  FROM OTHER INTRAOCULAR PRESSURE REDUCING OPHTHALMIC DRUGS 07/24/18   [provider]  metFORMIN (GLUCOPHAGE) 1000 MG tablet Take 1,000 mg by mouth 2 (two) times daily. 10/26/17   [provider]  methocarbamol (ROBAXIN) 500 MG tablet Take 1 tablet (500 mg total) by mouth every 8 (eight) hours as needed for muscle spasms. Patient not taking: Reported on 07/12/2019 01/23/18   Maudie Flakes, MD  MOBIC 7.5 MG tablet Take 7.5 mg by mouth daily. 01/28/19   [provider]  naproxen (NAPROSYN) 500 MG tablet Take 1 tablet (500 mg total) by mouth 2 (two) times daily. Patient not taking: Reported on 07/12/2019 01/16/18   Ashley Murrain, NP  oxyCODONE-acetaminophen (PERCOCET) 5-325 MG tablet Take 1 tablet by mouth every 6 (six) hours as needed for severe pain. Patient not taking: Reported on 07/12/2019 08/18/18   Edrick Kins, DPM  propranolol (INDERAL) 10 MG tablet SMARTSIG:1 Tablet(s) By Mouth Every Evening 09/01/19   [provider]  tiZANidine (ZANAFLEX) 4 MG tablet Take 4 mg by mouth 2 (two) times daily as needed. 02/24/18    [provider]  travoprost, benzalkonium, (TRAVATAN) 0.004 % ophthalmic solution Place 1 drop into both eyes at bedtime.    [provider]  Vitamin D, Ergocalciferol, (DRISDOL) 1.25 MG (50000 UNIT) CAPS capsule Take 50,000 Units by mouth once a week. 09/14/19   [provider]    Allergies    Cephalexin  Review of Systems   Review of Systems  Constitutional: Negative for fever.  HENT: Positive for congestion, rhinorrhea, sinus pressure, sinus pain and sneezing. Negative for ear pain and sore throat.   Respiratory: Negative for shortness of breath.   Cardiovascular: Negative for chest pain.  Gastrointestinal: Negative for abdominal pain, diarrhea, nausea and vomiting.  Musculoskeletal: Negative for neck pain and neck stiffness.  Neurological: Positive for headaches. Negative for dizziness, weakness, light-headedness and numbness.    Physical Exam Updated Vital Signs BP (!) 133/93 (BP Location: Right Arm)   Pulse 73   Temp 98.2 F (36.8 C) (Oral)   Resp 16   Ht 6' (1.829 m)   Wt 104.3 kg   SpO2 94%   BMI 31.19 kg/m   Physical Exam Constitutional:      General: He is not in acute distress.    Appearance: He is well-developed.  HENT:     Head:     Comments: Mild ttp to the frontal and maxillary sinuses    Nose:     Comments: Nasal turbinates swollen bilaterally    Mouth/Throat:     Mouth: Mucous membranes are dry.     Pharynx: Oropharynx is clear.  Eyes:     Extraocular Movements: Extraocular movements intact.     Conjunctiva/sclera: Conjunctivae normal.     Pupils: Pupils are equal, round, and reactive to light.  Cardiovascular:     Rate and Rhythm: Normal rate and regular rhythm.  Pulmonary:     Effort: Pulmonary effort is normal.     Breath sounds: Normal breath sounds.  Abdominal:     Palpations: Abdomen is soft.     Tenderness: There is no abdominal tenderness.  Skin:    General: Skin is warm and dry.  Neurological:     Mental  Status: He is alert and oriented to person, place, and time.     Comments: Mental Status:  Alert, thought content appropriate, able to give a coherent history. Speech fluent without evidence of aphasia. Able to follow 2 step  commands without difficulty.  Cranial Nerves:  II:  pupils equal, round, reactive to light III,IV, VI: ptosis not present, extra-ocular motions intact bilaterally  V,VII: smile symmetric, facial light touch sensation equal VIII: hearing grossly normal to voice  X: uvula elevates symmetrically  XI: bilateral shoulder shrug symmetric and strong XII: midline tongue extension without fassiculations Motor:  Normal tone. 5/5 strength of BUE and BLE major muscle groups including strong and equal grip strength and dorsiflexion/plantar flexion Sensory: light touch normal in all extremities.      ED Results / Procedures / Treatments   Labs (all labs ordered are listed, but only abnormal results are displayed) Labs Reviewed  SARS CORONAVIRUS 2 (TAT 6-24 HRS)    EKG None  Radiology CT HEAD WO CONTRAST  Result Date: 05/17/2020 CLINICAL DATA:  Headache EXAM: CT HEAD WITHOUT CONTRAST TECHNIQUE: Contiguous axial images were obtained from the base of the skull through the vertex without intravenous contrast. COMPARISON:  03/06/2004 FINDINGS: Brain: No acute intracranial abnormality. Specifically, no hemorrhage, hydrocephalus, mass lesion, acute infarction, or significant intracranial injury. Vascular: No hyperdense vessel or unexpected calcification. Skull: No acute calvarial abnormality. Sinuses/Orbits: No acute findings Other: None IMPRESSION: Normal study. Electronically Signed   By: Rolm Baptise M.D.   On: 05/17/2020 20:48    Procedures Procedures   Medications Ordered in ED Medications - No data to display  ED Course  I have reviewed the triage vital signs and the nursing notes.  Pertinent labs & imaging results that were available during my care of the patient  were reviewed by me and considered in my medical decision making (see chart for details).    MDM Rules/Calculators/A&P                          60 year old male here for URI symptoms and sinus headache.  Seen 2 days ago in the ED for same.  At that time had CT scan which was reassuring.  He has been taking over-the-counter medications without relief.  Neurologic exam is normal today and doubt acute intracranial cause of symptoms.  Suspect viral sinusitis.  Discussed supportive care.  He was given a prescription for antibiotics and advised not to fill this prescription and last symptoms persist past 7 to 10 days or he develops a fever.  He voices understanding of this plan and agrees not to fill prescription unless absolutely necessary.  I also sent a Covid test and patient will follow up on the result on MyChart.  Advised on close follow-up with PCP and strict return precautions.  He voices understanding of the plan and reasons to return.  All questions answered.  Patient stable for discharge.   Final Clinical Impression(s) / ED Diagnoses Final diagnoses:  Sinusitis, unspecified chronicity, unspecified location    Rx / DC Orders ED Discharge Orders         Ordered    amoxicillin-clavulanate (AUGMENTIN) 875-125 MG tablet  Every 12 hours        05/19/20 0827    sodium chloride (OCEAN) 0.65 % SOLN nasal spray  As needed        05/19/20 0827    guaiFENesin (MUCINEX) 600 MG 12 hr tablet  2 times daily        05/19/20 0827           Rodney Booze, PA-C 05/19/20 3810    Isla Pence, MD 05/19/20 [phone]

## 2020-05-19 NOTE — Discharge Instructions (Addendum)
Use the mucinex and saline nasal spray. Continue taking the flonase nasal spray that you have at home. Rotate tylenol and motrin for headaches.   You were given a prescription to treat a bacterial sinus infection. At this time I have lower suspicion that your symptoms are from a bacteria, and suspect that you likely have a virus. I would recommend holding on filling the antibiotic prescription for 3 days and only take it if the symptoms persist or you develop a fever.   Please follow up with your primary care provider within 5-7 days for re-evaluation of your symptoms. If you do not have a primary care provider, information for a healthcare clinic has been provided for you to make arrangements for follow up care. Please return to the emergency department for any new or worsening symptoms.

## 2020-05-19 NOTE — ED Triage Notes (Signed)
Pt here for eval of continued headache, presumably from pollen, as he is sneezing also. Taking allergy medication and OTC pain medication without relief. Seen Thursday for same.

## 2020-06-25 ENCOUNTER — Encounter: Payer: Self-pay | Admitting: Podiatry

## 2020-06-25 ENCOUNTER — Other Ambulatory Visit: Payer: Self-pay

## 2020-06-25 ENCOUNTER — Ambulatory Visit (INDEPENDENT_AMBULATORY_CARE_PROVIDER_SITE_OTHER): Payer: Medicaid Other | Admitting: Podiatry

## 2020-06-25 DIAGNOSIS — M79674 Pain in right toe(s): Secondary | ICD-10-CM

## 2020-06-25 DIAGNOSIS — L84 Corns and callosities: Secondary | ICD-10-CM

## 2020-06-25 DIAGNOSIS — B351 Tinea unguium: Secondary | ICD-10-CM | POA: Diagnosis not present

## 2020-06-25 DIAGNOSIS — M79675 Pain in left toe(s): Secondary | ICD-10-CM

## 2020-06-25 DIAGNOSIS — E119 Type 2 diabetes mellitus without complications: Secondary | ICD-10-CM

## 2020-06-28 NOTE — Progress Notes (Signed)
  Subjective:  Patient ID: Cristian Welch, male    DOB: 1960-11-02,  MRN: [id number]  60 y.o. male presents with preventative diabetic foot care and painful thick toenails that are difficult to trim. Pain interferes with ambulation. Aggravating factors include wearing enclosed shoe gear. Pain is relieved with periodic professional debridement.   He does have calluses b/l feet, but states he does not want those pared on today's visit.  Patient's blood sugar was 148 mg/dl yesterday morning.  Patient did not check blood glucose this morning.  PCP: Nolene Ebbs, MD and last visit was: two weeks.  Review of Systems: Negative except as noted in the HPI.   Allergies  Allergen Reactions  . Cephalexin Rash    Objective:  There were no vitals filed for this visit. Constitutional Patient is a pleasant 60 y.o. African American male in NAD. AAO x 3.  Vascular Neurovascular status unchanged b/l lower extremities. Capillary refill time to digits immediate b/l. Palpable pedal pulses b/l LE. Pedal hair absent. Lower extremity skin temperature gradient within normal limits. No cyanosis or clubbing noted.  Neurologic Normal speech. Protective sensation intact 5/5 intact bilaterally with 10g monofilament b/l. Vibratory sensation intact b/l.  Dermatologic Pedal skin with normal turgor, texture and tone bilaterally. No open wounds bilaterally. No interdigital macerations bilaterally. Toenails 1-5 b/l elongated, discolored, dystrophic, thickened, crumbly with subungual debris and tenderness to dorsal palpation. Well healed surgical scars along dorsomedial aspect right 1st metatarsal and dorsal aspect right 2nd digit. Hyperkeratotic lesion(s) L hallux, R hallux, submet head 3 left foot, submet head 3 right foot and submet head 5 right foot.  No erythema, no edema, no drainage, no fluctuance.  Orthopedic: Normal muscle strength 5/5 to all lower extremity muscle groups bilaterally. No pain crepitus or joint  limitation noted with ROM b/l. Hallux valgus with bunion deformity noted left foot.    Assessment:   1. Pain due to onychomycosis of toenails of both feet   2. Callus   3. Controlled type 2 diabetes mellitus without complication, without long-term current use of insulin ([place])    Plan:  Patient was evaluated and treated and all questions answered.  Onychomycosis with pain -Nails palliatively debridement as below. -Educated on self-care  Procedure: Nail Debridement Rationale: Pain Type of Debridement: manual, sharp debridement. Instrumentation: Nail nipper, rotary burr. Number of Nails: 10  -Examined patient. Calluses not pared per patient request. -Discussed he would need to sign new Medicaid ABN refusing callus paring on today. Patient signed new Medicaid ABN for this year. Patient refuses services of callus paring today. Copy given to patient on today's visit and copy placed in patient chart.    -Patient states he will continue using [employer] and [employer] on his calluses. He will call if he has any problems. -Patient to continue soft, supportive shoe gear daily. -Toenails 1-5 b/l were debrided in length and girth with sterile nail nippers and dremel without iatrogenic bleeding.  -Patient to report any pedal injuries to medical professional immediately. -Patient/POA to call should there be question/concern in the interim.  Return in about 3 months (around 09/25/2020).  Marzetta Board, DPM

## 2020-09-10 ENCOUNTER — Other Ambulatory Visit: Payer: Self-pay

## 2020-09-10 ENCOUNTER — Emergency Department (HOSPITAL_COMMUNITY)
Admission: EM | Admit: 2020-09-10 | Discharge: 2020-09-10 | Disposition: A | Discharge status: Home / Self Care | Payer: No Typology Code available for payment source | Attending: Emergency Medicine | Admitting: Emergency Medicine

## 2020-09-10 ENCOUNTER — Emergency Department (HOSPITAL_COMMUNITY): Payer: No Typology Code available for payment source

## 2020-09-10 DIAGNOSIS — Z7984 Long term (current) use of oral hypoglycemic drugs: Secondary | ICD-10-CM | POA: Diagnosis not present

## 2020-09-10 DIAGNOSIS — Z87891 Personal history of nicotine dependence: Secondary | ICD-10-CM | POA: Insufficient documentation

## 2020-09-10 DIAGNOSIS — M25512 Pain in left shoulder: Secondary | ICD-10-CM | POA: Diagnosis not present

## 2020-09-10 DIAGNOSIS — S20222A Contusion of left back wall of thorax, initial encounter: Secondary | ICD-10-CM | POA: Insufficient documentation

## 2020-09-10 DIAGNOSIS — Z79899 Other long term (current) drug therapy: Secondary | ICD-10-CM | POA: Diagnosis not present

## 2020-09-10 DIAGNOSIS — I10 Essential (primary) hypertension: Secondary | ICD-10-CM | POA: Diagnosis not present

## 2020-09-10 DIAGNOSIS — Y92410 Unspecified street and highway as the place of occurrence of the external cause: Secondary | ICD-10-CM | POA: Insufficient documentation

## 2020-09-10 DIAGNOSIS — S299XXA Unspecified injury of thorax, initial encounter: Secondary | ICD-10-CM | POA: Diagnosis present

## 2020-09-10 DIAGNOSIS — S2020XA Contusion of thorax, unspecified, initial encounter: Secondary | ICD-10-CM

## 2020-09-10 DIAGNOSIS — E119 Type 2 diabetes mellitus without complications: Secondary | ICD-10-CM | POA: Diagnosis not present
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
## 2020-09-10 NOTE — ED Notes (Signed)
Pt back from X-ray.  
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
## 2020-09-25 ENCOUNTER — Ambulatory Visit: Payer: Medicaid Other | Admitting: Podiatry

## 2020-10-31 ENCOUNTER — Other Ambulatory Visit: Payer: Self-pay

## 2020-10-31 ENCOUNTER — Encounter: Payer: Self-pay | Admitting: Podiatry

## 2020-10-31 ENCOUNTER — Ambulatory Visit (INDEPENDENT_AMBULATORY_CARE_PROVIDER_SITE_OTHER): Payer: Medicaid Other | Admitting: Podiatry

## 2020-10-31 DIAGNOSIS — E119 Type 2 diabetes mellitus without complications: Secondary | ICD-10-CM | POA: Diagnosis not present

## 2020-10-31 DIAGNOSIS — M2012 Hallux valgus (acquired), left foot: Secondary | ICD-10-CM | POA: Diagnosis not present

## 2020-10-31 DIAGNOSIS — M79675 Pain in left toe(s): Secondary | ICD-10-CM

## 2020-10-31 DIAGNOSIS — B351 Tinea unguium: Secondary | ICD-10-CM | POA: Diagnosis not present

## 2020-10-31 DIAGNOSIS — M79674 Pain in right toe(s): Secondary | ICD-10-CM

## 2020-10-31 NOTE — Progress Notes (Signed)
This patient returns to my office for at risk foot care.  This patient requires this care by a professional since this patient will be at risk due to having type 2 diabetes.  This patient is unable to cut nails himself since the patient cannot reach his nails.These nails are painful walking and wearing shoes.  This patient presents for at risk foot care today.  General Appearance  Alert, conversant and in no acute stress.  Vascular  Dorsalis pedis and posterior tibial  pulses are palpable  bilaterally.  Capillary return is within normal limits  bilaterally. Temperature is within normal limits  bilaterally.  Neurologic  Senn-Weinstein monofilament wire test within normal limits  bilaterally. Muscle power within normal limits bilaterally.  Nails Thick disfigured discolored nails with subungual debris  from hallux to fifth toes bilaterally. No evidence of bacterial infection or drainage bilaterally.  Orthopedic  No limitations of motion  feet .  No crepitus or effusions noted.  No bony pathology or digital deformities noted.  Skin  normotropic skin with no porokeratosis noted bilaterally.  No signs of infections or ulcers noted.     Onychomycosis  Pain in right toes  Pain in left toes  Consent was obtained for treatment procedures.   Mechanical debridement of nails 1-5  bilaterally performed with a nail nipper.  Filed with dremel without incident.    Return office visit   4 months                   Told patient to return for periodic foot care and evaluation due to potential at risk complications.   Dayle Sherpa DPM   

## 2021-02-01 ENCOUNTER — Encounter: Payer: Self-pay | Admitting: Podiatry

## 2021-02-01 ENCOUNTER — Other Ambulatory Visit: Payer: Self-pay

## 2021-02-01 ENCOUNTER — Ambulatory Visit: Payer: Medicaid Other | Admitting: Podiatry

## 2021-02-01 DIAGNOSIS — M79674 Pain in right toe(s): Secondary | ICD-10-CM | POA: Diagnosis not present

## 2021-02-01 DIAGNOSIS — M79675 Pain in left toe(s): Secondary | ICD-10-CM | POA: Diagnosis not present

## 2021-02-01 DIAGNOSIS — B351 Tinea unguium: Secondary | ICD-10-CM | POA: Diagnosis not present

## 2021-02-01 DIAGNOSIS — E119 Type 2 diabetes mellitus without complications: Secondary | ICD-10-CM

## 2021-02-01 NOTE — Progress Notes (Signed)
This patient returns to my office for at risk foot care.  This patient requires this care by a professional since this patient will be at risk due to having type 2 diabetes.  This patient is unable to cut nails himself since the patient cannot reach his nails.These nails are painful walking and wearing shoes.  This patient presents for at risk foot care today.  General Appearance  Alert, conversant and in no acute stress.  Vascular  Dorsalis pedis and posterior tibial  pulses are palpable  bilaterally.  Capillary return is within normal limits  bilaterally. Temperature is within normal limits  bilaterally.  Neurologic  Senn-Weinstein monofilament wire test within normal limits  bilaterally. Muscle power within normal limits bilaterally.  Nails Thick disfigured discolored nails with subungual debris  from hallux to fifth toes bilaterally. No evidence of bacterial infection or drainage bilaterally.  Orthopedic  No limitations of motion  feet .  No crepitus or effusions noted.  No bony pathology or digital deformities noted.  Skin  normotropic skin with no porokeratosis noted bilaterally.  No signs of infections or ulcers noted.     Onychomycosis  Pain in right toes  Pain in left toes  Consent was obtained for treatment procedures.   Mechanical debridement of nails 1-5  bilaterally performed with a nail nipper.  Filed with dremel without incident.    Return office visit   4 months                   Told patient to return for periodic foot care and evaluation due to potential at risk complications.   Breon Rehm DPM   

## 2021-05-03 ENCOUNTER — Other Ambulatory Visit: Payer: Self-pay

## 2021-05-03 ENCOUNTER — Encounter: Payer: Self-pay | Admitting: Podiatry

## 2021-05-03 ENCOUNTER — Ambulatory Visit: Payer: Medicaid Other | Admitting: Podiatry

## 2021-05-03 DIAGNOSIS — B351 Tinea unguium: Secondary | ICD-10-CM

## 2021-05-03 DIAGNOSIS — M2012 Hallux valgus (acquired), left foot: Secondary | ICD-10-CM

## 2021-05-03 DIAGNOSIS — M79675 Pain in left toe(s): Secondary | ICD-10-CM

## 2021-05-03 DIAGNOSIS — E119 Type 2 diabetes mellitus without complications: Secondary | ICD-10-CM

## 2021-05-03 DIAGNOSIS — M79674 Pain in right toe(s): Secondary | ICD-10-CM

## 2021-05-03 NOTE — Progress Notes (Signed)
This patient returns to my office for at risk foot care.  This patient requires this care by a professional since this patient will be at risk due to having type 2 diabetes.  This patient is unable to cut nails himself since the patient cannot reach his nails.These nails are painful walking and wearing shoes.  This patient presents for at risk foot care today.  General Appearance  Alert, conversant and in no acute stress.  Vascular  Dorsalis pedis and posterior tibial  pulses are palpable  bilaterally.  Capillary return is within normal limits  bilaterally. Temperature is within normal limits  bilaterally.  Neurologic  Senn-Weinstein monofilament wire test within normal limits  bilaterally. Muscle power within normal limits bilaterally.  Nails Thick disfigured discolored nails with subungual debris  from hallux to fifth toes bilaterally. No evidence of bacterial infection or drainage bilaterally.  Orthopedic  No limitations of motion  feet .  No crepitus or effusions noted.  No bony pathology or digital deformities noted.  Skin  normotropic skin with no porokeratosis noted bilaterally.  No signs of infections or ulcers noted.     Onychomycosis  Pain in right toes  Pain in left toes  Consent was obtained for treatment procedures.   Mechanical debridement of nails 1-5  bilaterally performed with a nail nipper.  Filed with dremel without incident.    Return office visit   4 months                   Told patient to return for periodic foot care and evaluation due to potential at risk complications.   Yissel Habermehl DPM   

## 2021-08-06 ENCOUNTER — Ambulatory Visit (INDEPENDENT_AMBULATORY_CARE_PROVIDER_SITE_OTHER): Payer: Medicaid Other | Admitting: Podiatry

## 2021-08-06 ENCOUNTER — Encounter: Payer: Self-pay | Admitting: Podiatry

## 2021-08-06 DIAGNOSIS — M79675 Pain in left toe(s): Secondary | ICD-10-CM

## 2021-08-06 DIAGNOSIS — E119 Type 2 diabetes mellitus without complications: Secondary | ICD-10-CM | POA: Diagnosis not present

## 2021-08-06 DIAGNOSIS — B351 Tinea unguium: Secondary | ICD-10-CM

## 2021-08-06 DIAGNOSIS — M79674 Pain in right toe(s): Secondary | ICD-10-CM | POA: Diagnosis not present

## 2021-08-06 NOTE — Progress Notes (Signed)
This patient returns to my office for at risk foot care.  This patient requires this care by a professional since this patient will be at risk due to having type 2 diabetes.  This patient is unable to cut nails himself since the patient cannot reach his nails.These nails are painful walking and wearing shoes.  This patient presents for at risk foot care today.  General Appearance  Alert, conversant and in no acute stress.  Vascular  Dorsalis pedis and posterior tibial  pulses are palpable  bilaterally.  Capillary return is within normal limits  bilaterally. Temperature is within normal limits  bilaterally.  Neurologic  Senn-Weinstein monofilament wire test within normal limits  bilaterally. Muscle power within normal limits bilaterally.  Nails Thick disfigured discolored nails with subungual debris  from hallux to fifth toes bilaterally. No evidence of bacterial infection or drainage bilaterally.  Orthopedic  No limitations of motion  feet .  No crepitus or effusions noted.  No bony pathology or digital deformities noted.  Skin  normotropic skin with no porokeratosis noted bilaterally.  No signs of infections or ulcers noted.     Onychomycosis  Pain in right toes  Pain in left toes  Consent was obtained for treatment procedures.   Mechanical debridement of nails 1-5  bilaterally performed with a nail nipper.  Filed with dremel without incident.    Return office visit    3 months                  Told patient to return for periodic foot care and evaluation due to potential at risk complications.   Zaven Klemens DPM   

## 2021-09-06 ENCOUNTER — Ambulatory Visit: Payer: Medicaid Other | Admitting: Podiatry

## 2021-11-06 ENCOUNTER — Encounter: Payer: Self-pay | Admitting: Podiatry

## 2021-11-06 ENCOUNTER — Ambulatory Visit (INDEPENDENT_AMBULATORY_CARE_PROVIDER_SITE_OTHER): Payer: Medicaid Other | Admitting: Podiatry

## 2021-11-06 DIAGNOSIS — E119 Type 2 diabetes mellitus without complications: Secondary | ICD-10-CM

## 2021-11-06 DIAGNOSIS — M79674 Pain in right toe(s): Secondary | ICD-10-CM

## 2021-11-06 DIAGNOSIS — M79675 Pain in left toe(s): Secondary | ICD-10-CM

## 2021-11-06 DIAGNOSIS — B351 Tinea unguium: Secondary | ICD-10-CM

## 2021-11-06 DIAGNOSIS — M2012 Hallux valgus (acquired), left foot: Secondary | ICD-10-CM

## 2021-11-06 NOTE — Progress Notes (Signed)
This patient returns to my office for at risk foot care.  This patient requires this care by a professional since this patient will be at risk due to having type 2 diabetes.  This patient is unable to cut nails himself since the patient cannot reach his nails.These nails are painful walking and wearing shoes.  This patient presents for at risk foot care today.  General Appearance  Alert, conversant and in no acute stress.  Vascular  Dorsalis pedis and posterior tibial  pulses are palpable  bilaterally.  Capillary return is within normal limits  bilaterally. Temperature is within normal limits  bilaterally.  Neurologic  Senn-Weinstein monofilament wire test within normal limits  bilaterally. Muscle power within normal limits bilaterally.  Nails Thick disfigured discolored nails with subungual debris  from hallux to fifth toes bilaterally. No evidence of bacterial infection or drainage bilaterally.  Orthopedic  No limitations of motion  feet .  No crepitus or effusions noted.  No bony pathology or digital deformities noted.  Skin  normotropic skin with no porokeratosis noted bilaterally.  No signs of infections or ulcers noted.     Onychomycosis  Pain in right toes  Pain in left toes  Consent was obtained for treatment procedures.   Mechanical debridement of nails 1-5  bilaterally performed with a nail nipper.  Filed with dremel without incident.    Return office visit    3 months                  Told patient to return for periodic foot care and evaluation due to potential at risk complications.   Wesleigh Markovic DPM   

## 2022-02-05 ENCOUNTER — Ambulatory Visit (INDEPENDENT_AMBULATORY_CARE_PROVIDER_SITE_OTHER): Payer: Medicaid Other | Admitting: Podiatry

## 2022-02-05 ENCOUNTER — Encounter: Payer: Self-pay | Admitting: Podiatry

## 2022-02-05 VITALS — BP 133/91 | HR 84

## 2022-02-05 DIAGNOSIS — B351 Tinea unguium: Secondary | ICD-10-CM | POA: Diagnosis not present

## 2022-02-05 DIAGNOSIS — E119 Type 2 diabetes mellitus without complications: Secondary | ICD-10-CM

## 2022-02-05 DIAGNOSIS — M79674 Pain in right toe(s): Secondary | ICD-10-CM | POA: Diagnosis not present

## 2022-02-05 DIAGNOSIS — L84 Corns and callosities: Secondary | ICD-10-CM | POA: Diagnosis not present

## 2022-02-05 DIAGNOSIS — M79675 Pain in left toe(s): Secondary | ICD-10-CM | POA: Diagnosis not present

## 2022-02-05 DIAGNOSIS — M2012 Hallux valgus (acquired), left foot: Secondary | ICD-10-CM

## 2022-02-05 NOTE — Progress Notes (Signed)
This patient returns to my office for at risk foot care.  This patient requires this care by a professional since this patient will be at risk due to having type 2 diabetes.  This patient is unable to cut nails himself since the patient cannot reach his nails.These nails are painful walking and wearing shoes.  This patient presents for at risk foot care today.  General Appearance  Alert, conversant and in no acute stress.  Vascular  Dorsalis pedis and posterior tibial  pulses are palpable  bilaterally.  Capillary return is within normal limits  bilaterally. Temperature is within normal limits  bilaterally.  Neurologic  Senn-Weinstein monofilament wire test within normal limits  bilaterally. Muscle power within normal limits bilaterally.  Nails Thick disfigured discolored nails with subungual debris  from hallux to fifth toes bilaterally. No evidence of bacterial infection or drainage bilaterally.  Orthopedic  No limitations of motion  feet .  No crepitus or effusions noted.  No bony pathology or digital deformities noted.  Skin  normotropic skin with no porokeratosis noted bilaterally.  No signs of infections or ulcers noted.   Posterior heel callus.  Onychomycosis  Pain in right toes  Pain in left toes  Heel Callus  Consent was obtained for treatment procedures.   Mechanical debridement of nails 1-5  bilaterally performed with a nail nipper.  Filed with dremel without incident. Debride heel callus with dremel tool.   Return office visit     3   months                Told patient to return for periodic foot care and evaluation due to potential at risk complications.   Gardiner Barefoot DPM

## 2022-02-28 IMAGING — CT CT HEAD W/O CM
3 series · 16 of 47 positions shown, 19 images · non-contrast
Comparison: 03/06/2004

CLINICAL DATA: Headache

EXAM:
CT HEAD WITHOUT CONTRAST
TECHNIQUE: Contiguous axial images were obtained from the base of the skull
through the vertex without intravenous contrast.

[Series 3: head 5.0 h30s · axial · 0.44mm/px · z∈[-103,+32]mm · 10 of 33 slices shown, 13 images]
[im 3/33  brain]
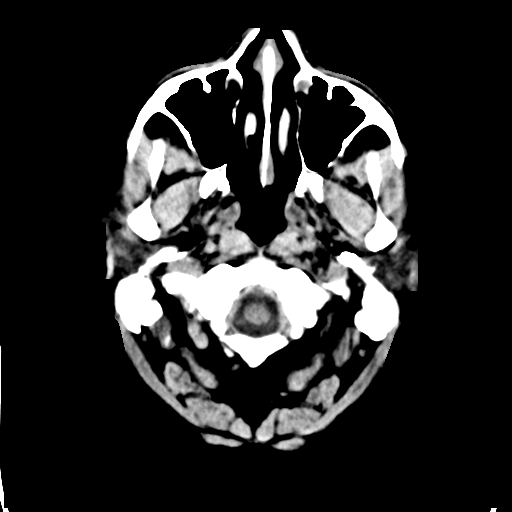
[im 3/33  bone]
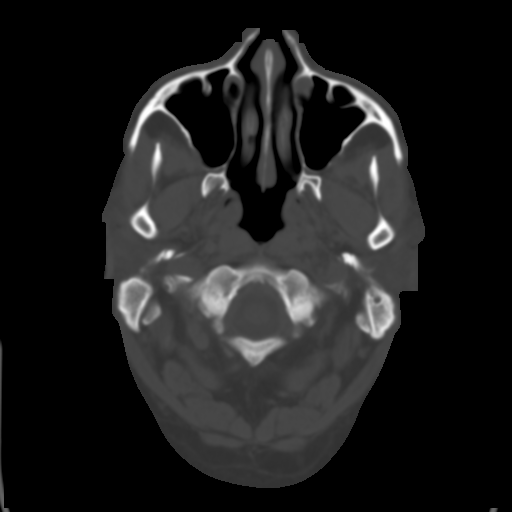
[im 6/33  brain]
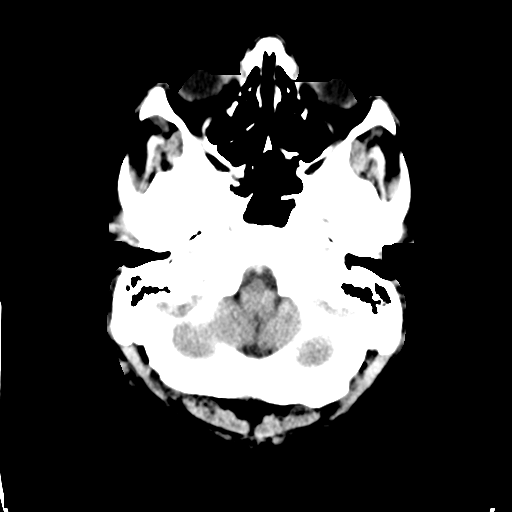
[im 9/33  brain]
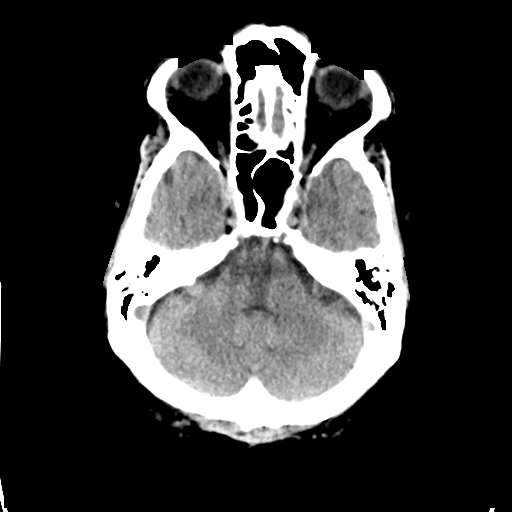
[im 12/33  brain]
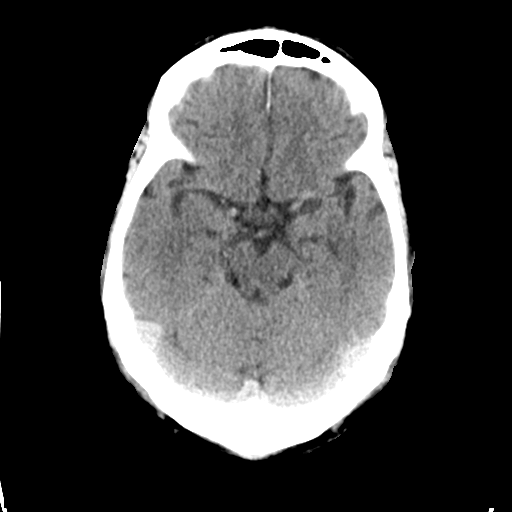
[im 15/33  brain]
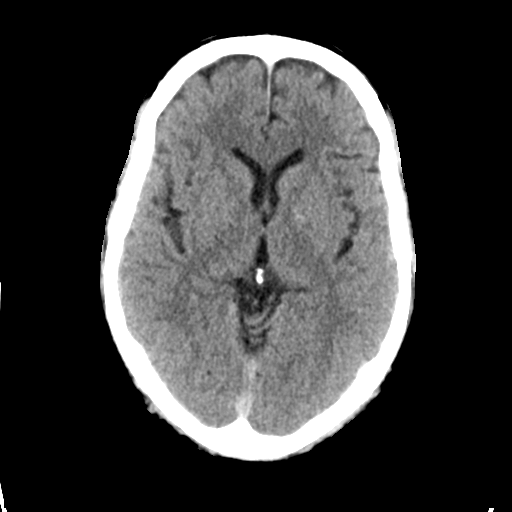
[im 15/33  bone]
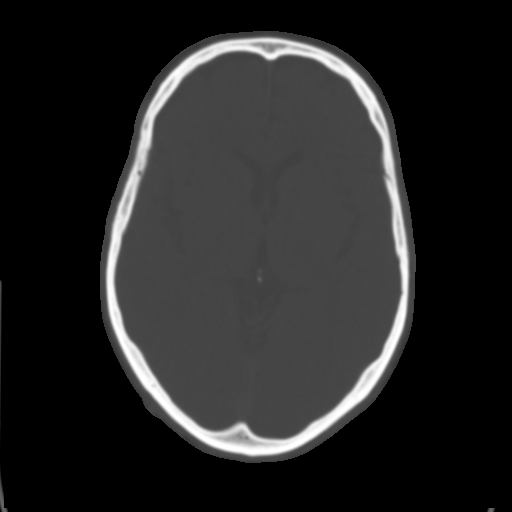
[im 18/33  brain]
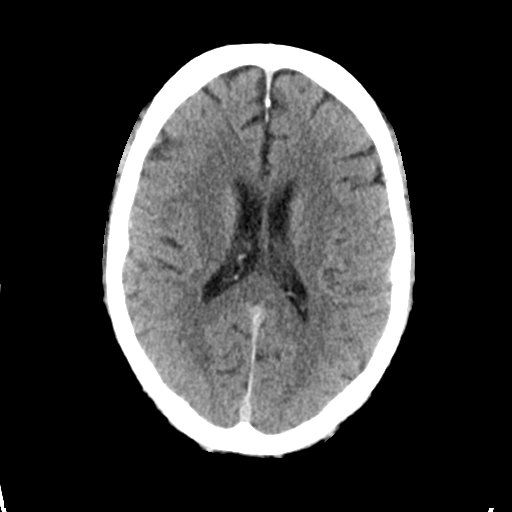
[im 21/33  brain]
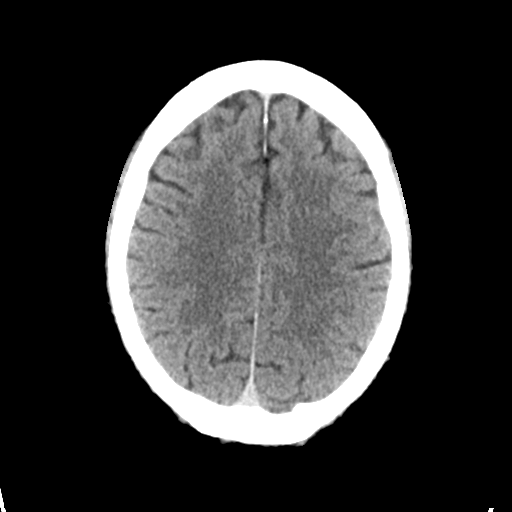
[im 25/33  brain]
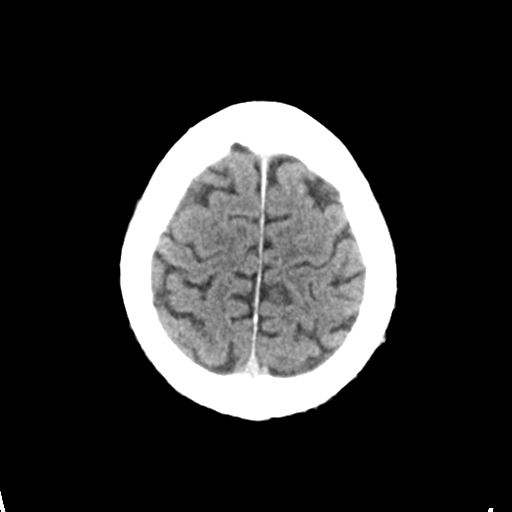
[im 27/33  brain]
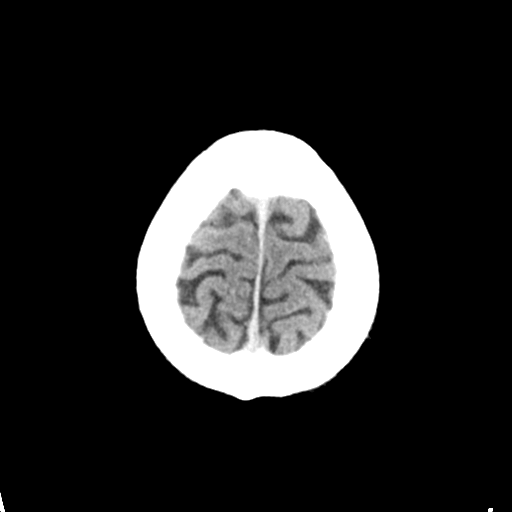
[im 27/33  bone]
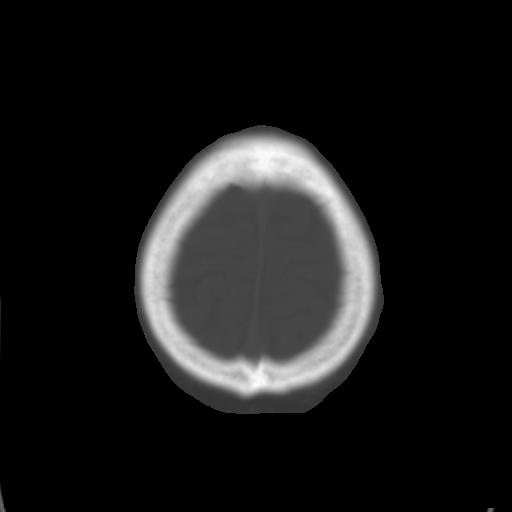
[im 30/33  brain]
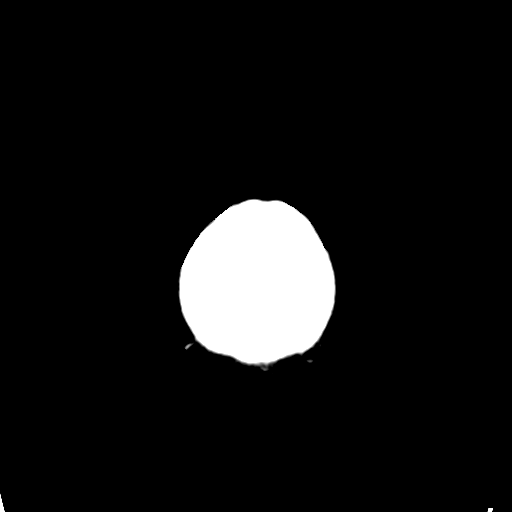

[Series 5: head 3.0 mpr cor · coronal · 0.32mm/px · 3 of 74 slices shown]
[im 25/74  brain]
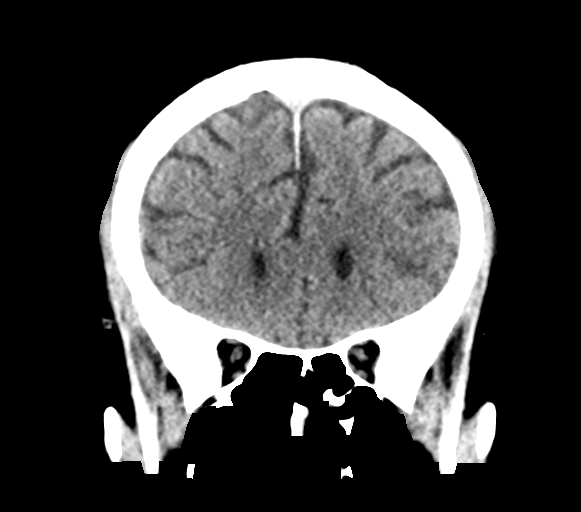
[im 33/74  brain]
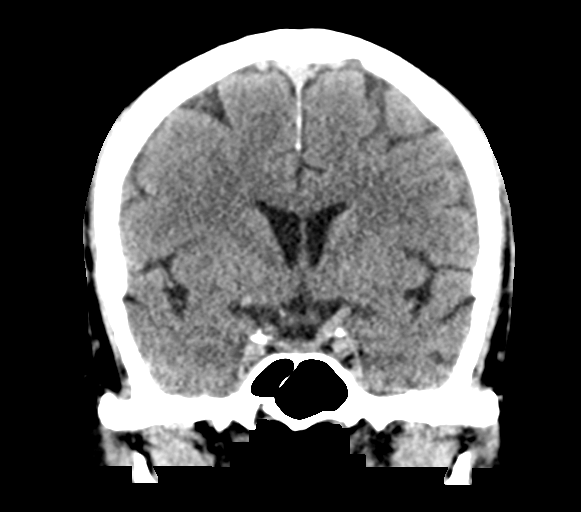
[im 41/74  brain]
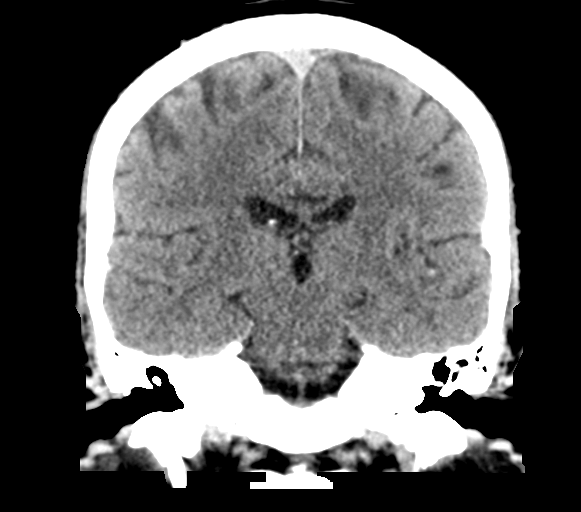

[Series 6: head 3.0 mpr sag · sagittal · 0.32mm/px · 3 of 67 slices shown]
[im 23/67  brain]
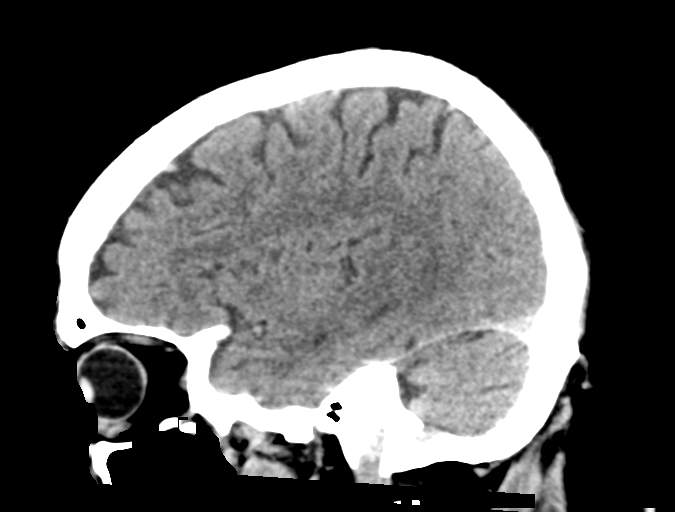
[im 34/67  brain]
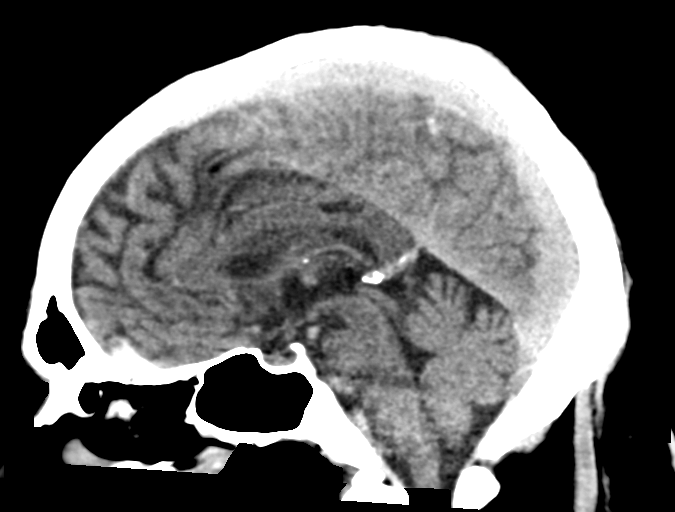
[im 45/67  brain]
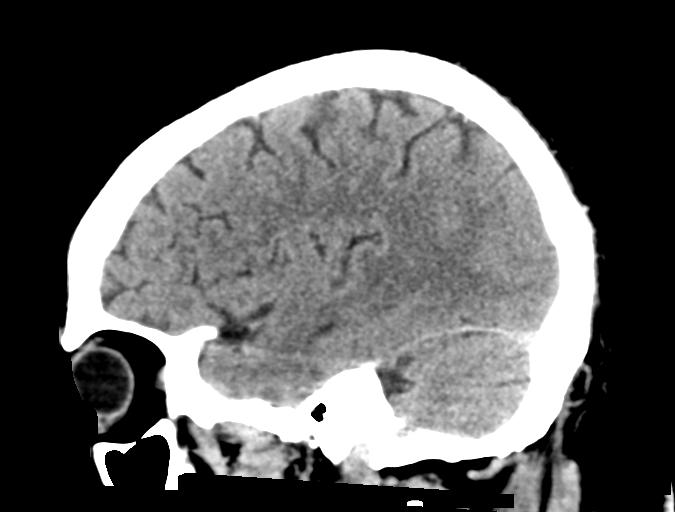

[16 of 47 positions shown; findings below may reference images not displayed]

FINDINGS: Brain: No acute intracranial abnormality. Specifically, no
hemorrhage, hydrocephalus, mass lesion, acute infarction, or
significant intracranial injury.

Vascular: No hyperdense vessel or unexpected calcification.

Skull: No acute calvarial abnormality.

Sinuses/Orbits: No acute findings

Other: None
IMPRESSION: Normal study.

## 2022-05-14 ENCOUNTER — Encounter: Payer: Self-pay | Admitting: Podiatry

## 2022-05-14 ENCOUNTER — Ambulatory Visit (INDEPENDENT_AMBULATORY_CARE_PROVIDER_SITE_OTHER): Payer: Medicaid Other | Admitting: Podiatry

## 2022-05-14 DIAGNOSIS — M79674 Pain in right toe(s): Secondary | ICD-10-CM

## 2022-05-14 DIAGNOSIS — M79675 Pain in left toe(s): Secondary | ICD-10-CM | POA: Diagnosis not present

## 2022-05-14 DIAGNOSIS — E119 Type 2 diabetes mellitus without complications: Secondary | ICD-10-CM | POA: Diagnosis not present

## 2022-05-14 DIAGNOSIS — B351 Tinea unguium: Secondary | ICD-10-CM | POA: Diagnosis not present

## 2022-05-14 DIAGNOSIS — L84 Corns and callosities: Secondary | ICD-10-CM

## 2022-05-14 NOTE — Progress Notes (Signed)
This patient returns to my office for at risk foot care.  This patient requires this care by a professional since this patient will be at risk due to having type 2 diabetes.  This patient is unable to cut nails himself since the patient cannot reach his nails.These nails are painful walking and wearing shoes.  This patient presents for at risk foot care today.  General Appearance  Alert, conversant and in no acute stress.  Vascular  Dorsalis pedis and posterior tibial  pulses are palpable  bilaterally.  Capillary return is within normal limits  bilaterally. Temperature is within normal limits  bilaterally.  Neurologic  Senn-Weinstein monofilament wire test within normal limits  bilaterally. Muscle power within normal limits bilaterally.  Nails Thick disfigured discolored nails with subungual debris  from hallux to fifth toes bilaterally. No evidence of bacterial infection or drainage bilaterally.  Orthopedic  No limitations of motion  feet .  No crepitus or effusions noted.  No bony pathology or digital deformities noted.  Skin  normotropic skin with no porokeratosis noted bilaterally.  No signs of infections or ulcers noted.   Callus sub 3 left foot.  Onychomycosis  Pain in right toes  Pain in left toes  Callus  left foot.  Consent was obtained for treatment procedures.   Mechanical debridement of nails 1-5  bilaterally performed with a nail nipper.  Filed with dremel without incident. Debride callus with dremel tool.   Return office visit     3   months                Told patient to return for periodic foot care and evaluation due to potential at risk complications.   Gardiner Barefoot DPM

## 2022-08-13 ENCOUNTER — Encounter: Payer: Self-pay | Admitting: Podiatry

## 2022-08-13 ENCOUNTER — Ambulatory Visit (INDEPENDENT_AMBULATORY_CARE_PROVIDER_SITE_OTHER): Payer: Medicaid Other | Admitting: Podiatry

## 2022-08-13 DIAGNOSIS — M79674 Pain in right toe(s): Secondary | ICD-10-CM | POA: Diagnosis not present

## 2022-08-13 DIAGNOSIS — B351 Tinea unguium: Secondary | ICD-10-CM | POA: Diagnosis not present

## 2022-08-13 DIAGNOSIS — E119 Type 2 diabetes mellitus without complications: Secondary | ICD-10-CM

## 2022-08-13 DIAGNOSIS — M79675 Pain in left toe(s): Secondary | ICD-10-CM

## 2022-08-13 NOTE — Progress Notes (Signed)
This patient returns to my office for at risk foot care.  This patient requires this care by a professional since this patient will be at risk due to having type 2 diabetes.  This patient is unable to cut nails himself since the patient cannot reach his nails.These nails are painful walking and wearing shoes.  This patient presents for at risk foot care today.  General Appearance  Alert, conversant and in no acute stress.  Vascular  Dorsalis pedis and posterior tibial  pulses are palpable  bilaterally.  Capillary return is within normal limits  bilaterally. Temperature is within normal limits  bilaterally.  Neurologic  Senn-Weinstein monofilament wire test within normal limits  bilaterally. Muscle power within normal limits bilaterally.  Nails Thick disfigured discolored nails with subungual debris  from hallux to fifth toes bilaterally. No evidence of bacterial infection or drainage bilaterally.  Orthopedic  No limitations of motion  feet .  No crepitus or effusions noted.  No bony pathology or digital deformities noted.  Skin  normotropic skin with no porokeratosis noted bilaterally.  No signs of infections or ulcers noted.   Callus sub 3 left foot.  Onychomycosis  Pain in right toes  Pain in left toes  Callus  left foot.  Consent was obtained for treatment procedures.   Mechanical debridement of nails 1-5  bilaterally performed with a nail nipper.  Filed with dremel without incident. Debride callus with dremel tool.   Return office visit     3   months                Told patient to return for periodic foot care and evaluation due to potential at risk complications.   Shayley Medlin DPM   

## 2022-08-14 ENCOUNTER — Ambulatory Visit: Payer: Medicaid Other | Admitting: Podiatry

## 2022-11-04 ENCOUNTER — Ambulatory Visit (HOSPITAL_COMMUNITY)
Admission: EM | Admit: 2022-11-04 | Discharge: 2022-11-04 | Disposition: A | Discharge status: Home / Self Care | Payer: Medicaid Other

## 2022-11-04 ENCOUNTER — Ambulatory Visit (INDEPENDENT_AMBULATORY_CARE_PROVIDER_SITE_OTHER): Payer: Medicaid Other

## 2022-11-04 ENCOUNTER — Encounter (HOSPITAL_COMMUNITY): Payer: Self-pay

## 2022-11-04 DIAGNOSIS — S40012A Contusion of left shoulder, initial encounter: Secondary | ICD-10-CM

## 2022-11-04 DIAGNOSIS — W19XXXA Unspecified fall, initial encounter: Secondary | ICD-10-CM | POA: Diagnosis not present

## 2022-11-04 MED ORDER — IBUPROFEN 800 MG PO TABS
800.0000 mg | ORAL_TABLET | Freq: Once | ORAL | Status: AC
  Administered 2022-11-04: 800 mg via ORAL

## 2022-11-04 MED ORDER — IBUPROFEN 800 MG PO TABS
ORAL_TABLET | ORAL | Status: AC
Start: 2022-11-04 — End: ?
  Filled 2022-11-04: qty 1

## 2022-11-04 MED ORDER — IBUPROFEN 800 MG PO TABS: 800.0000 mg | ORAL_TABLET | Freq: Three times a day (TID) | ORAL | 0 refills | Status: DC

## 2022-11-04 NOTE — Discharge Instructions (Addendum)
The preliminary xray read shows no broken bones We will call you if the radiologist shows something different. Please follow up with [facility] this week.  Stop taking Mobic and Naproxen while on the Ibuprofen

## 2022-11-04 NOTE — ED Triage Notes (Signed)
Patient here today with c/o left shoulder pain after a fall today while mopping. Patient states that he slipped on the wet floor and landed onto left shoulder. He has muscle relaxers and tried taking one of them but did not help. He cannot raise his arm.

## 2022-11-04 NOTE — ED Provider Notes (Signed)
[facility]    CSN: [id number] Arrival date & time: 11/04/22  1834      History   Chief Complaint Chief Complaint  Patient presents with   Fall    HPI Cristian Welch is a 62 y.o. male who presents with L shoulder pain due to slipping on his wet floor at home which he was mopping today and fell right onto it. He can't move his arm up due to pain. Denies paresthesia. Denies hx of every injuring this arm before.     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/2009   ELEVATED BP READING WITHOUT DX HYPERTENSION 11/06/2009   ELEVATED PROSTATE SPECIFIC ANTIGEN 09/18/2009   DIABETES MELLITUS, TYPE II 08/06/2009   GLYCOSURIA 08/06/2009   VENTRAL HERNIA 05/07/2009   RECTAL BLEEDING 05/07/2009   DYSPNEA ON EXERTION 05/07/2009    Past Surgical History:  Procedure Laterality Date   CIRCUMCISION N/A 08/04/2012   Procedure: CIRCUMCISION ADULT;  Surgeon: Milford Cage, MD;  Location: [facility];  Service: Urology;  Laterality: N/A;  90 MIN ALSO PROSTATE NERVE BLOCK    EXCISION OF SKIN TAG N/A 12/01/2013   Procedure: EXCISION OF ANAL SKIN TAG;  Surgeon: Romie Levee, MD;  Location: [facility];  Service: General;  Laterality: N/A;   FOOT SURGERY Left 1994 (approx)   INGUINAL HERNIA REPAIR Right 2000 (approx)   IRRIGATION AND DEBRIDEMENT ABSCESS Right 07/07/2012   Procedure: IRRIGATION AND DEBRIDEMENT ABSCESS;  Surgeon: Milford Cage, MD;  Location: [facility];  Service: Urology;  Laterality: Right;  and scrotal abcesses   LYMPHADENECTOMY Bilateral 10/06/2012   Procedure: WITH BILATERAL PELVIC LYMPH NODE DISSECTION ;  Surgeon: Milford Cage, MD;  Location: [facility];  Service: Urology;  Laterality: Bilateral;   PROSTATE BIOPSY N/A 08/04/2012   Procedure: BIOPSY TRANSRECTAL ULTRASONIC PROSTATE (TUBP);  Surgeon: Milford Cage, MD;  Location: [facility];  Service: Urology;  Laterality: N/A;   ROBOT ASSISTED LAPAROSCOPIC RADICAL PROSTATECTOMY N/A 10/06/2012   Procedure: ROBOTIC ASSISTED LAPAROSCOPIC RADICAL PROSTATECTOMY LEVEL 2;  Surgeon: Milford Cage, MD;  Location: [facility];  Service: Urology;  Laterality: N/A;   TRANSTHORACIC ECHOCARDIOGRAM  06-06-2009   NORMAL LVF AND LVSF/ EF 55-60%       Home Medications    Prior to Admission medications   Medication Sig Start Date End Date Taking? Authorizing Provider  amitriptyline (ELAVIL) 25 MG tablet Take 25 mg by mouth at bedtime as needed. 10/07/22  Yes [provider]  ibuprofen (ADVIL) 800 MG tablet Take 1 tablet (800 mg total) by mouth 3 (three) times daily. 11/04/22  Yes Rodriguez-Southworth, Nettie Elm, PA-C  latanoprost (XALATAN) 0.005 % ophthalmic solution 1 drop at bedtime. 08/19/22  Yes [provider]  OZEMPIC, 1 MG/DOSE, 4 MG/3ML SOPN Inject 1 mg into  the skin once a week. 10/23/22  Yes [provider]  sildenafil (VIAGRA) 100 MG tablet Take 100 mg by mouth daily as needed. 08/08/22  Yes [provider]  ACCU-CHEK GUIDE test strip  11/07/19   [provider]  acetaminophen (TYLENOL) 500 MG tablet Take 2 tablets (1,000 mg total) by mouth every 6 (six) hours as needed. 02/10/18   McDonald, Mia A, PA-C  albuterol (PROVENTIL HFA;VENTOLIN HFA) 108 (90 BASE) MCG/ACT inhaler Inhale 2 puffs into the lungs every 6 (six) hours as needed for wheezing.    [provider]   atorvastatin (LIPITOR) 10 MG tablet Take 10 mg by mouth at bedtime.    [provider]  AZOPT 1 % ophthalmic suspension INSTILL 1 DROP INTO AFFECTED EYE(S) THREE TIMES DAILY 10/21/17   [provider]  cetirizine (ZYRTEC) 10 MG tablet Take 10 mg by mouth daily.    [provider]  cyclobenzaprine (FLEXERIL) 10 MG tablet Take 1 tablet (10 mg total) by mouth 2 (two) times daily as needed for muscle spasms. 02/10/18   McDonald, Mia A, PA-C  dapagliflozin propanediol (FARXIGA) 10 MG TABS tablet Take 10 mg by mouth daily.    [provider]  DORZOLAMIDE HCL OP Apply 1 drop to eye 3 (three) times daily.    [provider]  fluticasone (FLONASE) 50 MCG/ACT nasal spray Place 1 spray into both nostrils daily. 06/12/17   Fayrene Helper, PA-C  glimepiride (AMARYL) 4 MG tablet Take 4 mg by mouth 2 (two) times daily. 04/02/18   [provider]  guaiFENesin (MUCINEX) 600 MG 12 hr tablet Take 1 tablet (600 mg total) by mouth 2 (two) times daily. 05/19/20   Couture, Cortni S, PA-C  HYDROcodone-acetaminophen (NORCO/VICODIN) 5-325 MG tablet Take 1 tablet by mouth every 6 (six) hours as needed for moderate pain. 12/11/17   Hilts, Casimiro Needle, MD  LUMIGAN 0.01 % SOLN INSTILL 1 DROP INTO EACH EYE IN THE EVENING SEPARATE BY AT LEAST 10 MINUTES FROM OTHER INTRAOCULAR PRESSURE REDUCING OPHTHALMIC DRUGS 07/24/18   [provider]  metFORMIN (GLUCOPHAGE) 1000 MG tablet Take 1,000 mg by mouth 2 (two) times daily. 10/26/17   [provider]  methocarbamol (ROBAXIN) 500 MG tablet Take 1 tablet (500 mg total) by mouth every 8 (eight) hours as needed for muscle spasms. 01/23/18   Sabas Sous, MD  oxyCODONE-acetaminophen (PERCOCET) 5-325 MG tablet Take 1 tablet by mouth every 6 (six) hours as needed for severe pain. 08/18/18   Felecia Shelling, DPM  propranolol (INDERAL) 10 MG tablet SMARTSIG:1 Tablet(s) By Mouth Every Evening 09/01/19   [provider]  sodium  chloride (OCEAN) 0.65 % SOLN nasal spray Place 1 spray into both nostrils as needed for congestion. 05/19/20   Couture, Cortni S, PA-C  tamsulosin (FLOMAX) 0.4 MG CAPS capsule Take 0.4 mg by mouth at bedtime. 07/30/21   [provider]  tiZANidine (ZANAFLEX) 4 MG tablet Take 4 mg by mouth 2 (two) times daily as needed. 02/24/18   [provider]  travoprost, benzalkonium, (TRAVATAN) 0.004 % ophthalmic solution Place 1 drop into both eyes at bedtime.    [provider]  Vitamin D, Ergocalciferol, (DRISDOL) 1.25 MG (50000 UNIT) CAPS capsule Take 50,000 Units by mouth once a week. 09/14/19   [provider]    Family History Family History  Problem Relation Age of Onset   Colon cancer Neg Hx    Esophageal cancer Neg Hx    Stomach cancer Neg  Hx    Rectal cancer Neg Hx     Social History Social History   Tobacco Use   Smoking status: Former    Current packs/day: 0.00    Average packs/day: 0.3 packs/day for 20.0 years (5.0 ttl pk-yrs)    Types: Cigarettes    Start date: 02/24/1982    Quit date: 02/24/2002    Years since quitting: 20.7   Smokeless tobacco: Never  Substance Use Topics   Alcohol use: No   Drug use: No    Types: Cocaine, Marijuana, LSD    Comment: last used (approx.  2000)     Allergies   Cephalexin   Review of Systems Review of Systems  As noted in HPI Physical Exam Triage Vital Signs ED Triage Vitals  Encounter Vitals Group     BP 11/04/22 1925 94/60     Systolic BP Percentile --      Diastolic BP Percentile --      Pulse Rate 11/04/22 1925 93     Resp 11/04/22 1925 16     Temp 11/04/22 1925 (!) 97.5 F (36.4 C)     Temp Source 11/04/22 1925 Oral     SpO2 11/04/22 1925 95 %     Weight 11/04/22 1925 223 lb (101.2 kg)     Height 11/04/22 1925 6\' 1"  (1.854 m)     Head Circumference --      Peak Flow --      Pain Score 11/04/22 1924 8     Pain Loc --      Pain Education --      Exclude from Growth Chart --    No data  found.  Updated Vital Signs BP 106/69 (BP Location: Right Arm)   Pulse 93   Temp (!) 97.5 F (36.4 C) (Oral)   Resp 16   Ht 6\' 1"  (1.854 m)   Wt 223 lb (101.2 kg)   SpO2 95%   BMI 29.42 kg/m   Visual Acuity Right Eye Distance:   Left Eye Distance:   Bilateral Distance:    Right Eye Near:   Left Eye Near:    Bilateral Near:     Physical Exam Vitals and nursing note reviewed.  Constitutional:      General: He is not in acute distress.    Appearance: He is not toxic-appearing.  HENT:     Right Ear: External ear normal.     Left Ear: External ear normal.  Eyes:     General: No scleral icterus.    Conjunctiva/sclera: Conjunctivae normal.  Musculoskeletal:     Cervical back: Neck supple.     Comments: L SHOULDER- no deformity or dislocation noted. Does not have deformity or pain on his clavicle. Has tender bicep groove region. Has decreased ROM due to pain.  Humerus is not tender  Skin:    General: Skin is warm and dry.     Findings: No rash.  Neurological:     Mental Status: He is alert and oriented to person, place, and time.     Gait: Gait normal.  Psychiatric:        Mood and Affect: Mood normal.        Behavior: Behavior normal.        Thought Content: Thought content normal.        Judgment: Judgment normal.      UC Treatments / Results  Labs (all labs ordered are listed, but only abnormal results are displayed) Labs Reviewed -  No data to display  EKG   Radiology No results found.  Procedures Procedures (including critical care time)  Medications Ordered in UC Medications  ibuprofen (ADVIL) tablet 800 mg (800 mg Oral Given 11/04/22 2035)    Initial Impression / Assessment and Plan / UC Course  I have reviewed the triage vital signs and the nursing notes. He was given Ibuprofen 800 mg PO here Pertinent  imaging results that were available during my care of the patient were reviewed by me and considered in my medical decision making (see chart  for details).  L shoulder Contusion  He was placed on Ibuprofen as noted and on a sling Needs to FU with Ortho this week   Final Clinical Impressions(s) / UC Diagnoses   Final diagnoses:  Fall, initial encounter  Contusion of left shoulder, initial encounter     Discharge Instructions      The preliminary xray read shows no broken bones We will call you if the radiologist shows something different. Please follow up with [facility] this week.  Stop taking Mobic and Naproxen while on the Ibuprofen     ED Prescriptions     Medication Sig Dispense Auth. Provider   ibuprofen (ADVIL) 800 MG tablet Take 1 tablet (800 mg total) by mouth 3 (three) times daily. 21 tablet Rodriguez-Southworth, Nettie Elm, PA-C      I have reviewed the PDMP during this encounter.   Garey Ham, Cordelia Poche 11/04/22 2037

## 2022-11-06 ENCOUNTER — Emergency Department (HOSPITAL_COMMUNITY)
Admission: EM | Admit: 2022-11-06 | Discharge: 2022-11-06 | Disposition: A | Discharge status: Home / Self Care | Payer: Medicaid Other | Attending: Emergency Medicine | Admitting: Emergency Medicine

## 2022-11-06 DIAGNOSIS — M25512 Pain in left shoulder: Secondary | ICD-10-CM | POA: Insufficient documentation

## 2022-11-06 MED ORDER — HYDROCODONE-ACETAMINOPHEN 5-325 MG PO TABS: 1.0000 | ORAL_TABLET | Freq: Four times a day (QID) | ORAL | 0 refills | Status: DC | PRN

## 2022-11-06 MED ORDER — HYDROCODONE-ACETAMINOPHEN 5-325 MG PO TABS: 1.0000 | ORAL_TABLET | Freq: Four times a day (QID) | ORAL | 0 refills | Status: AC | PRN

## 2022-11-06 MED ORDER — CELECOXIB 200 MG PO CAPS: 200.0000 mg | ORAL_CAPSULE | Freq: Two times a day (BID) | ORAL | 0 refills | Status: AC

## 2022-11-06 NOTE — ED Triage Notes (Signed)
Pt. Stated, I fell on Tuesday on my left shoulder while mopping . Went to UC and they only gave me Ibuprofen, not helping at all. My pain is worse then that. I can't even sleep.

## 2022-11-06 NOTE — Discharge Instructions (Addendum)
Use ice over a towel on the left shoulder to decrease swelling and pain. Call Dr. Hulda Humphrey ([facility]) whose number is listed above to set up an appointment for evaluation of your left shoulder. If you are having trouble getting an appointment anywhere you may also follow-up at [facility] urgent care which [facility] [address].  Do not drive and take the strong pain medication I have ordered.  Do not make important decisions or operate heavy machinery while taking the strong pain medicine, Norco.  You may buy over-the-counter lidocaine patches made by [employer] or another company.  Ply this directly to the shoulder as it may help.  Get help right away if you have any new or worsening conditions.

## 2022-11-06 NOTE — ED Provider Notes (Signed)
[facility] Provider Note   CSN: [id number] Arrival date & time: 11/06/22  1035     History  Chief Complaint  Patient presents with   Shoulder Pain    Cristian Welch is a 63 y.o. male who presents the emergency department chief complaint of left shoulder pain.  Patient had traumatic injury to the left shoulder.  He was seen at urgent care 2 days ago, he had a negative left shoulder x-ray.  I visualized and interpreted plain films ordered 2 days ago. Patient reports that he was given ibuprofen and a sling.  He is having trouble figuring out how to apply the sling at home by himself.  He states that the ibuprofen is not helping with his severe pain.  He reports that he is having to use his right hand to lift the arm.  He also complains that he has been unable to sleep because any movement of the joint or laying on the left side of the shoulder is severe.  He denies numbness tingling or weakness.  HPI     Home Medications Prior to Admission medications   Medication Sig Start Date End Date Taking? Authorizing Provider  ACCU-CHEK GUIDE test strip  11/07/19   [provider]  acetaminophen (TYLENOL) 500 MG tablet Take 2 tablets (1,000 mg total) by mouth every 6 (six) hours as needed. 02/10/18   McDonald, Mia A, PA-C  albuterol (PROVENTIL HFA;VENTOLIN HFA) 108 (90 BASE) MCG/ACT inhaler Inhale 2 puffs into the lungs every 6 (six) hours as needed for wheezing.    [provider]  amitriptyline (ELAVIL) 25 MG tablet Take 25 mg by mouth at bedtime as needed. 10/07/22   [provider]  atorvastatin (LIPITOR) 10 MG tablet Take 10 mg by mouth at bedtime.    [provider]  AZOPT 1 % ophthalmic suspension INSTILL 1 DROP INTO AFFECTED EYE(S) THREE TIMES DAILY 10/21/17   [provider]  cetirizine (ZYRTEC) 10 MG tablet Take 10 mg by mouth daily.    [provider]  cyclobenzaprine (FLEXERIL) 10 MG tablet  Take 1 tablet (10 mg total) by mouth 2 (two) times daily as needed for muscle spasms. 02/10/18   McDonald, Mia A, PA-C  dapagliflozin propanediol (FARXIGA) 10 MG TABS tablet Take 10 mg by mouth daily.    [provider]  DORZOLAMIDE HCL OP Apply 1 drop to eye 3 (three) times daily.    [provider]  fluticasone (FLONASE) 50 MCG/ACT nasal spray Place 1 spray into both nostrils daily. 06/12/17   Fayrene Helper, PA-C  glimepiride (AMARYL) 4 MG tablet Take 4 mg by mouth 2 (two) times daily. 04/02/18   [provider]  guaiFENesin (MUCINEX) 600 MG 12 hr tablet Take 1 tablet (600 mg total) by mouth 2 (two) times daily. 05/19/20   Couture, Cortni S, PA-C  HYDROcodone-acetaminophen (NORCO/VICODIN) 5-325 MG tablet Take 1 tablet by mouth every 6 (six) hours as needed for moderate pain. 12/11/17   Hilts, Casimiro Needle, MD  ibuprofen (ADVIL) 800 MG tablet Take 1 tablet (800 mg total) by mouth 3 (three) times daily. 11/04/22   Rodriguez-Southworth, Nettie Elm, PA-C  latanoprost (XALATAN) 0.005 % ophthalmic solution 1 drop at bedtime. 08/19/22   [provider]  LUMIGAN 0.01 % SOLN INSTILL 1 DROP INTO EACH EYE IN THE EVENING SEPARATE BY AT LEAST 10 MINUTES FROM OTHER INTRAOCULAR PRESSURE REDUCING OPHTHALMIC DRUGS 07/24/18   [provider]  metFORMIN (GLUCOPHAGE) 1000 MG tablet  Take 1,000 mg by mouth 2 (two) times daily. 10/26/17   [provider]  methocarbamol (ROBAXIN) 500 MG tablet Take 1 tablet (500 mg total) by mouth every 8 (eight) hours as needed for muscle spasms. 01/23/18   Sabas Sous, MD  oxyCODONE-acetaminophen (PERCOCET) 5-325 MG tablet Take 1 tablet by mouth every 6 (six) hours as needed for severe pain. 08/18/18   Felecia Shelling, DPM  OZEMPIC, 1 MG/DOSE, 4 MG/3ML SOPN Inject 1 mg into the skin once a week. 10/23/22   [provider]  propranolol (INDERAL) 10 MG tablet SMARTSIG:1 Tablet(s) By Mouth Every Evening 09/01/19   [provider]   sildenafil (VIAGRA) 100 MG tablet Take 100 mg by mouth daily as needed. 08/08/22   [provider]  sodium chloride (OCEAN) 0.65 % SOLN nasal spray Place 1 spray into both nostrils as needed for congestion. 05/19/20   Couture, Cortni S, PA-C  tamsulosin (FLOMAX) 0.4 MG CAPS capsule Take 0.4 mg by mouth at bedtime. 07/30/21   [provider]  tiZANidine (ZANAFLEX) 4 MG tablet Take 4 mg by mouth 2 (two) times daily as needed. 02/24/18   [provider]  travoprost, benzalkonium, (TRAVATAN) 0.004 % ophthalmic solution Place 1 drop into both eyes at bedtime.    [provider]  Vitamin D, Ergocalciferol, (DRISDOL) 1.25 MG (50000 UNIT) CAPS capsule Take 50,000 Units by mouth once a week. 09/14/19   [provider]      Allergies    Cephalexin    Review of Systems   Review of Systems  Physical Exam Updated Vital Signs BP 119/89 (BP Location: Right Arm)   Pulse 94   Temp 97.8 F (36.6 C) (Oral)   Resp 16   SpO2 98%  Physical Exam Vitals and nursing note reviewed.  Constitutional:      General: He is not in acute distress.    Appearance: He is well-developed. He is not diaphoretic.  HENT:     Head: Normocephalic and atraumatic.  Eyes:     General: No scleral icterus.    Conjunctiva/sclera: Conjunctivae normal.  Cardiovascular:     Rate and Rhythm: Normal rate and regular rhythm.     Heart sounds: Normal heart sounds.  Pulmonary:     Effort: Pulmonary effort is normal. No respiratory distress.     Breath sounds: Normal breath sounds.  Abdominal:     Palpations: Abdomen is soft.     Tenderness: There is no abdominal tenderness.  Musculoskeletal:     Cervical back: Normal range of motion and neck supple.     Comments: No swelling bruising or deformity noted.  Patient is exquisitely tender over the medial portion of the deltoid muscle.  He is unable to abduct the shoulder without severe pain even minimally.  No obvious deformities, no tenderness  in the bicep tendon, normal ipsilateral left elbow and wrist examination, no tenderness along the clavicle.  Skin:    General: Skin is warm and dry.  Neurological:     Mental Status: He is alert.  Psychiatric:        Behavior: Behavior normal.     ED Results / Procedures / Treatments   Labs (all labs ordered are listed, but only abnormal results are displayed) Labs Reviewed - No data to display  EKG None  Radiology DG Shoulder Left  Result Date: 11/04/2022 CLINICAL DATA:  Fall and trauma to the left shoulder.  Pain. EXAM: LEFT SHOULDER - 2+ VIEW COMPARISON:  Left  shoulder radiograph dated 09/10/2020. FINDINGS: There is no acute fracture or dislocation. Mild arthritic changes of the left AC joint. The soft tissues are unremarkable. IMPRESSION: No acute fracture or dislocation. Electronically Signed   By: Elgie Collard M.D.   On: 11/04/2022 20:37    Procedures Procedures    Medications Ordered in ED Medications - No data to display  ED Course/ Medical Decision Making/ A&P                                 Medical Decision Making Patient here for pain control.  PDMP reviewed during this visit.  Will give the patient a very short course of pain medicine to help with sleeping.  I discussed how to use the sling.  He is advised that he needs to make an appointment for the orthopedist which he states he was unaware he needed to do.  I do not think there is any need for further evaluation.  He has no significant swelling in the arm suggestive of DVT in the left upper extremity or other acute finding.  Suspect he likely has a rotator cuff injury.          Final Clinical Impression(s) / ED Diagnoses Final diagnoses:  None    Rx / DC Orders ED Discharge Orders     None         Arthor Captain, PA-C 11/06/22 1317    Royanne Foots, DO 11/13/22 1614

## 2022-11-13 ENCOUNTER — Encounter: Payer: Self-pay | Admitting: Podiatry

## 2022-11-13 ENCOUNTER — Ambulatory Visit (INDEPENDENT_AMBULATORY_CARE_PROVIDER_SITE_OTHER): Payer: Medicaid Other | Admitting: Podiatry

## 2022-11-13 DIAGNOSIS — E119 Type 2 diabetes mellitus without complications: Secondary | ICD-10-CM | POA: Diagnosis not present

## 2022-11-13 DIAGNOSIS — B351 Tinea unguium: Secondary | ICD-10-CM

## 2022-11-13 DIAGNOSIS — M79675 Pain in left toe(s): Secondary | ICD-10-CM | POA: Diagnosis not present

## 2022-11-13 DIAGNOSIS — M79674 Pain in right toe(s): Secondary | ICD-10-CM | POA: Diagnosis not present

## 2022-11-13 NOTE — Progress Notes (Signed)
This patient returns to my office for at risk foot care.  This patient requires this care by a professional since this patient will be at risk due to having type 2 diabetes.  This patient is unable to cut nails himself since the patient cannot reach his nails.These nails are painful walking and wearing shoes.  This patient presents for at risk foot care today.  General Appearance  Alert, conversant and in no acute stress.  Vascular  Dorsalis pedis and posterior tibial  pulses are palpable  bilaterally.  Capillary return is within normal limits  bilaterally. Temperature is within normal limits  bilaterally.  Neurologic  Senn-Weinstein monofilament wire test within normal limits  bilaterally. Muscle power within normal limits bilaterally.  Nails Thick disfigured discolored nails with subungual debris  from hallux to fifth toes bilaterally. No evidence of bacterial infection or drainage bilaterally.  Orthopedic  No limitations of motion  feet .  No crepitus or effusions noted.  No bony pathology or digital deformities noted.  Skin  normotropic skin with no porokeratosis noted bilaterally.  No signs of infections or ulcers noted.   Callus sub 3 left foot asymptomatic.  Onychomycosis  Pain in right toes  Pain in left toes  Callus  left foot.  Consent was obtained for treatment procedures.   Mechanical debridement of nails 1-5  bilaterally performed with a nail nipper.  Filed with dremel without incident. Debride callus with dremel tool.   Return office visit     3   months                Told patient to return for periodic foot care and evaluation due to potential at risk complications.   Helane Gunther DPM

## 2023-02-12 ENCOUNTER — Ambulatory Visit (INDEPENDENT_AMBULATORY_CARE_PROVIDER_SITE_OTHER): Payer: Medicaid Other | Admitting: Podiatry

## 2023-02-12 ENCOUNTER — Encounter: Payer: Self-pay | Admitting: Podiatry

## 2023-02-12 DIAGNOSIS — M79675 Pain in left toe(s): Secondary | ICD-10-CM

## 2023-02-12 DIAGNOSIS — E119 Type 2 diabetes mellitus without complications: Secondary | ICD-10-CM

## 2023-02-12 DIAGNOSIS — M79674 Pain in right toe(s): Secondary | ICD-10-CM

## 2023-02-12 DIAGNOSIS — B351 Tinea unguium: Secondary | ICD-10-CM | POA: Diagnosis not present

## 2023-02-12 NOTE — Progress Notes (Signed)
This patient returns to my office for at risk foot care.  This patient requires this care by a professional since this patient will be at risk due to having type 2 diabetes.  This patient is unable to cut nails himself since the patient cannot reach his nails.These nails are painful walking and wearing shoes.  This patient presents for at risk foot care today.  General Appearance  Alert, conversant and in no acute stress.  Vascular  Dorsalis pedis and posterior tibial  pulses are palpable  bilaterally.  Capillary return is within normal limits  bilaterally. Temperature is within normal limits  bilaterally.  Neurologic  Senn-Weinstein monofilament wire test within normal limits  bilaterally. Muscle power within normal limits bilaterally.  Nails Thick disfigured discolored nails with subungual debris  from hallux to fifth toes bilaterally. No evidence of bacterial infection or drainage bilaterally.  Orthopedic  No limitations of motion  feet .  No crepitus or effusions noted.  No bony pathology or digital deformities noted.  Skin  normotropic skin with no porokeratosis noted bilaterally.  No signs of infections or ulcers noted.   Callus sub 3 left foot asymptomatic.  Onychomycosis  Pain in right toes  Pain in left toes    Consent was obtained for treatment procedures.   Mechanical debridement of nails 1-5  bilaterally performed with a nail nipper.  Filed with dremel without incident. Debride callus with dremel tool.   Return office visit     3   months                Told patient to return for periodic foot care and evaluation due to potential at risk complications.   Helane Gunther DPM

## 2023-05-20 ENCOUNTER — Ambulatory Visit (INDEPENDENT_AMBULATORY_CARE_PROVIDER_SITE_OTHER): Payer: Medicaid Other | Admitting: Podiatry

## 2023-05-20 ENCOUNTER — Encounter: Payer: Self-pay | Admitting: Podiatry

## 2023-05-20 DIAGNOSIS — E119 Type 2 diabetes mellitus without complications: Secondary | ICD-10-CM | POA: Diagnosis not present

## 2023-05-20 DIAGNOSIS — M79675 Pain in left toe(s): Secondary | ICD-10-CM

## 2023-05-20 DIAGNOSIS — M79674 Pain in right toe(s): Secondary | ICD-10-CM | POA: Diagnosis not present

## 2023-05-20 DIAGNOSIS — B351 Tinea unguium: Secondary | ICD-10-CM

## 2023-05-20 NOTE — Progress Notes (Unsigned)
 This patient returns to my office for at risk foot care.  This patient requires this care by a professional since this patient will be at risk due to having type 2 diabetes.  This patient is unable to cut nails himself since the patient cannot reach his nails.These nails are painful walking and wearing shoes.  This patient presents for at risk foot care today.  General Appearance  Alert, conversant and in no acute stress.  Vascular  Dorsalis pedis and posterior tibial  pulses are palpable  bilaterally.  Capillary return is within normal limits  bilaterally. Temperature is within normal limits  bilaterally.  Neurologic  Senn-Weinstein monofilament wire test within normal limits  bilaterally. Muscle power within normal limits bilaterally.  Nails Thick disfigured discolored nails with subungual debris  from hallux to fifth toes bilaterally. No evidence of bacterial infection or drainage bilaterally.  Orthopedic  No limitations of motion  feet .  No crepitus or effusions noted.  No bony pathology or digital deformities noted.  Skin  normotropic skin with no porokeratosis noted bilaterally.  No signs of infections or ulcers noted.   Callus sub 3 left foot asymptomatic.  Onychomycosis  Pain in right toes  Pain in left toes    Consent was obtained for treatment procedures.   Mechanical debridement of nails 1-5  bilaterally performed with a nail nipper.  Filed with dremel without incident. Debride callus with dremel tool.   Return office visit     3   months                Told patient to return for periodic foot care and evaluation due to potential at risk complications.   Helane Gunther DPM

## 2023-06-20 ENCOUNTER — Encounter (HOSPITAL_COMMUNITY): Payer: Self-pay | Admitting: Emergency Medicine

## 2023-06-20 ENCOUNTER — Other Ambulatory Visit: Payer: Self-pay

## 2023-06-20 ENCOUNTER — Emergency Department (HOSPITAL_COMMUNITY)
Admission: EM | Admit: 2023-06-20 | Discharge: 2023-06-20 | Disposition: A | Discharge status: Home / Self Care | Attending: Emergency Medicine | Admitting: Emergency Medicine

## 2023-06-20 DIAGNOSIS — Z794 Long term (current) use of insulin: Secondary | ICD-10-CM | POA: Diagnosis not present

## 2023-06-20 DIAGNOSIS — Z7984 Long term (current) use of oral hypoglycemic drugs: Secondary | ICD-10-CM | POA: Insufficient documentation

## 2023-06-20 DIAGNOSIS — Z8546 Personal history of malignant neoplasm of prostate: Secondary | ICD-10-CM | POA: Diagnosis not present

## 2023-06-20 DIAGNOSIS — Z7951 Long term (current) use of inhaled steroids: Secondary | ICD-10-CM | POA: Insufficient documentation

## 2023-06-20 DIAGNOSIS — E119 Type 2 diabetes mellitus without complications: Secondary | ICD-10-CM | POA: Diagnosis not present

## 2023-06-20 DIAGNOSIS — K625 Hemorrhage of anus and rectum: Secondary | ICD-10-CM | POA: Insufficient documentation

## 2023-06-20 DIAGNOSIS — Z87891 Personal history of nicotine dependence: Secondary | ICD-10-CM | POA: Diagnosis not present

## 2023-06-20 DIAGNOSIS — J45909 Unspecified asthma, uncomplicated: Secondary | ICD-10-CM | POA: Diagnosis not present

## 2023-06-20 LAB — COMPREHENSIVE METABOLIC PANEL WITH GFR
ALT: 38 U/L (ref 0–44)
AST: 30 U/L (ref 15–41)
Albumin: 3.6 g/dL (ref 3.5–5.0)
Alkaline Phosphatase: 41 U/L (ref 38–126)
Anion gap: 3 — ABNORMAL LOW (ref 5–15)
BUN: 16 mg/dL (ref 8–23)
CO2: 29 mmol/L (ref 22–32)
Calcium: 9.4 mg/dL (ref 8.9–10.3)
Chloride: 107 mmol/L (ref 98–111)
Creatinine, Ser: 0.79 mg/dL (ref 0.61–1.24)
GFR, Estimated: >60 mL/min (ref >60)
Glucose, Bld: 128 mg/dL — ABNORMAL HIGH (ref 70–99)
Potassium: 3.9 mmol/L (ref 3.5–5.1)
Sodium: 139 mmol/L (ref 135–145)
Total Bilirubin: 0.7 mg/dL (ref 0.0–1.2)
Total Protein: 6.4 g/dL — ABNORMAL LOW (ref 6.5–8.1)

## 2023-06-20 LAB — CBC
HCT: 44.4 % (ref 39.0–52.0)
Hemoglobin: 14.7 g/dL (ref 13.0–17.0)
MCH: 29.8 pg (ref 26.0–34.0)
MCHC: 33.1 g/dL (ref 30.0–36.0)
MCV: 90.1 fL (ref 80.0–100.0)
Platelets: 167 10*3/uL (ref 150–400)
RBC: 4.93 MIL/uL (ref 4.22–5.81)
RDW: 12.6 % (ref 11.5–15.5)
WBC: 5.6 10*3/uL (ref 4.0–10.5)
nRBC: 0 % (ref 0.0–0.2)

## 2023-06-20 LAB — POC OCCULT BLOOD, ED: Fecal Occult Bld: POSITIVE — AB

## 2023-06-20 LAB — TYPE AND SCREEN
ABO/RH(D): A POS
Antibody Screen: NEGATIVE

## 2023-06-20 NOTE — ED Notes (Signed)
 Discharge instructions reviewed with patient. Patient questions answered and opportunity for education reviewed. Patient voices understanding of discharge instructions with no further questions. Patient ambulatory with steady gait to lobby.

## 2023-06-20 NOTE — ED Triage Notes (Addendum)
 Pt arrives ambulatory by POV c/o red stools - a couple days ago he ate some spicy sausage. Pt noticed some blood in the his stool and also on the toilet paper. Pt denies any abd pain. Pt denies any weakness/lightheadedness. Pt feels better since BM.

## 2023-06-20 NOTE — Discharge Instructions (Signed)
 We evaluated you for your blood in your stool.  Your rectal exam did show some blood when we tested it in the lab.  Your red blood cell count was normal, so we do not think you need to stay in the hospital right now.  However, please follow-up with a gastroenterologist, as you may need a colonoscopy.  If you develop any new or worsening symptoms such as large amount of blood in your stool, lightheadedness or dizziness, fainting, or abdominal pain, please return to the emergency department.

## 2023-06-20 NOTE — ED Provider Notes (Signed)
 [place] EMERGENCY DEPARTMENT AT [facility] Provider Note  CSN: [id number] Arrival date & time: 06/20/23 1750  Chief Complaint(s) Blood In Stools  HPI Maleke Camfield is a 63 y.o. male with history of diabetes presenting to the emergency department with rectal bleeding.  Reports that he noticed blood in his stool today.  It was mixed in with the stool.  He thought it might be due to eating hot sauce recently which is also red.  No lightheadedness or dizziness, abdominal pain, fevers or chills, rectal pain.  Does not take any blood thinner.   Past Medical History Past Medical History:  Diagnosis Date   Allergic rhinitis    Arthritis    At risk for sleep apnea    STOP-BANG= 4    SENT TO PCP 11-28-2013   Exercise-induced asthma    GERD (gastroesophageal reflux disease)    Glaucoma    History of MRSA infection    05/ 2014--  POSITIVE MRSA CULTURE SCROTAL ABSCESS   Prostate cancer [facility])    s/p  prostatectomy 08/ 2014   Skin tag of anus    Type 2 diabetes mellitus [facility])    Patient Active Problem List   Diagnosis Date Noted   Hemorrhoids 05/13/2012   ONYCHOMYCOSIS, TOENAILS 11/06/2009   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 Authorizing Provider  ACCU-CHEK GUIDE test strip  11/07/19   [provider]  acetaminophen  (TYLENOL ) 500 MG tablet Take 2 tablets (1,000 mg total) by mouth every 6 (six) hours as needed. 02/10/18   McDonald, Mia A, PA-C  albuterol  (PROVENTIL  HFA;VENTOLIN  HFA) 108 (90 BASE) MCG/ACT inhaler Inhale 2 puffs into the lungs every 6 (six) hours as needed for wheezing.    [provider]   amitriptyline (ELAVIL) 25 MG tablet Take 25 mg by mouth at bedtime as needed. 10/07/22   [provider]  atorvastatin (LIPITOR) 10 MG tablet Take 10 mg by mouth at bedtime.    [provider]  AZOPT 1 % ophthalmic suspension INSTILL 1 DROP INTO AFFECTED EYE(S) THREE TIMES DAILY 10/21/17   [provider]  celecoxib  (CELEBREX ) 200 MG capsule Take 1 capsule (200 mg total) by mouth 2 (two) times daily. 11/06/22   Harris, Abigail, PA-C  cetirizine (ZYRTEC) 10 MG tablet Take 10 mg by mouth daily.    [provider]  cyclobenzaprine  (FLEXERIL ) 10 MG tablet Take 1 tablet (10 mg total) by mouth 2 (two) times daily as needed for muscle spasms. 02/10/18   McDonald, Mia A, PA-C  dapagliflozin propanediol (FARXIGA) 10 MG TABS tablet Take 10 mg by mouth daily.    [provider]  DORZOLAMIDE  HCL OP Apply 1 drop to eye 3 (three) times daily.    [provider]  fluticasone  (FLONASE ) 50 MCG/ACT nasal spray Place 1 spray into both nostrils daily. 06/12/17   Debbra Fairy, PA-C  glimepiride (AMARYL) 4 MG tablet Take 4 mg by mouth 2 (two) times daily. 04/02/18   [provider]  guaiFENesin  (MUCINEX ) 600 MG 12 hr tablet Take 1 tablet (600 mg total) by mouth 2 (two) times daily. 05/19/20   Couture, Cortni S, PA-C  HYDROcodone -acetaminophen  (NORCO) 5-325  MG tablet Take 1 tablet by mouth every 6 (six) hours as needed for moderate pain or severe pain. 11/06/22   Harris, Abigail, PA-C  latanoprost (XALATAN) 0.005 % ophthalmic solution 1 drop at bedtime. 08/19/22   [provider]  LUMIGAN 0.01 % SOLN INSTILL 1 DROP INTO EACH EYE IN THE EVENING SEPARATE BY AT LEAST 10 MINUTES FROM OTHER INTRAOCULAR PRESSURE REDUCING OPHTHALMIC DRUGS 07/24/18   [provider]  metFORMIN  (GLUCOPHAGE ) 1000 MG tablet Take 1,000 mg by mouth 2 (two) times daily. 10/26/17   [provider]  methocarbamol  (ROBAXIN ) 500 MG tablet Take 1 tablet (500 mg total) by mouth  every 8 (eight) hours as needed for muscle spasms. 01/23/18   Edson Graces, MD  oxyCODONE -acetaminophen  (PERCOCET) 5-325 MG tablet Take 1 tablet by mouth every 6 (six) hours as needed for severe pain. 08/18/18   Dot Gazella, DPM  OZEMPIC, 1 MG/DOSE, 4 MG/3ML SOPN Inject 1 mg into the skin once a week. 10/23/22   [provider]  propranolol (INDERAL) 10 MG tablet SMARTSIG:1 Tablet(s) By Mouth Every Evening 09/01/19   [provider]  sildenafil (VIAGRA) 100 MG tablet Take 100 mg by mouth daily as needed. 08/08/22   [provider]  sodium chloride  (OCEAN) 0.65 % SOLN nasal spray Place 1 spray into both nostrils as needed for congestion. 05/19/20   Couture, Cortni S, PA-C  tamsulosin (FLOMAX) 0.4 MG CAPS capsule Take 0.4 mg by mouth at bedtime. 07/30/21   [provider]  tiZANidine (ZANAFLEX) 4 MG tablet Take 4 mg by mouth 2 (two) times daily as needed. 02/24/18   [provider]  travoprost , benzalkonium, (TRAVATAN ) 0.004 % ophthalmic solution Place 1 drop into both eyes at bedtime.    [provider]  Vitamin D, Ergocalciferol, (DRISDOL) 1.25 MG (50000 UNIT) CAPS capsule Take 50,000 Units by mouth once a week. 09/14/19   [provider]                                                                                                                                    Past Surgical History Past Surgical History:  Procedure Laterality Date   CIRCUMCISION N/A 08/04/2012   Procedure: CIRCUMCISION ADULT;  Surgeon: Soledad Dupes, MD;  Location: [facility];  Service: Urology;  Laterality: N/A;  90 MIN ALSO PROSTATE NERVE BLOCK    EXCISION OF SKIN TAG N/A 12/01/2013   Procedure: EXCISION OF ANAL SKIN TAG;  Surgeon: Joyce Nixon, MD;  Location: [facility];  Service: General;  Laterality: N/A;   FOOT SURGERY Left 1994 (approx)   INGUINAL HERNIA REPAIR Right 2000 (approx)   IRRIGATION AND DEBRIDEMENT ABSCESS  Right 07/07/2012   Procedure: IRRIGATION AND DEBRIDEMENT ABSCESS;  Surgeon: Soledad Dupes, MD;  Location: [facility];  Service: Urology;  Laterality: Right;  and scrotal abcesses   LYMPHADENECTOMY Bilateral 10/06/2012  Procedure: WITH BILATERAL PELVIC LYMPH NODE DISSECTION ;  Surgeon: Soledad Dupes, MD;  Location: [facility];  Service: Urology;  Laterality: Bilateral;   PROSTATE BIOPSY N/A 08/04/2012   Procedure: BIOPSY TRANSRECTAL ULTRASONIC PROSTATE (TUBP);  Surgeon: Soledad Dupes, MD;  Location: [facility];  Service: Urology;  Laterality: N/A;   ROBOT ASSISTED LAPAROSCOPIC RADICAL PROSTATECTOMY N/A 10/06/2012   Procedure: ROBOTIC ASSISTED LAPAROSCOPIC RADICAL PROSTATECTOMY LEVEL 2;  Surgeon: Soledad Dupes, MD;  Location: [facility];  Service: Urology;  Laterality: N/A;   TRANSTHORACIC ECHOCARDIOGRAM  06-06-2009   NORMAL LVF AND LVSF/ EF 55-60%   Family History Family History  Problem Relation Age of Onset   Colon cancer Neg Hx    Esophageal cancer Neg Hx    Stomach cancer Neg Hx    Rectal cancer Neg Hx     Social History Social History   Tobacco Use   Smoking status: Former    Current packs/day: 0.00    Average packs/day: 0.3 packs/day for 20.0 years (5.0 ttl pk-yrs)    Types: Cigarettes    Start date: 02/24/1982    Quit date: 02/24/2002    Years since quitting: 21.3   Smokeless tobacco: Never  Vaping Use   Vaping status: Never Used  Substance Use Topics   Alcohol use: No   Drug use: No    Types: Cocaine, Marijuana, LSD    Comment: last used (approx.  2000)   Allergies Cephalexin  Review of Systems Review of Systems  All other systems reviewed and are negative.   Physical Exam Vital Signs  I have reviewed the triage vital signs BP 128/83 (BP Location: Right Arm)   Pulse 94   Temp 98.4 F (36.9 C) (Oral)   Resp 18   Ht 6\' 1"  (1.854 m)   Wt 92.5 kg   SpO2 100%   BMI 26.91 kg/m  Physical Exam Vitals and  nursing note reviewed.  Constitutional:      General: He is not in acute distress.    Appearance: Normal appearance.  HENT:     Mouth/Throat:     Mouth: Mucous membranes are moist.  Eyes:     Conjunctiva/sclera: Conjunctivae normal.  Cardiovascular:     Rate and Rhythm: Normal rate and regular rhythm.  Pulmonary:     Effort: Pulmonary effort is normal. No respiratory distress.     Breath sounds: Normal breath sounds.  Abdominal:     General: Abdomen is flat.     Palpations: Abdomen is soft.     Tenderness: There is no abdominal tenderness.  Genitourinary:    Comments: Chaperoned by RN.  External rectal exam normal.  Internal rectal exam with small amount of maroon stool Musculoskeletal:     Right lower leg: No edema.     Left lower leg: No edema.  Skin:    General: Skin is warm and dry.     Capillary Refill: Capillary refill takes less than 2 seconds.  Neurological:     Mental Status: He is alert and oriented to person, place, and time. Mental status is at baseline.  Psychiatric:        Mood and Affect: Mood normal.        Behavior: Behavior normal.     ED Results and Treatments Labs (all labs ordered are listed, but only abnormal results are displayed) Labs Reviewed  COMPREHENSIVE METABOLIC PANEL WITH GFR - Abnormal; Notable for the following components:      Result Value  Glucose, Bld 128 (*)    Total Protein 6.4 (*)    Anion gap 3 (*)    All other components within normal limits  POC OCCULT BLOOD, ED - Abnormal; Notable for the following components:   Fecal Occult Bld POSITIVE (*)    All other components within normal limits  CBC  POC OCCULT BLOOD, ED  TYPE AND SCREEN                                                                                                                          Radiology No results found.  Pertinent labs & imaging results that were available during my care of the patient were reviewed by me and considered in my medical decision  making (see MDM for details).  Medications Ordered in ED Medications - No data to display                                                                                                                                   Procedures Procedures  (including critical care time)  Medical Decision Making / ED Course   MDM:  63 year old presenting to the emergency department with rectal bleeding.  Patient overall well-appearing, vital signs reassuring.  Rectal exam does reveal some maroon-colored stool which is occult positive.  Patient is not on any anticoagulation.  Differential includes AVM, diverticulosis, internal hemorrhoid.  Given reassuring hemoglobin, stable vitals, and patient not on any anticoagulation, feels patient is stable for discharge.  Please follow-up with GI previously saw recommended he follow-up with GI.  Will discharge patient to home. All questions answered. Patient comfortable with plan of discharge. Return precautions discussed with patient and specified on the after visit summary.       Additional history obtained:  -External records from outside source obtained and reviewed including: Chart review including previous notes, labs, imaging, consultation notes including prior notes    Lab Tests: -I ordered, reviewed, and interpreted labs.   The pertinent results include:   Labs Reviewed  COMPREHENSIVE METABOLIC PANEL WITH GFR - Abnormal; Notable for the following components:      Result Value   Glucose, Bld 128 (*)    Total Protein 6.4 (*)    Anion gap 3 (*)    All other components within normal limits  POC OCCULT BLOOD, ED - Abnormal; Notable for the following components:   Fecal Occult Bld POSITIVE (*)  All other components within normal limits  CBC  POC OCCULT BLOOD, ED  TYPE AND SCREEN    Notable for +FOBT   Medicines ordered and prescription drug management: No orders of the defined types were placed in this encounter.   -I have reviewed the  patients home medicines and have made adjustments as needed  Co morbidities that complicate the patient evaluation  Past Medical History:  Diagnosis Date   Allergic rhinitis    Arthritis    At risk for sleep apnea    STOP-BANG= 4    SENT TO PCP 11-28-2013   Exercise-induced asthma    GERD (gastroesophageal reflux disease)    Glaucoma    History of MRSA infection    05/ 2014--  POSITIVE MRSA CULTURE SCROTAL ABSCESS   Prostate cancer [facility])    s/p  prostatectomy 08/ 2014   Skin tag of anus    Type 2 diabetes mellitus (HCC)       Dispostion: Disposition decision including need for hospitalization was considered, and patient discharged from emergency department.    Final Clinical Impression(s) / ED Diagnoses Final diagnoses:  Rectal bleeding     This chart was dictated using voice recognition software.  Despite best efforts to proofread,  errors can occur which can change the documentation meaning.    Mordecai Applebaum, MD 06/20/23 [phone]

## 2023-06-22 ENCOUNTER — Encounter: Payer: Self-pay | Admitting: Gastroenterology

## 2023-07-02 ENCOUNTER — Encounter: Payer: Self-pay | Admitting: Gastroenterology

## 2023-08-14 ENCOUNTER — Ambulatory Visit: Admitting: Gastroenterology

## 2023-08-20 ENCOUNTER — Ambulatory Visit (INDEPENDENT_AMBULATORY_CARE_PROVIDER_SITE_OTHER): Admitting: Podiatry

## 2023-08-20 DIAGNOSIS — B351 Tinea unguium: Secondary | ICD-10-CM

## 2023-08-20 DIAGNOSIS — E119 Type 2 diabetes mellitus without complications: Secondary | ICD-10-CM

## 2023-08-20 DIAGNOSIS — M79675 Pain in left toe(s): Secondary | ICD-10-CM

## 2023-08-20 DIAGNOSIS — M79674 Pain in right toe(s): Secondary | ICD-10-CM

## 2023-08-20 NOTE — Progress Notes (Signed)
 This patient returns to my office for at risk foot care.  This patient requires this care by a professional since this patient will be at risk due to having type 2 diabetes.  This patient is unable to cut nails himself since the patient cannot reach his nails.These nails are painful walking and wearing shoes.  This patient presents for at risk foot care today.  General Appearance  Alert, conversant and in no acute stress.  Vascular  Dorsalis pedis and posterior tibial  pulses are palpable  bilaterally.  Capillary return is within normal limits  bilaterally. Temperature is within normal limits  bilaterally.  Neurologic  Senn-Weinstein monofilament wire test within normal limits  bilaterally. Muscle power within normal limits bilaterally.  Nails Thick disfigured discolored nails with subungual debris  from hallux to fifth toes bilaterally. No evidence of bacterial infection or drainage bilaterally.  Orthopedic  No limitations of motion  feet .  No crepitus or effusions noted.  No bony pathology or digital deformities noted.  Skin  normotropic skin with no porokeratosis noted bilaterally.  No signs of infections or ulcers noted.   Callus sub 3 left foot asymptomatic.  Onychomycosis  Pain in right toes  Pain in left toes    Consent was obtained for treatment procedures.   Mechanical debridement of nails 1-5  bilaterally performed with a nail nipper.  Filed with dremel without incident. Debride callus with dremel tool.   Return office visit     3   months                Told patient to return for periodic foot care and evaluation due to potential at risk complications.   Helane Gunther DPM

## 2023-10-13 ENCOUNTER — Encounter (HOSPITAL_COMMUNITY): Payer: Self-pay

## 2023-10-13 ENCOUNTER — Emergency Department (HOSPITAL_COMMUNITY)
Admission: EM | Admit: 2023-10-13 | Discharge: 2023-10-13 | Disposition: A | Discharge status: Against Medical Advice | Attending: Student | Admitting: Student

## 2023-10-13 ENCOUNTER — Other Ambulatory Visit: Payer: Self-pay

## 2023-10-13 DIAGNOSIS — K625 Hemorrhage of anus and rectum: Secondary | ICD-10-CM | POA: Diagnosis present

## 2023-10-13 DIAGNOSIS — Z5321 Procedure and treatment not carried out due to patient leaving prior to being seen by health care provider: Secondary | ICD-10-CM | POA: Diagnosis not present

## 2023-10-13 LAB — COMPREHENSIVE METABOLIC PANEL WITH GFR
ALT: 41 U/L (ref 0–44)
AST: 35 U/L (ref 15–41)
Albumin: 3.8 g/dL (ref 3.5–5.0)
Alkaline Phosphatase: 48 U/L (ref 38–126)
Anion gap: 9 (ref 5–15)
BUN: 16 mg/dL (ref 8–23)
CO2: 21 mmol/L — ABNORMAL LOW (ref 22–32)
Calcium: 9.2 mg/dL (ref 8.9–10.3)
Chloride: 106 mmol/L (ref 98–111)
Creatinine, Ser: 0.86 mg/dL (ref 0.61–1.24)
GFR, Estimated: >60 mL/min (ref >60)
Glucose, Bld: 239 mg/dL — ABNORMAL HIGH (ref 70–99)
Potassium: 3.8 mmol/L (ref 3.5–5.1)
Sodium: 136 mmol/L (ref 135–145)
Total Bilirubin: 0.6 mg/dL (ref 0.0–1.2)
Total Protein: 6.7 g/dL (ref 6.5–8.1)

## 2023-10-13 LAB — CBC
HCT: 44.9 % (ref 39.0–52.0)
Hemoglobin: 14.7 g/dL (ref 13.0–17.0)
MCH: 29.9 pg (ref 26.0–34.0)
MCHC: 32.7 g/dL (ref 30.0–36.0)
MCV: 91.4 fL (ref 80.0–100.0)
Platelets: 168 K/uL (ref 150–400)
RBC: 4.91 MIL/uL (ref 4.22–5.81)
RDW: 12.1 % (ref 11.5–15.5)
WBC: 6 K/uL (ref 4.0–10.5)
nRBC: 0 % (ref 0.0–0.2)

## 2023-10-13 LAB — TYPE AND SCREEN
ABO/RH(D): A POS
Antibody Screen: NEGATIVE

## 2023-10-13 NOTE — ED Triage Notes (Signed)
 Patient c/o dark red blood in his stools, now just spots of blood in the stool x 4 days. Patient denies abdominal pain, no constipation, endorses hx of hemorrhoids, patient is concerned it may be all the hot sauce he uses.

## 2023-10-13 NOTE — ED Notes (Signed)
 Patient states that he has been here for 8hrs and he is leaving. Ambulated to the exit with steady gait and no distress noted.

## 2023-10-13 NOTE — ED Provider Triage Note (Signed)
 Emergency Medicine Provider Triage Evaluation Note  Cristian Welch , a 63 y.o. male  was evaluated in triage.  Pt complains of 1 episode of black, loose stool 4 days ago and have since had intermittent bright red blood in stool, not happening with every bowel movement.   Denies alcohol use, drug use. Does endorse NSAID use every 2-3 weeks for intermittent headaches.   Denies fevers, headaches, vision changes, chest pain, shortness of breath, abdominal pain, n/v, dysuria, LE swelling.    Review of Systems  Positive: N/a Negative: N/a  Physical Exam  BP 122/89 (BP Location: Right Arm)   Pulse 100   Temp 98.2 F (36.8 C)   Resp 17   Ht 6' (1.829 m)   Wt 97.5 kg   SpO2 97%   BMI 29.16 kg/m  Gen:   Awake, no distress   Resp:  Normal effort  MSK:   Moves extremities without difficulty  Other:    Medical Decision Making  Medically screening exam initiated at 3:25 PM.  Appropriate orders placed.  Cristian Welch was informed that the remainder of the evaluation will be completed by another provider, this initial triage assessment does not replace that evaluation, and the importance of remaining in the ED until their evaluation is complete.     Cristian Welch, [place] 10/13/23 1530

## 2023-10-14 ENCOUNTER — Emergency Department (HOSPITAL_COMMUNITY)
Admission: EM | Admit: 2023-10-14 | Discharge: 2023-10-14 | Disposition: A | Discharge status: Home / Self Care | Attending: Emergency Medicine | Admitting: Emergency Medicine

## 2023-10-14 ENCOUNTER — Encounter: Payer: Self-pay | Admitting: Physician Assistant

## 2023-10-14 ENCOUNTER — Encounter (HOSPITAL_COMMUNITY): Payer: Self-pay

## 2023-10-14 ENCOUNTER — Other Ambulatory Visit: Payer: Self-pay

## 2023-10-14 DIAGNOSIS — K625 Hemorrhage of anus and rectum: Secondary | ICD-10-CM | POA: Insufficient documentation

## 2023-10-14 LAB — CBC WITH DIFFERENTIAL/PLATELET
Abs Immature Granulocytes: 0.02 K/uL (ref 0.00–0.07)
Basophils Absolute: 0 K/uL (ref 0.0–0.1)
Basophils Relative: 0 %
Eosinophils Absolute: 0.1 K/uL (ref 0.0–0.5)
Eosinophils Relative: 2 %
HCT: 46.7 % (ref 39.0–52.0)
Hemoglobin: 14.6 g/dL (ref 13.0–17.0)
Immature Granulocytes: 0 %
Lymphocytes Relative: 36 %
Lymphs Abs: 1.9 K/uL (ref 0.7–4.0)
MCH: 29 pg (ref 26.0–34.0)
MCHC: 31.3 g/dL (ref 30.0–36.0)
MCV: 92.8 fL (ref 80.0–100.0)
Monocytes Absolute: 0.5 K/uL (ref 0.1–1.0)
Monocytes Relative: 9 %
Neutro Abs: 2.7 K/uL (ref 1.7–7.7)
Neutrophils Relative %: 53 %
Platelets: 166 K/uL (ref 150–400)
RBC: 5.03 MIL/uL (ref 4.22–5.81)
RDW: 12.5 % (ref 11.5–15.5)
WBC: 5.2 K/uL (ref 4.0–10.5)
nRBC: 0 % (ref 0.0–0.2)

## 2023-10-14 LAB — BASIC METABOLIC PANEL WITH GFR
Anion gap: 9 (ref 5–15)
BUN: 23 mg/dL (ref 8–23)
CO2: 21 mmol/L — ABNORMAL LOW (ref 22–32)
Calcium: 9 mg/dL (ref 8.9–10.3)
Chloride: 105 mmol/L (ref 98–111)
Creatinine, Ser: 0.88 mg/dL (ref 0.61–1.24)
GFR, Estimated: >60 mL/min (ref >60)
Glucose, Bld: 226 mg/dL — ABNORMAL HIGH (ref 70–99)
Potassium: 3.9 mmol/L (ref 3.5–5.1)
Sodium: 135 mmol/L (ref 135–145)

## 2023-10-14 NOTE — ED Provider Notes (Signed)
 [place] EMERGENCY DEPARTMENT AT [facility] Provider Note   CSN: [id number] Arrival date & time: 10/14/23  1045     Patient presents with: Rectal Bleeding   Cristian Welch is a 63 y.o. male.   63 year old with prior medical history as detailed below presents for evaluation.  Patient reports intermittent blood in his stool.  This has been an ongoing issue over the last several weeks per his report.  He does not take blood thinners.  He reports prior history of hemorrhoids.  He reports that occasionally he will notice blood with BM.  He reports formed stool with surrounding bright red blood.  He denies pain.  He reports that his bleeding appears to be worse when he strains.  He reports a distant history of colon polyps.  These were removed with colonoscopy approximately 10 years ago per his report.  He has previously seen [facility] per his report.  Also, of note, patient presented to [facility] ED yesterday with same complaint.  Patient obtained labs while being screened in triage.  He did not wait for evaluation given a lengthy wait time.  The history is provided by the patient and medical records.       Prior to Admission medications   Medication Sig Start Date End Date Taking? Authorizing Provider  ACCU-CHEK GUIDE test strip  11/07/19   [provider]  acetaminophen  (TYLENOL ) 500 MG tablet Take 2 tablets (1,000 mg total) by mouth every 6 (six) hours as needed. 02/10/18   McDonald, Mia A, PA-C  albuterol  (PROVENTIL  HFA;VENTOLIN  HFA) 108 (90 BASE) MCG/ACT inhaler Inhale 2 puffs into the lungs every 6 (six) hours as needed for wheezing.    [provider]  amitriptyline (ELAVIL) 25 MG tablet Take 25 mg by mouth at bedtime as needed. 10/07/22   [provider]  atorvastatin (LIPITOR) 10 MG tablet Take 10 mg by mouth at bedtime.    [provider]  AZOPT 1 % ophthalmic suspension INSTILL 1 DROP INTO AFFECTED EYE(S) THREE TIMES DAILY 10/21/17    [provider]  celecoxib  (CELEBREX ) 200 MG capsule Take 1 capsule (200 mg total) by mouth 2 (two) times daily. 11/06/22   Harris, Abigail, PA-C  cetirizine (ZYRTEC) 10 MG tablet Take 10 mg by mouth daily.    [provider]  cyclobenzaprine  (FLEXERIL ) 10 MG tablet Take 1 tablet (10 mg total) by mouth 2 (two) times daily as needed for muscle spasms. 02/10/18   McDonald, Mia A, PA-C  dapagliflozin propanediol (FARXIGA) 10 MG TABS tablet Take 10 mg by mouth daily.    [provider]  DORZOLAMIDE  HCL OP Apply 1 drop to eye 3 (three) times daily.    [provider]  fluticasone  (FLONASE ) 50 MCG/ACT nasal spray Place 1 spray into both nostrils daily. 06/12/17   Nivia Colon, PA-C  glimepiride (AMARYL) 4 MG tablet Take 4 mg by mouth 2 (two) times daily. 04/02/18   [provider]  guaiFENesin  (MUCINEX ) 600 MG 12 hr tablet Take 1 tablet (600 mg total) by mouth 2 (two) times daily. 05/19/20   Couture, Cortni S, PA-C  HYDROcodone -acetaminophen  (NORCO) 5-325 MG tablet Take 1 tablet by mouth every 6 (six) hours as needed for moderate pain or severe pain. 11/06/22   Harris, Abigail, PA-C  latanoprost (XALATAN) 0.005 % ophthalmic solution 1 drop at bedtime. 08/19/22   [provider]  LUMIGAN 0.01 % SOLN INSTILL 1 DROP INTO EACH EYE IN THE EVENING SEPARATE BY AT LEAST  10 MINUTES FROM OTHER INTRAOCULAR PRESSURE REDUCING OPHTHALMIC DRUGS 07/24/18   [provider]  metFORMIN  (GLUCOPHAGE ) 1000 MG tablet Take 1,000 mg by mouth 2 (two) times daily. 10/26/17   [provider]  methocarbamol  (ROBAXIN ) 500 MG tablet Take 1 tablet (500 mg total) by mouth every 8 (eight) hours as needed for muscle spasms. 01/23/18   Theadore Ozell HERO, MD  oxyCODONE -acetaminophen  (PERCOCET) 5-325 MG tablet Take 1 tablet by mouth every 6 (six) hours as needed for severe pain. 08/18/18   Janit Thresa HERO, DPM  OZEMPIC, 1 MG/DOSE, 4 MG/3ML SOPN Inject 1 mg into the skin once a week.  10/23/22   [provider]  propranolol (INDERAL) 10 MG tablet SMARTSIG:1 Tablet(s) By Mouth Every Evening 09/01/19   [provider]  sildenafil (VIAGRA) 100 MG tablet Take 100 mg by mouth daily as needed. 08/08/22   [provider]  sodium chloride  (OCEAN) 0.65 % SOLN nasal spray Place 1 spray into both nostrils as needed for congestion. 05/19/20   Couture, Cortni S, PA-C  tamsulosin (FLOMAX) 0.4 MG CAPS capsule Take 0.4 mg by mouth at bedtime. 07/30/21   [provider]  tiZANidine (ZANAFLEX) 4 MG tablet Take 4 mg by mouth 2 (two) times daily as needed. 02/24/18   [provider]  travoprost , benzalkonium, (TRAVATAN ) 0.004 % ophthalmic solution Place 1 drop into both eyes at bedtime.    [provider]  Vitamin D, Ergocalciferol, (DRISDOL) 1.25 MG (50000 UNIT) CAPS capsule Take 50,000 Units by mouth once a week. 09/14/19   [provider]    Allergies: Cephalexin    Review of Systems  All other systems reviewed and are negative.   Updated Vital Signs BP (!) 145/84   Pulse 92   Temp 97.7 F (36.5 C) (Oral)   Resp 18   Ht 6' (1.829 m)   Wt 97.5 kg   SpO2 99%   BMI 29.15 kg/m   Physical Exam Vitals and nursing note reviewed.  Constitutional:      General: He is not in acute distress.    Appearance: Normal appearance. He is well-developed.  HENT:     Head: Normocephalic and atraumatic.  Eyes:     Conjunctiva/sclera: Conjunctivae normal.     Pupils: Pupils are equal, round, and reactive to light.  Cardiovascular:     Rate and Rhythm: Normal rate and regular rhythm.     Heart sounds: Normal heart sounds.  Pulmonary:     Effort: Pulmonary effort is normal. No respiratory distress.     Breath sounds: Normal breath sounds.  Abdominal:     General: There is no distension.     Palpations: Abdomen is soft.     Tenderness: There is no abdominal tenderness.  Genitourinary:    Comments: Declines GU exam Musculoskeletal:         General: No deformity. Normal range of motion.     Cervical back: Normal range of motion and neck supple.  Skin:    General: Skin is warm and dry.  Neurological:     General: No focal deficit present.     Mental Status: He is alert and oriented to person, place, and time.     (all labs ordered are listed, but only abnormal results are displayed) Labs Reviewed  CBC WITH DIFFERENTIAL/PLATELET  BASIC METABOLIC PANEL WITH GFR    EKG: None  Radiology: No results found.   Procedures   Medications Ordered in the ED - No data to display  Medical Decision Making Patient with reported intermittent bright red blood per rectum times weeks.  Patient without hemodynamic compromise.  Hemoglobin obtained today is stable.  Patient had labs performed yesterday at [facility].  He does not have evidence of significant blood loss on evaluation here or on recent labs.  Patient offered additional observation and/or workup in the ED.  He declined same.  He plans on following up closely with Dr. Albertus who performed his colonoscopy in 2021.  Importance of close follow-up stressed.  Strict return precautions given understood.  Amount and/or Complexity of Data Reviewed Labs: ordered.        Final diagnoses:  Rectal bleeding    ED Discharge Orders     None          Laurice Maude BROCKS, MD 10/14/23 1321

## 2023-10-14 NOTE — ED Triage Notes (Signed)
 Reports blood in stool a few times over the last week, no blood thinners. Hx of hemorrhoids, states he has been straining when having bowel movements. Pt states he thinks it may be related to his diet and spicy foods.

## 2023-10-14 NOTE — ED Provider Triage Note (Signed)
 Emergency Medicine Provider Triage Evaluation Note  Cristian Welch , a 63 y.o. male  was evaluated in triage.  Pt complains of blood in stool.  Pt had last yesterday  Review of Systems  Positive: Constipation, straining Negative: Abdominal pain   Physical Exam  BP (!) 145/84   Pulse 92   Temp 97.7 F (36.5 C) (Oral)   Resp 18   Ht 6' (1.829 m)   Wt 97.5 kg   SpO2 99%   BMI 29.15 kg/m  Gen:   Awake, no distress   Resp:  Normal effort  MSK:   Moves extremities without difficulty  Other:    Medical Decision Making  Medically screening exam initiated at 11:02 AM.  Appropriate orders placed.  Cristian Welch was informed that the remainder of the evaluation will be completed by another provider, this initial triage assessment does not replace that evaluation, and the importance of remaining in the ED until their evaluation is complete.     Cristian Sonny POUR, PA-C 10/14/23 1103

## 2023-10-14 NOTE — Discharge Instructions (Signed)
 Return for any problem.  Your colonoscopy was performed by Dr. Albertus with [facility] in 2021.  You should contact their office for repeat evaluation given your reported bleeding.  If you notice increased bleeding, weakness, fatigue, fever, of abdominal pain you should return to the ED for evaluation.

## 2023-11-20 ENCOUNTER — Ambulatory Visit: Admitting: Physician Assistant

## 2023-11-20 ENCOUNTER — Ambulatory Visit: Admitting: Podiatry

## 2023-11-20 ENCOUNTER — Encounter: Payer: Self-pay | Admitting: Physician Assistant

## 2023-11-20 ENCOUNTER — Encounter: Payer: Self-pay | Admitting: Podiatry

## 2023-11-20 VITALS — BP 102/70 | Ht 72.0 in | Wt 208.0 lb

## 2023-11-20 DIAGNOSIS — M79675 Pain in left toe(s): Secondary | ICD-10-CM

## 2023-11-20 DIAGNOSIS — E119 Type 2 diabetes mellitus without complications: Secondary | ICD-10-CM

## 2023-11-20 DIAGNOSIS — Z860101 Personal history of adenomatous and serrated colon polyps: Secondary | ICD-10-CM | POA: Diagnosis not present

## 2023-11-20 DIAGNOSIS — E1169 Type 2 diabetes mellitus with other specified complication: Secondary | ICD-10-CM

## 2023-11-20 DIAGNOSIS — K625 Hemorrhage of anus and rectum: Secondary | ICD-10-CM

## 2023-11-20 DIAGNOSIS — M79674 Pain in right toe(s): Secondary | ICD-10-CM | POA: Diagnosis not present

## 2023-11-20 DIAGNOSIS — Z8546 Personal history of malignant neoplasm of prostate: Secondary | ICD-10-CM

## 2023-11-20 DIAGNOSIS — Z7984 Long term (current) use of oral hypoglycemic drugs: Secondary | ICD-10-CM | POA: Diagnosis not present

## 2023-11-20 DIAGNOSIS — B351 Tinea unguium: Secondary | ICD-10-CM

## 2023-11-20 MED ORDER — AMBULATORY NON FORMULARY MEDICATION: 1 refills | Status: AC

## 2023-11-20 NOTE — Progress Notes (Signed)
 This patient returns to my office for at risk foot care.  This patient requires this care by a professional since this patient will be at risk due to having type 2 diabetes.  This patient is unable to cut nails himself since the patient cannot reach his nails.These nails are painful walking and wearing shoes.  This patient presents for at risk foot care today.  General Appearance  Alert, conversant and in no acute stress.  Vascular  Dorsalis pedis and posterior tibial  pulses are palpable  bilaterally.  Capillary return is within normal limits  bilaterally. Temperature is within normal limits  bilaterally.  Neurologic  Senn-Weinstein monofilament wire test within normal limits  bilaterally. Muscle power within normal limits bilaterally.  Nails Thick disfigured discolored nails with subungual debris  from hallux to fifth toes bilaterally. No evidence of bacterial infection or drainage bilaterally.  Orthopedic  No limitations of motion  feet .  No crepitus or effusions noted.  No bony pathology or digital deformities noted.  Skin  normotropic skin with no porokeratosis noted bilaterally.  No signs of infections or ulcers noted.   Callus sub 3 left foot asymptomatic.  Onychomycosis  Pain in right toes  Pain in left toes    Consent was obtained for treatment procedures.   Mechanical debridement of nails 1-5  bilaterally performed with a nail nipper.  Filed with dremel without incident. Debride callus with dremel tool.   Return office visit     3   months                Told patient to return for periodic foot care and evaluation due to potential at risk complications.   Helane Gunther DPM

## 2023-11-20 NOTE — Patient Instructions (Addendum)
 _______________________________________________________  If your blood pressure at your visit was 140/90 or greater, please contact your primary care physician to follow up on this.  _______________________________________________________  If you are age 65 or older, your body mass index should be between 23-30. Your Body mass index is 28.21 kg/m. If this is out of the aforementioned range listed, please consider follow up with your Primary Care Provider.  If you are age 92 or younger, your body mass index should be between 19-25. Your Body mass index is 28.21 kg/m. If this is out of the aformentioned range listed, please consider follow up with your Primary Care Provider.   ________________________________________________________  The [facility] providers would like to encourage you to use MYCHART to communicate with providers for non-urgent requests or questions.  Due to long hold times on the telephone, sending your provider a message by [facility] may be a faster and more efficient way to get a response.  Please allow 48 business hours for a response.  Please remember that this is for non-urgent requests.  _______________________________________________________  [facility] is using a team-based approach to care.  Your team is made up of your doctor and two to three APPS. Our APPS (Nurse Practitioners and Physician Assistants) work with your physician to ensure care continuity for you. They are fully qualified to address your health concerns and develop a treatment plan. They communicate directly with your gastroenterologist to care for you. Seeing the Advanced Practice Practitioners on your physician's team can help you by facilitating care more promptly, often allowing for earlier appointments, access to diagnostic testing, procedures, and other specialty referrals.   Miralax is an osmotic laxative.  It only brings more water into the stool.  This is safe to take daily.  Can  take up to 17 gram of miralax twice a day.  Mix with juice or coffee.  Start 1/2-1 capful  for 3-4 days and reassess your response in 3-4 days.  You can increase and decrease the dose based on your response.  Remember, it can take up to 3-4 days to take effect OR for the effects to wear off.   I often pair this with benefiber in the morning to help assure the stool is not too loose.   Toileting tips to help with your constipation - Drink at least 64-80 ounces of water/liquid per day. - Establish a time to try to move your bowels every day.  For many people, this is after a cup of coffee or after a meal such as breakfast. - Sit all of the way back on the toilet keeping your back fairly straight and while sitting up, try to rest the tops of your forearms on your upper thighs.   - Raising your feet with a step stool/squatty potty can be helpful to improve the angle that allows your stool to pass through the rectum. - Relax the rectum feeling it bulge toward the toilet water.  If you feel your rectum raising toward your body, you are contracting rather than relaxing. - Breathe in and slowly exhale. Belly breath by expanding your belly towards your belly button. Keep belly expanded as you gently direct pressure down and back to the anus.  A low pitched GRRR sound can assist with increasing intra-abdominal pressure.  (Can also trying to blow on a pinwheel and make it move, this helps with the same belly breathing) - Repeat 3-4 times. If unsuccessful, contract the pelvic floor to restore normal tone and get off the toilet.  Avoid excessive straining. - To reduce excessive wiping by teaching your anus to normally contract, place hands on outer aspect of knees and resist knee movement outward.  Hold 5-10 second then place hands just inside of knees and resist inward movement of knees.  Hold 5 seconds.  Repeat a few times each way.  Go to the ER if unable to pass gas, severe AB pain, unable to hold down  food, any shortness of breath of chest pain.  Anal Fissure, Adult  A fissure is a linear defect in the anal mucosa, symptoms include burning, itching, discomfort especially with a bowel movement with associated rectal bleeding.  Risk factors include low fiber diet, chronic constipation and straining. Anal fissures can take a very long time to heal so this will be a 2 to 7-month process.  Treatment for a fissure includes:  -decreasing time in the toilet should not be more than 5 minutes -adding fiber supplement such as Benefiber or Citrucel - increase water -I am also going to send in a calcium  channel blocker cream to a compound pharmacy, apply twice daily for 12 weeks.  Diltiazem/lidocaine  3 x daily for 2 months sent to compound pharmacy   Sent this medication to a compound pharmacy:  [facility] [address] [place], [place], [place] [address]  [phone]  Please DO NOT go directly from our office to pick up this medication! Give the pharmacy 1 day to process the prescription. Extra time is required for them to compound your medication.  Thank you for entrusting me with your care and choosing [facility].  Alan Coombs, PA-C

## 2023-11-20 NOTE — Progress Notes (Signed)
 11/20/2023 Cristian Welch [id number] 04-13-1960  Referring provider: Shelda Atlas, MD Primary GI doctor: Dr. Albertus  ASSESSMENT AND PLAN:  Rectal bleeding with history of constipation x July Had more constipation with shoulder surgey several months 2021 colonoscopy TA polyps, diverticulosis otherwise unremarkable recall 2028 10/14/2023 CBC without anemia Tender rectum and posterior fissure, unable to do internal exam, will treat fissure and constipation, if not better will schedule for flex sig versus colonoscopy rule out stricture/stenosis from previous radiation Sitz baths, high-fiber diet, add on benefiber.  Long discussion about preventing constipation discussed in detail for prevention.  Use calmol4 suppositories twice a day for 6 weeks Diltiazem2%/lidocaine  5% 3 x daily for 2 months on rectum #30 gram 1 refill Close follow up 4-6 weeks  Personal history of colon polyps 07/26/2019 colonoscopy Dr. Albertus good bowel prep 3 mm polyp cecum, 5 mm polyp descending colon 4 mm polyp sigmoid colon diverticulosis sigmoid descending colon hepatic flexure.  Path 2 of 3 polyps TA recall 7 years  Type 2 diabetes On metformin , Amaryl Was on ozempic for several months but stopped due to weight loss, stopped about 8 months ago  Prostate cancer  S/p radiation 10 years ago 2014 s/p prostectomy with Dr. Eliza Following with [facility]   Patient Care Team: Shelda Atlas, MD as PCP - General (Internal Medicine)  HISTORY OF PRESENT ILLNESS: 63 y.o. male with a past medical history listed below presents for evaluation of rectal bleeding.   Patient last seen in the office at the [facility] by Dr. Albertus 07/26/2019 for screening colonoscopy.  Discussed the use of AI scribe software for clinical note transcription with the patient, who gave verbal consent to proceed.  History of Present Illness   Cristian Welch is a 63 year old male with diverticulosis and hemorrhoids who presents with rectal  bleeding and constipation.  He has been experiencing rectal bleeding since July 2025, characterized by bright red blood in the toilet. The bleeding is intermittent and associated with straining during bowel movements. He initially did not seek medical attention but became concerned after the bleeding persisted for about a week, leading to an urgent care visit in August 2025. No rectal burning, itching, or pain is reported.  He has a history of diverticulosis and hemorrhoids, diagnosed during a colonoscopy in 2021, during which several polyps were removed, two of which were precancerous. He also has a history of prostate cancer treated with radiation therapy approximately 10-12 years ago, with no recurrence since.  He experiences constipation with alternating bowel habits, sometimes requiring straining. His bowel movements can be inconsistent, sometimes easy and other times difficult. No heartburn, nausea, vomiting, or dark stools.  He has a history of diabetes managed with metformin  and glimepiride. He previously used Ozempic but discontinued it about a year ago due to significant weight loss, from 230-240 pounds to 210 pounds, which he found satisfactory.  He underwent shoulder surgery earlier in 2025 and was prescribed hydrocodone  and Percocet post-operatively, but is not currently taking any pain medications. He mentions using Zanaflex, a muscle relaxer, but denies using Celebrex  or any other anti-inflammatory medications. He takes amitriptyline as needed at bedtime, which was started after his shoulder surgery to replace another medication. He is not taking Farxiga or any injectable diabetes medications.        He  reports that he quit smoking about 21 years ago. His smoking use included cigarettes. He started smoking about 41 years ago. He has a 5 pack-year smoking history.  He has never used smokeless tobacco. He reports that he does not drink alcohol and does not use drugs.  RELEVANT GI  HISTORY, IMAGING AND LABS: Results   DIAGNOSTIC Colonoscopy: Polyps removed, two adenomatous polyps, diverticulosis, hemorrhoids (2021)      CBC    Component Value Date/Time   WBC 5.2 10/14/2023 1140   RBC 5.03 10/14/2023 1140   HGB 14.6 10/14/2023 1140   HCT 46.7 10/14/2023 1140   PLT 166 10/14/2023 1140   MCV 92.8 10/14/2023 1140   MCH 29.0 10/14/2023 1140   MCHC 31.3 10/14/2023 1140   RDW 12.5 10/14/2023 1140   LYMPHSABS 1.9 10/14/2023 1140   MONOABS 0.5 10/14/2023 1140   EOSABS 0.1 10/14/2023 1140   BASOSABS 0.0 10/14/2023 1140   Recent Labs    06/20/23 1821 10/13/23 1505 10/14/23 1140  HGB 14.7 14.7 14.6    CMP     Component Value Date/Time   NA 135 10/14/2023 1140   K 3.9 10/14/2023 1140   CL 105 10/14/2023 1140   CO2 21 (L) 10/14/2023 1140   GLUCOSE 226 (H) 10/14/2023 1140   BUN 23 10/14/2023 1140   CREATININE 0.88 10/14/2023 1140   CREATININE 0.75 03/13/2020 0954   CALCIUM  9.0 10/14/2023 1140   PROT 6.7 10/13/2023 1505   ALBUMIN 3.8 10/13/2023 1505   AST 35 10/13/2023 1505   ALT 41 10/13/2023 1505   ALKPHOS 48 10/13/2023 1505   BILITOT 0.6 10/13/2023 1505   GFRNONAA >60 10/14/2023 1140   GFRNONAA 99 03/13/2020 0954   GFRAA 115 03/13/2020 0954      Latest Ref Rng & Units 10/13/2023    3:05 PM 06/20/2023    6:21 PM 03/13/2020    9:54 AM  Hepatic Function  Total Protein 6.5 - 8.1 g/dL 6.7  6.4  6.9   Albumin 3.5 - 5.0 g/dL 3.8  3.6    AST 15 - 41 U/L 35  30  31   ALT 0 - 44 U/L 41  38  41   Alk Phosphatase 38 - 126 U/L 48  41    Total Bilirubin 0.0 - 1.2 mg/dL 0.6  0.7  0.4       Current Medications:   Current Outpatient Medications (Endocrine & Metabolic):    glimepiride (AMARYL) 4 MG tablet, Take 4 mg by mouth 2 (two) times daily.   metFORMIN  (GLUCOPHAGE ) 1000 MG tablet, Take 1,000 mg by mouth 2 (two) times daily.   dapagliflozin propanediol (FARXIGA) 10 MG TABS tablet, Take 10 mg by mouth daily. (Patient not taking: Reported on  11/20/2023)   OZEMPIC, 1 MG/DOSE, 4 MG/3ML SOPN, Inject 1 mg into the skin once a week. (Patient not taking: Reported on 11/20/2023)  Current Facility-Administered Medications (Endocrine & Metabolic):    methylPREDNISolone  acetate (DEPO-MEDROL ) injection 40 mg  Current Outpatient Medications (Cardiovascular):    atorvastatin (LIPITOR) 10 MG tablet, Take 10 mg by mouth at bedtime.   propranolol (INDERAL) 10 MG tablet, SMARTSIG:1 Tablet(s) By Mouth Every Evening   sildenafil (VIAGRA) 100 MG tablet, Take 100 mg by mouth daily as needed.   Current Outpatient Medications (Respiratory):    albuterol  (PROVENTIL  HFA;VENTOLIN  HFA) 108 (90 BASE) MCG/ACT inhaler, Inhale 2 puffs into the lungs every 6 (six) hours as needed for wheezing.   cetirizine (ZYRTEC) 10 MG tablet, Take 10 mg by mouth daily.   fluticasone  (FLONASE ) 50 MCG/ACT nasal spray, Place 1 spray into both nostrils daily.   guaiFENesin  (MUCINEX ) 600 MG 12 hr tablet, Take  1 tablet (600 mg total) by mouth 2 (two) times daily.   sodium chloride  (OCEAN) 0.65 % SOLN nasal spray, Place 1 spray into both nostrils as needed for congestion.   Current Outpatient Medications (Analgesics):    acetaminophen  (TYLENOL ) 500 MG tablet, Take 2 tablets (1,000 mg total) by mouth every 6 (six) hours as needed.   celecoxib  (CELEBREX ) 200 MG capsule, Take 1 capsule (200 mg total) by mouth 2 (two) times daily. (Patient not taking: Reported on 11/20/2023)   HYDROcodone -acetaminophen  (NORCO) 5-325 MG tablet, Take 1 tablet by mouth every 6 (six) hours as needed for moderate pain or severe pain. (Patient not taking: Reported on 11/20/2023)   oxyCODONE -acetaminophen  (PERCOCET) 5-325 MG tablet, Take 1 tablet by mouth every 6 (six) hours as needed for severe pain. (Patient not taking: Reported on 11/20/2023)     Current Outpatient Medications (Other):    ACCU-CHEK GUIDE test strip,    AMBULATORY NON FORMULARY MEDICATION, Medication Name: Diltiazem 2%/Lidocaine  2% Using  your index finger apply a small amount of medication inside the anal opening and to the external anal area twice daily x 6 weeks.   amitriptyline (ELAVIL) 25 MG tablet, Take 25 mg by mouth at bedtime as needed.   AZOPT 1 % ophthalmic suspension, INSTILL 1 DROP INTO AFFECTED EYE(S) THREE TIMES DAILY   cyclobenzaprine  (FLEXERIL ) 10 MG tablet, Take 1 tablet (10 mg total) by mouth 2 (two) times daily as needed for muscle spasms.   DORZOLAMIDE  HCL OP, Apply 1 drop to eye 3 (three) times daily.   latanoprost (XALATAN) 0.005 % ophthalmic solution, 1 drop at bedtime.   LUMIGAN 0.01 % SOLN, INSTILL 1 DROP INTO EACH EYE IN THE EVENING SEPARATE BY AT LEAST 10 MINUTES FROM OTHER INTRAOCULAR PRESSURE REDUCING OPHTHALMIC DRUGS   methocarbamol  (ROBAXIN ) 500 MG tablet, Take 1 tablet (500 mg total) by mouth every 8 (eight) hours as needed for muscle spasms.   tamsulosin (FLOMAX) 0.4 MG CAPS capsule, Take 0.4 mg by mouth at bedtime.   tiZANidine (ZANAFLEX) 4 MG tablet, Take 4 mg by mouth 2 (two) times daily as needed.   travoprost , benzalkonium, (TRAVATAN ) 0.004 % ophthalmic solution, Place 1 drop into both eyes at bedtime.   Vitamin D, Ergocalciferol, (DRISDOL) 1.25 MG (50000 UNIT) CAPS capsule, Take 50,000 Units by mouth once a week.   Medical History:  Past Medical History:  Diagnosis Date   Allergic rhinitis    Arthritis    At risk for sleep apnea    STOP-BANG= 4    SENT TO PCP 11-28-2013   Exercise-induced asthma    GERD (gastroesophageal reflux disease)    Glaucoma    History of MRSA infection    05/ 2014--  POSITIVE MRSA CULTURE SCROTAL ABSCESS   Prostate cancer [facility])    s/p  prostatectomy 08/ 2014   Skin tag of anus    Type 2 diabetes mellitus (HCC)    Allergies:  Allergies  Allergen Reactions   Cephalexin Rash     Surgical History:  He  has a past surgical history that includes Foot surgery (Left, 1994 (approx)); transthoracic echocardiogram (06/06/2009); Irrigation and debridement  abscess (Right, 07/07/2012); Circumcision (N/A, 08/04/2012); Prostate biopsy (N/A, 08/04/2012); Robot assisted laparoscopic radical prostatectomy (N/A, 10/06/2012); Lymphadenectomy (Bilateral, 10/06/2012); Inguinal hernia repair (Right, 2000 (approx)); Excision of skin tag (N/A, 12/01/2013); and Shoulder arthroscopy (Left). Family History:  His family history is not on file.  REVIEW OF SYSTEMS  : All other systems reviewed and negative except where noted in  the History of Present Illness.  PHYSICAL EXAM: BP 102/70   Ht 6' (1.829 m)   Wt 208 lb (94.3 kg)   BMI 28.21 kg/m  Physical Exam   GENERAL APPEARANCE: Well nourished, in no apparent distress. HEENT: No cervical lymphadenopathy, unremarkable thyroid, sclerae anicteric, conjunctiva pink. RESPIRATORY: Respiratory effort normal, breath sounds equal bilaterally without rales, rhonchi, or wheezing. CARDIO: Regular rate and rhythm with no murmurs, rubs, or gallops, peripheral pulses intact. ABDOMEN: Soft, non-distended, active bowel sounds in all four quadrants, no tenderness to palpation, no rebound, no mass appreciated. RECTAL: Posterior anal fissure observed. Rectal exam difficult due to tightness. Stool brown. MUSCULOSKELETAL: Full range of motion, normal gait, without edema. SKIN: Dry, intact without rashes or lesions. No jaundice. NEURO: Alert, oriented, no focal deficits. PSYCH: Cooperative, normal mood and affect.      Alan JONELLE Coombs, PA-C 3:40 PM

## 2023-12-09 ENCOUNTER — Ambulatory Visit: Admitting: Physician Assistant

## 2023-12-17 NOTE — Progress Notes (Unsigned)
 12/18/2023 Cristian Welch [id number] 1961/02/13  Referring provider: Shelda Atlas, MD Primary GI doctor: Dr. Albertus  ASSESSMENT AND PLAN:  Rectal bleeding with history of constipation x July 2021 colonoscopy TA polyps, diverticulosis recall 2028 10/14/2023 CBC without anemia Posterior fissure found 9/26 treated with diltiazem, Calmol 4 suppositories with continuing blood in the stool, BRB in the stool/water No rectal pain, can have rectal itching He never took miralax, states he has soft stools Declines rectal exam today  -With continuing blood in stool despite treatment and patient is due for colonoscopy in 2 years will proceed with colonoscopy to rule out radiation proctitis, colitis, etc.  We have discussed the risks of bleeding, infection, perforation, medication reactions, and remote risk of death associated with colonoscopy. All questions were answered and the patient acknowledges these risk and wishes to proceed. - Increase fiber/ water intake, decrease caffeine, increase activity level. - continue creams - squatty potty, etc  Personal history of colon polyps 07/26/2019 colonoscopy Dr. Albertus good bowel prep 3 mm polyp cecum, 5 mm polyp descending colon 4 mm polyp sigmoid colon diverticulosis sigmoid descending colon hepatic flexure.  Path 2 of 3 polyps TA recall 7 years Recall 07/2026  Type 2 diabetes On metformin , Amaryl Was on ozempic for several months but stopped due to weight loss, stopped about 8 months ago  Prostate cancer  S/p radiation 10 years ago 2014 s/p prostectomy with Dr. Eliza Following with [facility]   Patient Care Team: Shelda Atlas, MD as PCP - General (Internal Medicine)  HISTORY OF PRESENT ILLNESS: 63 y.o. male with a past medical history listed below presents for evaluation of rectal bleeding.   I last saw the patient 11/20/2023 for for rectal bleeding with constipation found to have posterior fissure treated with  diltiazem.  Discussed the use of AI scribe software for clinical note transcription with the patient, who gave verbal consent to proceed.  History of Present Illness   Cristian Welch is a 63 year old male with a history of prostate cancer treated with radiation therapy who presents with rectal bleeding.  He experiences intermittent rectal bleeding characterized by bright red blood in the stool, not on the tissue. Despite using diltiazem suppositories daily for a posterior anal fissure, he continues to notice blood in his stool.  He has regular, soft bowel movements without the need for straining. He has tried Miralax twice but found it unnecessary. He occasionally experiences an itch but denies rectal burning, pain, weight loss, abdominal pain, nausea, or vomiting.  His past medical history includes prostate cancer treated with radiation therapy approximately ten years ago. He is not on any blood thinners and no longer takes Ozempic. His current medications include metformin  and Amaryl, and he occasionally uses a muscle relaxant. His last colonoscopy was in 2021, with the next one due in 2028.      He  reports that he quit smoking about 21 years ago. His smoking use included cigarettes. He started smoking about 41 years ago. He has a 5 pack-year smoking history. He has never used smokeless tobacco. He reports that he does not drink alcohol and does not use drugs.  RELEVANT GI HISTORY, IMAGING AND LABS: Results          CBC    Component Value Date/Time   WBC 5.2 10/14/2023 1140   RBC 5.03 10/14/2023 1140   HGB 14.6 10/14/2023 1140   HCT 46.7 10/14/2023 1140   PLT 166 10/14/2023 1140   MCV 92.8  10/14/2023 1140   MCH 29.0 10/14/2023 1140   MCHC 31.3 10/14/2023 1140   RDW 12.5 10/14/2023 1140   LYMPHSABS 1.9 10/14/2023 1140   MONOABS 0.5 10/14/2023 1140   EOSABS 0.1 10/14/2023 1140   BASOSABS 0.0 10/14/2023 1140   Recent Labs    06/20/23 1821 10/13/23 1505 10/14/23 1140  HGB  14.7 14.7 14.6    CMP     Component Value Date/Time   NA 135 10/14/2023 1140   K 3.9 10/14/2023 1140   CL 105 10/14/2023 1140   CO2 21 (L) 10/14/2023 1140   GLUCOSE 226 (H) 10/14/2023 1140   BUN 23 10/14/2023 1140   CREATININE 0.88 10/14/2023 1140   CREATININE 0.75 03/13/2020 0954   CALCIUM  9.0 10/14/2023 1140   PROT 6.7 10/13/2023 1505   ALBUMIN 3.8 10/13/2023 1505   AST 35 10/13/2023 1505   ALT 41 10/13/2023 1505   ALKPHOS 48 10/13/2023 1505   BILITOT 0.6 10/13/2023 1505   GFRNONAA >60 10/14/2023 1140   GFRNONAA 99 03/13/2020 0954   GFRAA 115 03/13/2020 0954      Latest Ref Rng & Units 10/13/2023    3:05 PM 06/20/2023    6:21 PM 03/13/2020    9:54 AM  Hepatic Function  Total Protein 6.5 - 8.1 g/dL 6.7  6.4  6.9   Albumin 3.5 - 5.0 g/dL 3.8  3.6    AST 15 - 41 U/L 35  30  31   ALT 0 - 44 U/L 41  38  41   Alk Phosphatase 38 - 126 U/L 48  41    Total Bilirubin 0.0 - 1.2 mg/dL 0.6  0.7  0.4       Current Medications:   Current Outpatient Medications (Endocrine & Metabolic):    metFORMIN  (GLUCOPHAGE ) 1000 MG tablet, Take 1,000 mg by mouth 2 (two) times daily.   dapagliflozin propanediol (FARXIGA) 10 MG TABS tablet, Take 10 mg by mouth daily. (Patient not taking: Reported on 12/18/2023)   glimepiride (AMARYL) 4 MG tablet, Take 4 mg by mouth 2 (two) times daily. (Patient not taking: Reported on 12/18/2023)   OZEMPIC, 1 MG/DOSE, 4 MG/3ML SOPN, Inject 1 mg into the skin once a week. (Patient not taking: Reported on 12/18/2023)  Current Facility-Administered Medications (Endocrine & Metabolic):    methylPREDNISolone  acetate (DEPO-MEDROL ) injection 40 mg  Current Outpatient Medications (Cardiovascular):    atorvastatin (LIPITOR) 10 MG tablet, Take 10 mg by mouth at bedtime.   propranolol (INDERAL) 10 MG tablet, SMARTSIG:1 Tablet(s) By Mouth Every Evening   sildenafil (VIAGRA) 100 MG tablet, Take 100 mg by mouth daily as needed.   Current Outpatient Medications  (Respiratory):    albuterol  (PROVENTIL  HFA;VENTOLIN  HFA) 108 (90 BASE) MCG/ACT inhaler, Inhale 2 puffs into the lungs every 6 (six) hours as needed for wheezing.   cetirizine (ZYRTEC) 10 MG tablet, Take 10 mg by mouth daily.   guaiFENesin  (MUCINEX ) 600 MG 12 hr tablet, Take 1 tablet (600 mg total) by mouth 2 (two) times daily.   sodium chloride  (OCEAN) 0.65 % SOLN nasal spray, Place 1 spray into both nostrils as needed for congestion.   fluticasone  (FLONASE ) 50 MCG/ACT nasal spray, Place 1 spray into both nostrils daily. (Patient not taking: Reported on 12/18/2023)   Current Outpatient Medications (Analgesics):    acetaminophen  (TYLENOL ) 500 MG tablet, Take 2 tablets (1,000 mg total) by mouth every 6 (six) hours as needed.   celecoxib  (CELEBREX ) 200 MG capsule, Take 1 capsule (200 mg total) by  mouth 2 (two) times daily.   HYDROcodone -acetaminophen  (NORCO) 5-325 MG tablet, Take 1 tablet by mouth every 6 (six) hours as needed for moderate pain or severe pain. (Patient not taking: Reported on 12/18/2023)   oxyCODONE -acetaminophen  (PERCOCET) 5-325 MG tablet, Take 1 tablet by mouth every 6 (six) hours as needed for severe pain. (Patient not taking: Reported on 12/18/2023)     Current Outpatient Medications (Other):    ACCU-CHEK GUIDE test strip,    AMBULATORY NON FORMULARY MEDICATION, Medication Name: Diltiazem 2%/Lidocaine  2% Using your index finger apply a small amount of medication inside the anal opening and to the external anal area twice daily x 6 weeks.   amitriptyline (ELAVIL) 25 MG tablet, Take 25 mg by mouth at bedtime as needed.   AZOPT 1 % ophthalmic suspension, INSTILL 1 DROP INTO AFFECTED EYE(S) THREE TIMES DAILY   cyclobenzaprine  (FLEXERIL ) 10 MG tablet, Take 1 tablet (10 mg total) by mouth 2 (two) times daily as needed for muscle spasms.   DORZOLAMIDE  HCL OP, Apply 1 drop to eye 3 (three) times daily.   latanoprost (XALATAN) 0.005 % ophthalmic solution, 1 drop at bedtime.    LUMIGAN 0.01 % SOLN, INSTILL 1 DROP INTO EACH EYE IN THE EVENING SEPARATE BY AT LEAST 10 MINUTES FROM OTHER INTRAOCULAR PRESSURE REDUCING OPHTHALMIC DRUGS   methocarbamol  (ROBAXIN ) 500 MG tablet, Take 1 tablet (500 mg total) by mouth every 8 (eight) hours as needed for muscle spasms.   tamsulosin (FLOMAX) 0.4 MG CAPS capsule, Take 0.4 mg by mouth at bedtime.   tiZANidine (ZANAFLEX) 4 MG tablet, Take 4 mg by mouth 2 (two) times daily as needed.   travoprost , benzalkonium, (TRAVATAN ) 0.004 % ophthalmic solution, Place 1 drop into both eyes at bedtime.   Vitamin D, Ergocalciferol, (DRISDOL) 1.25 MG (50000 UNIT) CAPS capsule, Take 50,000 Units by mouth once a week.   Medical History:  Past Medical History:  Diagnosis Date   Allergic rhinitis    Arthritis    At risk for sleep apnea    STOP-BANG= 4    SENT TO PCP 11-28-2013   Exercise-induced asthma    GERD (gastroesophageal reflux disease)    Glaucoma    History of MRSA infection    05/ 2014--  POSITIVE MRSA CULTURE SCROTAL ABSCESS   Prostate cancer [facility])    s/p  prostatectomy 08/ 2014   Skin tag of anus    Type 2 diabetes mellitus (HCC)    Allergies:  Allergies  Allergen Reactions   Cephalexin Rash     Surgical History:  He  has a past surgical history that includes Foot surgery (Left, 1994 (approx)); transthoracic echocardiogram (06/06/2009); Irrigation and debridement abscess (Right, 07/07/2012); Circumcision (N/A, 08/04/2012); Prostate biopsy (N/A, 08/04/2012); Robot assisted laparoscopic radical prostatectomy (N/A, 10/06/2012); Lymphadenectomy (Bilateral, 10/06/2012); Inguinal hernia repair (Right, 2000 (approx)); Excision of skin tag (N/A, 12/01/2013); and Shoulder arthroscopy (Left). Family History:  His family history is not on file.  REVIEW OF SYSTEMS  : All other systems reviewed and negative except where noted in the History of Present Illness.  PHYSICAL EXAM: BP 116/78 (BP Location: Left Arm, Patient Position: Sitting,  Cuff Size: Normal)   Pulse 92   Ht 6' (1.829 m)   Wt 207 lb 4 oz (94 kg)   SpO2 96%   BMI 28.11 kg/m  Physical Exam   GENERAL APPEARANCE: Well nourished, in no apparent distress HEENT: No cervical lymphadenopathy, unremarkable thyroid, sclerae anicteric, conjunctiva pink RESPIRATORY: Respiratory effort normal, BS equal bilateral without  rales, rhonchi, wheezing CARDIO: RRR with no MRGs, peripheral pulses intact ABDOMEN: Soft, non distended, active bowel sounds in all 4 quadrants, no tenderness to palpation, no rebound, no mass appreciated RECTAL: declines MUSCULOSKELETAL: Full ROM, normal gait, without edema SKIN: Dry, intact without rashes or lesions. No jaundice. NEURO: Alert, oriented, no focal deficits PSYCH: Cooperative, normal mood and affect.      Alan JONELLE Coombs, PA-C 1:43 PM

## 2023-12-18 ENCOUNTER — Encounter: Payer: Self-pay | Admitting: Physician Assistant

## 2023-12-18 ENCOUNTER — Ambulatory Visit: Admitting: Physician Assistant

## 2023-12-18 ENCOUNTER — Ambulatory Visit: Payer: Self-pay | Admitting: Physician Assistant

## 2023-12-18 ENCOUNTER — Other Ambulatory Visit (INDEPENDENT_AMBULATORY_CARE_PROVIDER_SITE_OTHER)

## 2023-12-18 VITALS — BP 116/78 | HR 92 | Ht 72.0 in | Wt 207.2 lb

## 2023-12-18 DIAGNOSIS — Z8546 Personal history of malignant neoplasm of prostate: Secondary | ICD-10-CM | POA: Diagnosis not present

## 2023-12-18 DIAGNOSIS — E1169 Type 2 diabetes mellitus with other specified complication: Secondary | ICD-10-CM

## 2023-12-18 DIAGNOSIS — K625 Hemorrhage of anus and rectum: Secondary | ICD-10-CM

## 2023-12-18 DIAGNOSIS — Z860101 Personal history of adenomatous and serrated colon polyps: Secondary | ICD-10-CM

## 2023-12-18 DIAGNOSIS — Z8719 Personal history of other diseases of the digestive system: Secondary | ICD-10-CM | POA: Diagnosis not present

## 2023-12-18 LAB — CBC WITH DIFFERENTIAL/PLATELET
Basophils Absolute: 0 K/uL (ref 0.0–0.1)
Basophils Relative: 0.4 % (ref 0.0–3.0)
Eosinophils Absolute: 0.1 K/uL (ref 0.0–0.7)
Eosinophils Relative: 2.3 % (ref 0.0–5.0)
HCT: 45.2 % (ref 39.0–52.0)
Hemoglobin: 15 g/dL (ref 13.0–17.0)
Lymphocytes Relative: 34.9 % (ref 12.0–46.0)
Lymphs Abs: 2 K/uL (ref 0.7–4.0)
MCHC: 33.3 g/dL (ref 30.0–36.0)
MCV: 90.3 fl (ref 78.0–100.0)
Monocytes Absolute: 0.5 K/uL (ref 0.1–1.0)
Monocytes Relative: 8.3 % (ref 3.0–12.0)
Neutro Abs: 3.1 K/uL (ref 1.4–7.7)
Neutrophils Relative %: 54.1 % (ref 43.0–77.0)
Platelets: 162 K/uL (ref 150.0–400.0)
RBC: 5 Mil/uL (ref 4.22–5.81)
RDW: 13.8 % (ref 11.5–15.5)
WBC: 5.8 K/uL (ref 4.0–10.5)

## 2023-12-18 MED ORDER — NA SULFATE-K SULFATE-MG SULF 17.5-3.13-1.6 GM/177ML PO SOLN: 1.0000 | ORAL | 0 refills | Status: DC

## 2023-12-18 NOTE — Patient Instructions (Addendum)
  Your provider has requested that you go to the basement level for lab work before leaving today. Press B on the elevator. The lab is located at the first door on the left as you exit the elevator.  We have sent the following medications to your pharmacy for you to pick up at your convenience: Suprep   Recommend starting on a fiber supplement, can try metamucil first but if this causes gas/bloating switch to benefiber or citracel, these do not cause gas.  Take with fiber with with a full 8 oz glass of water once a day. This can take 1 month to start helping, so try for at least one month.  Recommend increasing water and physical activity.   - Drink at least 64-80 ounces of water/liquid per day. - Establish a time to try to move your bowels every day.  For many people, this is after a cup of coffee or after a meal such as breakfast. - Sit all of the way back on the toilet keeping your back fairly straight and while sitting up, try to rest the tops of your forearms on your upper thighs.   - Raising your feet with a step stool/squatty potty can be helpful to improve the angle that allows your stool to pass through the rectum. - Relax the rectum feeling it bulge toward the toilet water.  If you feel your rectum raising toward your body, you are contracting rather than relaxing. - Breathe in and slowly exhale. Belly breath by expanding your belly towards your belly button. Keep belly expanded as you gently direct pressure down and back to the anus.  A low pitched GRRR sound can assist with increasing intra-abdominal pressure.  (Can also trying to blow on a pinwheel and make it move, this helps with the same belly breathing) - Repeat 3-4 times. If unsuccessful, contract the pelvic floor to restore normal tone and get off the toilet.  Avoid excessive straining. - To reduce excessive wiping by teaching your anus to normally contract, place hands on outer aspect of knees and resist knee movement  outward.  Hold 5-10 second then place hands just inside of knees and resist inward movement of knees.  Hold 5 seconds.  Repeat a few times each way.  Go to the ER if unable to pass gas, severe AB pain, unable to hold down food, any shortness of breath of chest pain.  You have been scheduled for a colonoscopy. Please follow written instructions given to you at your visit today.   If you use inhalers (even only as needed), please bring them with you on the day of your procedure.  DO NOT TAKE 7 DAYS PRIOR TO TEST- Trulicity (dulaglutide) Ozempic, Wegovy (semaglutide) Mounjaro (tirzepatide) Bydureon Bcise (exanatide extended release)  DO NOT TAKE 1 DAY PRIOR TO YOUR TEST Rybelsus (semaglutide) Adlyxin (lixisenatide) Victoza (liraglutide) Byetta (exanatide) ___________________________________________________________________________   Thank you for choosing me and [facility].  Alan Coombs, PA_C

## 2024-01-25 ENCOUNTER — Encounter: Payer: Self-pay | Admitting: Internal Medicine

## 2024-01-25 ENCOUNTER — Ambulatory Visit: Admitting: Internal Medicine

## 2024-01-25 VITALS — BP 107/85 | HR 81 | Temp 97.2°F | Resp 14 | Ht 72.0 in | Wt 207.0 lb

## 2024-01-25 DIAGNOSIS — Z860101 Personal history of adenomatous and serrated colon polyps: Secondary | ICD-10-CM

## 2024-01-25 DIAGNOSIS — K5730 Diverticulosis of large intestine without perforation or abscess without bleeding: Secondary | ICD-10-CM

## 2024-01-25 DIAGNOSIS — K625 Hemorrhage of anus and rectum: Secondary | ICD-10-CM

## 2024-01-25 DIAGNOSIS — K602 Anal fissure, unspecified: Secondary | ICD-10-CM | POA: Diagnosis not present

## 2024-01-25 DIAGNOSIS — K627 Radiation proctitis: Secondary | ICD-10-CM | POA: Diagnosis not present

## 2024-01-25 MED ORDER — SODIUM CHLORIDE 0.9 % IV SOLN: 500.0000 mL | Freq: Once | INTRAVENOUS | Status: DC

## 2024-01-25 NOTE — Op Note (Signed)
 [facility] Patient Name: Cristian Welch Procedure Date: 01/25/2024 2:51 PM MRN: [id number] Endoscopist: Gordy CHRISTELLA Starch , MD, [id number] Age: 63 Referring MD:  Date of Birth: 1960/10/07 Gender: Male Account #: [id number] Procedure:                Colonoscopy Indications:              Rectal bleeding, hx of polyps at last colonoscopy                            June 2021 (TA x 2) Medicines:                Monitored Anesthesia Care Procedure:                Pre-Anesthesia Assessment:                           - Prior to the procedure, a History and Physical                            was performed, and patient medications and                            allergies were reviewed. The patient's tolerance of                            previous anesthesia was also reviewed. The risks                            and benefits of the procedure and the sedation                            options and risks were discussed with the patient.                            All questions were answered, and informed consent                            was obtained. Prior Anticoagulants: The patient has                            taken no anticoagulant or antiplatelet agents. ASA                            Grade Assessment: II - A patient with mild systemic                            disease. After reviewing the risks and benefits,                            the patient was deemed in satisfactory condition to                            undergo the procedure.  After obtaining informed consent, the colonoscope                            was passed under direct vision. Throughout the                            procedure, the patient's blood pressure, pulse, and                            oxygen saturations were monitored continuously. The                            Olympus Scope SN [id number] was introduced through the                            anus and advanced to the cecum,  identified by                            appendiceal orifice and ileocecal valve. The                            colonoscopy was performed without difficulty. The                            patient tolerated the procedure well. The quality                            of the bowel preparation was fair. The ileocecal                            valve, appendiceal orifice, and rectum were                            photographed. Scope In: 2:59:47 PM Scope Out: 3:13:02 PM Scope Withdrawal Time: 0 hours 11 minutes 43 seconds  Total Procedure Duration: 0 hours 13 minutes 15 seconds  Findings:                 A posterior anal fissure was found on perianal exam.                           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                           Multiple medium-mouthed and small-mouthed                            diverticula were found in the sigmoid colon,                            descending colon and hepatic flexure.  No additional abnormalities were found on                            retroflexion. Complications:            No immediate complications. Estimated Blood Loss:     Estimated blood loss: none. Impression:               - Preparation of the colon was fair. Patient                            reported eating yesterday.                           - Anal fissure found on perianal exam.                           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 Most likely source of intermittent rectal                            bleeding.                           - Moderate diverticulosis in the sigmoid colon, in                            the descending colon and at the hepatic flexure.                           - No specimens collected. Recommendation:           - Patient has a contact number available for                            emergencies.  The signs and symptoms of potential                            delayed complications were discussed with the                            patient. Return to normal activities tomorrow.                            Written discharge instructions were provided to the                            patient.                           - Resume previous diet. Add Metamucil 2 tablespoons                            daily to help with BMs and to prevent fissure.                           -  Continue present medications.                           - Repeat colonoscopy for surveillance as previously                            recommended in June 2028 given fair prep today.                           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 Gordy CHRISTELLA Starch, MD 01/25/2024 3:20:57 PM This report has been signed electronically.

## 2024-01-25 NOTE — Patient Instructions (Signed)
Discharge instructions given. Handout on Diverticulosis. Resume previous medications. YOU HAD AN ENDOSCOPIC PROCEDURE TODAY AT THE [facility]:   Refer to the procedure report that was given to you for any specific questions about what was found during the examination.  If the procedure report does not answer your questions, please call your gastroenterologist to clarify.  If you requested that your care partner not be given the details of your procedure findings, then the procedure report has been included in a sealed envelope for you to review at your convenience later.  YOU SHOULD EXPECT: Some feelings of bloating in the abdomen. Passage of more gas than usual.  Walking can help get rid of the air that was put into your GI tract during the procedure and reduce the bloating. If you had a lower endoscopy (such as a colonoscopy or flexible sigmoidoscopy) you may notice spotting of blood in your stool or on the toilet paper. If you underwent a bowel prep for your procedure, you may not have a normal bowel movement for a few days.  Please Note:  You might notice some irritation and congestion in your nose or some drainage.  This is from the oxygen used during your procedure.  There is no need for concern and it should clear up in a day or so.  SYMPTOMS TO REPORT IMMEDIATELY:  Following lower endoscopy (colonoscopy or flexible sigmoidoscopy):  Excessive amounts of blood in the stool  Significant tenderness or worsening of abdominal pains  Swelling of the abdomen that is new, acute  Fever of 100F or higher   For urgent or emergent issues, a gastroenterologist can be reached at any hour by calling [phone]. Do not use MyChart messaging for urgent concerns.    DIET:  We do recommend a small meal at first, but then you may proceed to your regular diet.  Drink plenty of fluids but you should avoid alcoholic beverages for 24 hours.  ACTIVITY:  You should plan to take it easy for  the rest of today and you should NOT DRIVE or use heavy machinery until tomorrow (because of the sedation medicines used during the test).    FOLLOW UP: Our staff will call the number listed on your records the next business day following your procedure.  We will call around 7:15- 8:00 am to check on you and address any questions or concerns that you may have regarding the information given to you following your procedure. If we do not reach you, we will leave a message.     If any biopsies were taken you will be contacted by phone or by letter within the next 1-3 weeks.  Please call us at [phone] if you have not heard about the biopsies in 3 weeks.    SIGNATURES/CONFIDENTIALITY: You and/or your care partner have signed paperwork which will be entered into your electronic medical record.  These signatures attest to the fact that that the information above on your After Visit Summary has been reviewed and is understood.  Full responsibility of the confidentiality of this discharge information lies with you and/or your care-partner.  

## 2024-01-25 NOTE — Progress Notes (Signed)
 Report given to PACU, vss

## 2024-01-25 NOTE — Progress Notes (Signed)
 GASTROENTEROLOGY PROCEDURE H&P NOTE   Primary Care Physician: Shelda Atlas, MD    Reason for Procedure:  Rectal bleeding, history of colonic polyps  Plan:    Colonoscopy  Patient is appropriate for endoscopic procedure(s) in the ambulatory ([facility]) setting.  The nature of the procedure, as well as the risks, benefits, and alternatives were carefully and thoroughly reviewed with the patient. Ample time for discussion and questions allowed.  All questions were answered. The patient understood, was satisfied, and agreed with the plan to proceed.    HPI: Cristian Welch is a 63 y.o. male who presents for colonoscopy.  Medical history as below.  Tolerated the prep.  No recent chest pain or shortness of breath.  No abdominal pain today.  Past Medical History:  Diagnosis Date   Allergic rhinitis    Arthritis    At risk for sleep apnea    STOP-BANG= 4    SENT TO PCP 11-28-2013   Exercise-induced asthma    GERD (gastroesophageal reflux disease)    Glaucoma    History of MRSA infection    05/ 2014--  POSITIVE MRSA CULTURE SCROTAL ABSCESS   Prostate cancer [facility])    s/p  prostatectomy 08/ 2014   Skin tag of anus    Type 2 diabetes mellitus (HCC)     Past Surgical History:  Procedure Laterality Date   CIRCUMCISION N/A 08/04/2012   Procedure: CIRCUMCISION ADULT;  Surgeon: Toribio Neysa Repine, MD;  Location: [facility];  Service: Urology;  Laterality: N/A;  90 MIN ALSO PROSTATE NERVE BLOCK    EXCISION OF SKIN TAG N/A 12/01/2013   Procedure: EXCISION OF ANAL SKIN TAG;  Surgeon: Bernarda Ned, MD;  Location: [facility];  Service: General;  Laterality: N/A;   FOOT SURGERY Left 1994 (approx)   INGUINAL HERNIA REPAIR Right 2000 (approx)   IRRIGATION AND DEBRIDEMENT ABSCESS Right 07/07/2012   Procedure: IRRIGATION AND DEBRIDEMENT ABSCESS;  Surgeon: Toribio Neysa Repine, MD;  Location: [facility];  Service: Urology;  Laterality: Right;  and  scrotal abcesses   LYMPHADENECTOMY Bilateral 10/06/2012   Procedure: WITH BILATERAL PELVIC LYMPH NODE DISSECTION ;  Surgeon: Toribio Neysa Repine, MD;  Location: [facility];  Service: Urology;  Laterality: Bilateral;   PROSTATE BIOPSY N/A 08/04/2012   Procedure: BIOPSY TRANSRECTAL ULTRASONIC PROSTATE (TUBP);  Surgeon: Toribio Neysa Repine, MD;  Location: [facility];  Service: Urology;  Laterality: N/A;   ROBOT ASSISTED LAPAROSCOPIC RADICAL PROSTATECTOMY N/A 10/06/2012   Procedure: ROBOTIC ASSISTED LAPAROSCOPIC RADICAL PROSTATECTOMY LEVEL 2;  Surgeon: Toribio Neysa Repine, MD;  Location: [facility];  Service: Urology;  Laterality: N/A;   SHOULDER ARTHROSCOPY Left    TRANSTHORACIC ECHOCARDIOGRAM  06/06/2009   NORMAL LVF AND LVSF/ EF 55-60%    Prior to Admission medications   Medication Sig Start Date End Date Taking? Authorizing Provider  ACCU-CHEK GUIDE test strip  11/07/19  Yes [provider]  amitriptyline (ELAVIL) 25 MG tablet Take 25 mg by mouth at bedtime as needed. 10/07/22  Yes [provider]  atorvastatin (LIPITOR) 10 MG tablet Take 10 mg by mouth at bedtime.   Yes [provider]  AZOPT 1 % ophthalmic suspension INSTILL 1 DROP INTO AFFECTED EYE(S) THREE TIMES DAILY 10/21/17  Yes [provider]  celecoxib  (CELEBREX ) 200 MG capsule Take 1 capsule (200 mg total) by mouth 2 (two) times daily. 11/06/22  Yes Harris, Abigail, PA-C  cyclobenzaprine  (FLEXERIL ) 10 MG tablet Take 1 tablet (10 mg  total) by mouth 2 (two) times daily as needed for muscle spasms. 02/10/18  Yes McDonald, Mia A, PA-C  dapagliflozin propanediol (FARXIGA) 10 MG TABS tablet Take 10 mg by mouth daily.   Yes [provider]  DORZOLAMIDE  HCL OP Apply 1 drop to eye 3 (three) times daily.   Yes [provider]  glimepiride (AMARYL) 4 MG tablet Take 4 mg by mouth 2 (two) times daily. 04/02/18  Yes [provider]  latanoprost (XALATAN) 0.005 % ophthalmic  solution 1 drop at bedtime. 08/19/22  Yes [provider]  LUMIGAN 0.01 % SOLN INSTILL 1 DROP INTO EACH EYE IN THE EVENING SEPARATE BY AT LEAST 10 MINUTES FROM OTHER INTRAOCULAR PRESSURE REDUCING OPHTHALMIC DRUGS 07/24/18  Yes [provider]  metFORMIN  (GLUCOPHAGE ) 1000 MG tablet Take 1,000 mg by mouth 2 (two) times daily. 10/26/17  Yes [provider]  tiZANidine (ZANAFLEX) 4 MG tablet Take 4 mg by mouth 2 (two) times daily as needed. 02/24/18  Yes [provider]  travoprost , benzalkonium, (TRAVATAN ) 0.004 % ophthalmic solution Place 1 drop into both eyes at bedtime.   Yes [provider]  acetaminophen  (TYLENOL ) 500 MG tablet Take 2 tablets (1,000 mg total) by mouth every 6 (six) hours as needed. 02/10/18   McDonald, Mia A, PA-C  albuterol  (PROVENTIL  HFA;VENTOLIN  HFA) 108 (90 BASE) MCG/ACT inhaler Inhale 2 puffs into the lungs every 6 (six) hours as needed for wheezing.    [provider]  AMBULATORY NON FORMULARY MEDICATION Medication Name: Diltiazem 2%/Lidocaine  2% Using your index finger apply a small amount of medication inside the anal opening and to the external anal area twice daily x 6 weeks. Patient not taking: Reported on 01/25/2024 11/20/23   Craig Alan SAUNDERS, PA-C  cetirizine (ZYRTEC) 10 MG tablet Take 10 mg by mouth daily.    [provider]  fluticasone  (FLONASE ) 50 MCG/ACT nasal spray Place 1 spray into both nostrils daily. Patient not taking: No sig reported 06/12/17   Nivia Colon, PA-C  guaiFENesin  (MUCINEX ) 600 MG 12 hr tablet Take 1 tablet (600 mg total) by mouth 2 (two) times daily. 05/19/20   Couture, Cortni S, PA-C  HYDROcodone -acetaminophen  (NORCO) 5-325 MG tablet Take 1 tablet by mouth every 6 (six) hours as needed for moderate pain or severe pain. Patient not taking: Reported on 12/18/2023 11/06/22   Harris, Abigail, PA-C  methocarbamol  (ROBAXIN ) 500 MG tablet Take 1 tablet (500 mg total) by mouth every 8 (eight) hours  as needed for muscle spasms. Patient not taking: Reported on 01/25/2024 01/23/18   Theadore Ozell HERO, MD  propranolol (INDERAL) 10 MG tablet SMARTSIG:1 Tablet(s) By Mouth Every Evening Patient not taking: Reported on 01/25/2024 09/01/19   [provider]  sildenafil (VIAGRA) 100 MG tablet Take 100 mg by mouth daily as needed. 08/08/22   [provider]  sodium chloride  (OCEAN) 0.65 % SOLN nasal spray Place 1 spray into both nostrils as needed for congestion. Patient not taking: Reported on 01/25/2024 05/19/20   Couture, Cortni S, PA-C  tamsulosin (FLOMAX) 0.4 MG CAPS capsule Take 0.4 mg by mouth at bedtime. 07/30/21   [provider]  Vitamin D, Ergocalciferol, (DRISDOL) 1.25 MG (50000 UNIT) CAPS capsule Take 50,000 Units by mouth once a week. 09/14/19   [provider]    Current Outpatient Medications  Medication Sig Dispense Refill   ACCU-CHEK GUIDE test strip      amitriptyline (ELAVIL) 25 MG tablet Take 25 mg by mouth at bedtime as  needed.     atorvastatin (LIPITOR) 10 MG tablet Take 10 mg by mouth at bedtime.     AZOPT 1 % ophthalmic suspension INSTILL 1 DROP INTO AFFECTED EYE(S) THREE TIMES DAILY  5   celecoxib  (CELEBREX ) 200 MG capsule Take 1 capsule (200 mg total) by mouth 2 (two) times daily. 20 capsule 0   cyclobenzaprine  (FLEXERIL ) 10 MG tablet Take 1 tablet (10 mg total) by mouth 2 (two) times daily as needed for muscle spasms. 20 tablet 0   dapagliflozin propanediol (FARXIGA) 10 MG TABS tablet Take 10 mg by mouth daily.     DORZOLAMIDE  HCL OP Apply 1 drop to eye 3 (three) times daily.     glimepiride (AMARYL) 4 MG tablet Take 4 mg by mouth 2 (two) times daily.     latanoprost (XALATAN) 0.005 % ophthalmic solution 1 drop at bedtime.     LUMIGAN 0.01 % SOLN INSTILL 1 DROP INTO EACH EYE IN THE EVENING SEPARATE BY AT LEAST 10 MINUTES FROM OTHER INTRAOCULAR PRESSURE REDUCING OPHTHALMIC DRUGS     metFORMIN  (GLUCOPHAGE ) 1000 MG tablet Take 1,000 mg by mouth 2  (two) times daily.  5   tiZANidine (ZANAFLEX) 4 MG tablet Take 4 mg by mouth 2 (two) times daily as needed.     travoprost , benzalkonium, (TRAVATAN ) 0.004 % ophthalmic solution Place 1 drop into both eyes at bedtime.     acetaminophen  (TYLENOL ) 500 MG tablet Take 2 tablets (1,000 mg total) by mouth every 6 (six) hours as needed. 30 tablet 0   albuterol  (PROVENTIL  HFA;VENTOLIN  HFA) 108 (90 BASE) MCG/ACT inhaler Inhale 2 puffs into the lungs every 6 (six) hours as needed for wheezing.     AMBULATORY NON FORMULARY MEDICATION Medication Name: Diltiazem 2%/Lidocaine  2% Using your index finger apply a small amount of medication inside the anal opening and to the external anal area twice daily x 6 weeks. (Patient not taking: Reported on 01/25/2024) 30 g 1   cetirizine (ZYRTEC) 10 MG tablet Take 10 mg by mouth daily.     fluticasone  (FLONASE ) 50 MCG/ACT nasal spray Place 1 spray into both nostrils daily. (Patient not taking: No sig reported) 16 g 0   guaiFENesin  (MUCINEX ) 600 MG 12 hr tablet Take 1 tablet (600 mg total) by mouth 2 (two) times daily. 6 tablet 0   HYDROcodone -acetaminophen  (NORCO) 5-325 MG tablet Take 1 tablet by mouth every 6 (six) hours as needed for moderate pain or severe pain. (Patient not taking: Reported on 12/18/2023) 12 tablet 0   methocarbamol  (ROBAXIN ) 500 MG tablet Take 1 tablet (500 mg total) by mouth every 8 (eight) hours as needed for muscle spasms. (Patient not taking: Reported on 01/25/2024) 30 tablet 0   propranolol (INDERAL) 10 MG tablet SMARTSIG:1 Tablet(s) By Mouth Every Evening (Patient not taking: Reported on 01/25/2024)     sildenafil (VIAGRA) 100 MG tablet Take 100 mg by mouth daily as needed.     sodium chloride  (OCEAN) 0.65 % SOLN nasal spray Place 1 spray into both nostrils as needed for congestion. (Patient not taking: Reported on 01/25/2024) 30 mL 0   tamsulosin (FLOMAX) 0.4 MG CAPS capsule Take 0.4 mg by mouth at bedtime.     Vitamin D, Ergocalciferol, (DRISDOL) 1.25  MG (50000 UNIT) CAPS capsule Take 50,000 Units by mouth once a week.     Current Facility-Administered Medications  Medication Dose Route Frequency Provider Last Rate Last Admin   0.9 %  sodium chloride  infusion  500 mL Intravenous Once Burdett Pinzon,  Gordy HERO, MD       methylPREDNISolone  acetate (DEPO-MEDROL ) injection 40 mg  40 mg Intra-articular Once Hilts, Ozell, MD        Allergies as of 01/25/2024 - Review Complete 01/25/2024  Allergen Reaction Noted   Cephalexin Rash 07/05/2012    Family History  Problem Relation Age of Onset   Colon cancer Neg Hx    Esophageal cancer Neg Hx    Stomach cancer Neg Hx    Rectal cancer Neg Hx     Social History   Socioeconomic History   Marital status: Single    Spouse name: Not on file   Number of children: Not on file   Years of education: Not on file   Highest education level: Not on file  Occupational History   Not on file  Tobacco Use   Smoking status: Former    Current packs/day: 0.00    Average packs/day: 0.3 packs/day for 20.0 years (5.0 ttl pk-yrs)    Types: Cigarettes    Start date: 02/24/1982    Quit date: 02/24/2002    Years since quitting: 21.9   Smokeless tobacco: Never  Vaping Use   Vaping status: Never Used  Substance and Sexual Activity   Alcohol use: No   Drug use: No    Types: Cocaine, Marijuana, LSD    Comment: last used (approx.  2000)   Sexual activity: Not on file  Other Topics Concern   Not on file  Social History Narrative   Not on file   Social Drivers of Health   Financial Resource Strain: Not on file  Food Insecurity: Not on file  Transportation Needs: Not on file  Physical Activity: Not on file  Stress: Not on file  Social Connections: Not on file  Intimate Partner Violence: Not on file    Physical Exam: Vital signs in last 24 hours: @BP  (!) 152/86   Pulse 92   Temp (!) 97.2 F (36.2 C)   Ht 6' (1.829 m)   Wt 207 lb (93.9 kg)   SpO2 98%   BMI 28.07 kg/m  GEN: NAD EYE: Sclerae  anicteric ENT: MMM CV: Non-tachycardic Pulm: CTA b/l GI: Soft, NT/ND NEURO:  Alert & Oriented x 3   Gordy Starch, MD [facility]  01/25/2024 2:45 PM

## 2024-01-26 ENCOUNTER — Telehealth: Payer: Self-pay | Admitting: *Deleted

## 2024-01-26 NOTE — Telephone Encounter (Signed)
  Follow up Call-     01/25/2024    2:26 PM  Call back number  Post procedure Call Back phone  # [phone]  Permission to leave phone message Yes     Patient questions:  Do you have a fever, pain , or abdominal swelling? No. Pain Score  0 *  Have you tolerated food without any problems? Yes.    Have you been able to return to your normal activities? Yes.    Do you have any questions about your discharge instructions: Diet   No. Medications  No. Follow up visit  No.  Do you have questions or concerns about your Care? No.  Actions: * If pain score is 4 or above: No action needed, pain <4.

## 2024-02-08 ENCOUNTER — Encounter: Payer: Self-pay | Admitting: Podiatry

## 2024-02-08 ENCOUNTER — Ambulatory Visit: Admitting: Podiatry

## 2024-02-08 DIAGNOSIS — B351 Tinea unguium: Secondary | ICD-10-CM | POA: Diagnosis not present

## 2024-02-08 DIAGNOSIS — E119 Type 2 diabetes mellitus without complications: Secondary | ICD-10-CM

## 2024-02-08 DIAGNOSIS — M79674 Pain in right toe(s): Secondary | ICD-10-CM | POA: Diagnosis not present

## 2024-02-08 DIAGNOSIS — L84 Corns and callosities: Secondary | ICD-10-CM

## 2024-02-08 DIAGNOSIS — M79675 Pain in left toe(s): Secondary | ICD-10-CM

## 2024-02-08 NOTE — Progress Notes (Signed)
 This patient returns to my office for at risk foot care.  This patient requires this care by a professional since this patient will be at risk due to having type 2 diabetes.  This patient is unable to cut nails himself since the patient cannot reach his nails.These nails are painful walking and wearing shoes.  This patient presents for at risk foot care today.  General Appearance  Alert, conversant and in no acute stress.  Vascular  Dorsalis pedis and posterior tibial  pulses are palpable  bilaterally.  Capillary return is within normal limits  bilaterally. Temperature is within normal limits  bilaterally.  Neurologic  Senn-Weinstein monofilament wire test within normal limits  bilaterally. Muscle power within normal limits bilaterally.  Nails Thick disfigured discolored nails with subungual debris  from hallux to fifth toes bilaterally. No evidence of bacterial infection or drainage bilaterally.  Orthopedic  No limitations of motion  feet .  No crepitus or effusions noted.  No bony pathology or digital deformities noted.  Skin  normotropic skin with no porokeratosis noted bilaterally.  No signs of infections or ulcers noted.   Callus sub 3 left foot asymptomatic.  Onychomycosis  Pain in right toes  Pain in left toes    Consent was obtained for treatment procedures.   Mechanical debridement of nails 1-5  bilaterally performed with a nail nipper.  Filed with dremel without incident. Debride callus with dremel tool.   Return office visit     3   months                Told patient to return for periodic foot care and evaluation due to potential at risk complications.   Helane Gunther DPM

## 2024-05-09 ENCOUNTER — Ambulatory Visit: Admitting: Podiatry
